# Patient Record
Sex: Male | Born: 1985 | Race: White | Hispanic: No | Marital: Single | Smoking: Current every day smoker
Health system: Southern US, Community
[De-identification: ages and names within clinical notes are randomized; demographics above are authoritative.]

## PROBLEM LIST (undated history)

## (undated) DIAGNOSIS — F329 Major depressive disorder, single episode, unspecified: Secondary | ICD-10-CM

## (undated) DIAGNOSIS — F32A Depression, unspecified: Secondary | ICD-10-CM

## (undated) DIAGNOSIS — F419 Anxiety disorder, unspecified: Secondary | ICD-10-CM

## (undated) DIAGNOSIS — F319 Bipolar disorder, unspecified: Secondary | ICD-10-CM

## (undated) DIAGNOSIS — M069 Rheumatoid arthritis, unspecified: Secondary | ICD-10-CM

## (undated) DIAGNOSIS — F4310 Post-traumatic stress disorder, unspecified: Secondary | ICD-10-CM

---

## 2009-05-05 ENCOUNTER — Emergency Department (HOSPITAL_COMMUNITY): Admission: EM | Admit: 2009-05-05 | Discharge: 2009-05-05 | Payer: Self-pay | Admitting: Emergency Medicine

## 2009-05-24 ENCOUNTER — Emergency Department (HOSPITAL_COMMUNITY): Admission: EM | Admit: 2009-05-24 | Discharge: 2009-05-24 | Payer: Self-pay | Admitting: Emergency Medicine

## 2009-05-27 ENCOUNTER — Emergency Department (HOSPITAL_COMMUNITY): Admission: EM | Admit: 2009-05-27 | Discharge: 2009-05-27 | Payer: Self-pay | Admitting: Emergency Medicine

## 2009-09-15 ENCOUNTER — Emergency Department (HOSPITAL_COMMUNITY): Admission: EM | Admit: 2009-09-15 | Discharge: 2009-09-15 | Payer: Self-pay | Admitting: Emergency Medicine

## 2010-12-01 LAB — COMPREHENSIVE METABOLIC PANEL
ALT: 16 U/L (ref 0–53)
Alkaline Phosphatase: 57 U/L (ref 39–117)
BUN: 11 mg/dL (ref 6–23)
CO2: 28 mEq/L (ref 19–32)
Chloride: 104 mEq/L (ref 96–112)
GFR calc non Af Amer: >60 mL/min (ref >60)
Glucose, Bld: 112 mg/dL — ABNORMAL HIGH (ref 70–99)
Potassium: 4.1 mEq/L (ref 3.5–5.1)
Sodium: 137 mEq/L (ref 135–145)
Total Bilirubin: 0.9 mg/dL (ref 0.3–1.2)

## 2010-12-01 LAB — DIFFERENTIAL
Eosinophils Relative: 2 % (ref 0–5)
Lymphocytes Relative: 24 % (ref 12–46)
Lymphs Abs: 2.2 10*3/uL (ref 0.7–4.0)
Monocytes Relative: 9 % (ref 3–12)
Neutrophils Relative %: 65 % (ref 43–77)

## 2010-12-01 LAB — URINE MICROSCOPIC-ADD ON

## 2010-12-01 LAB — CBC
HCT: 46 % (ref 39.0–52.0)
MCV: 94.7 fL (ref 78.0–100.0)
Platelets: 184 10*3/uL (ref 150–400)
RBC: 4.86 MIL/uL (ref 4.22–5.81)
WBC: 9.3 10*3/uL (ref 4.0–10.5)

## 2010-12-01 LAB — URINALYSIS, ROUTINE W REFLEX MICROSCOPIC
Hgb urine dipstick: NEGATIVE
Nitrite: NEGATIVE
Protein, ur: NEGATIVE mg/dL
Urobilinogen, UA: 0.2 mg/dL (ref 0.0–1.0)

## 2010-12-28 IMAGING — CT CT HEAD W/O CM
1 of 3 series · 16 of 30 positions shown, 20 images · non-contrast
Comparison: None

CLINICAL DATA: Dizziness and syncope.  Possible seizure.

CT HEAD WITHOUT CONTRAST
TECHNIQUE: Contiguous axial images were obtained from the base of
the skull through the vertex without contrast.

[Series 4: headseq 2.4 h60s · axial · 0.43mm/px · z∈[-176,-28]mm · 16 of 72 slices shown, 20 images]
[im 5/72  brain]
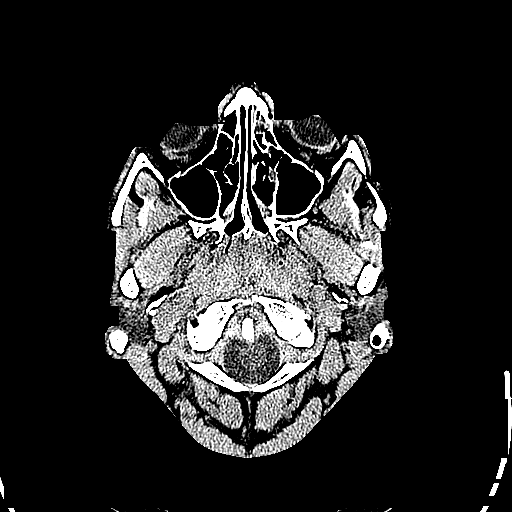
[im 5/72  bone]
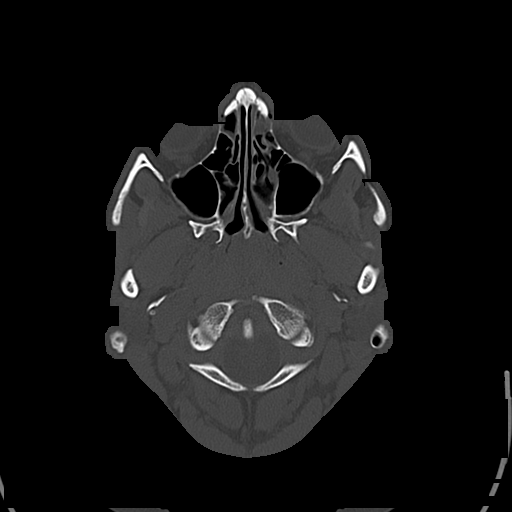
[im 9/72  brain]
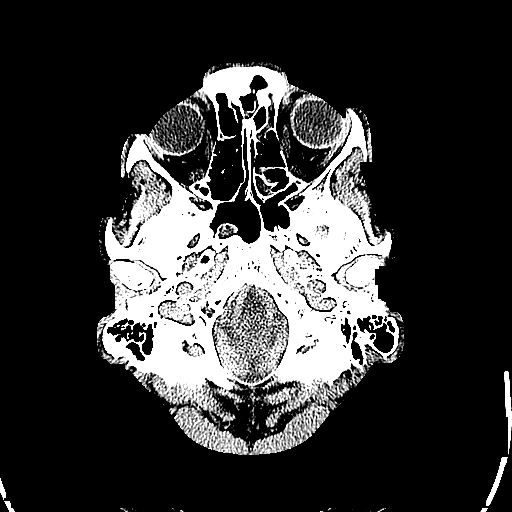
[im 13/72  brain]
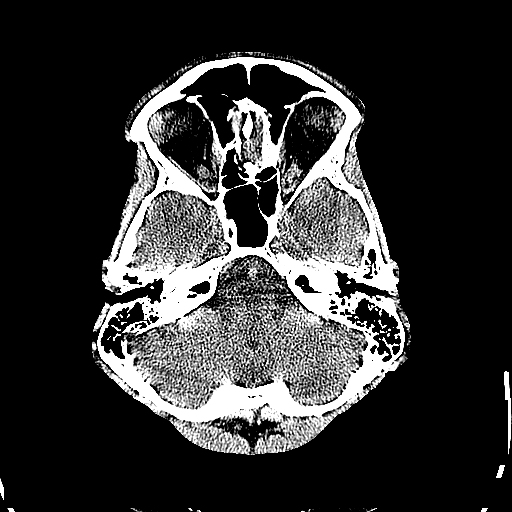
[im 17/72  brain]
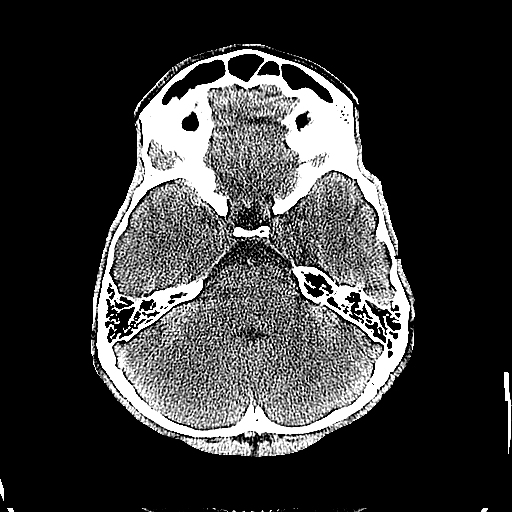
[im 21/72  brain]
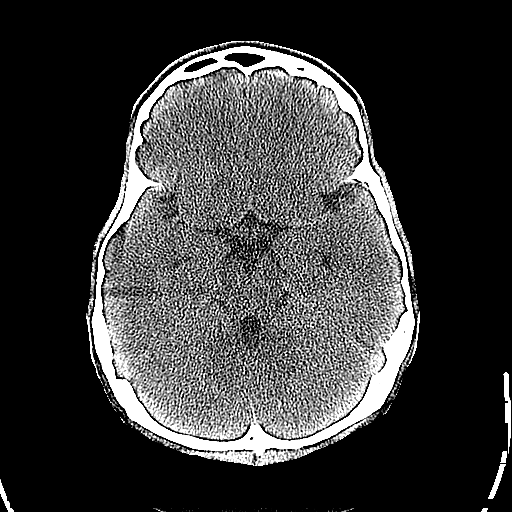
[im 21/72  bone]
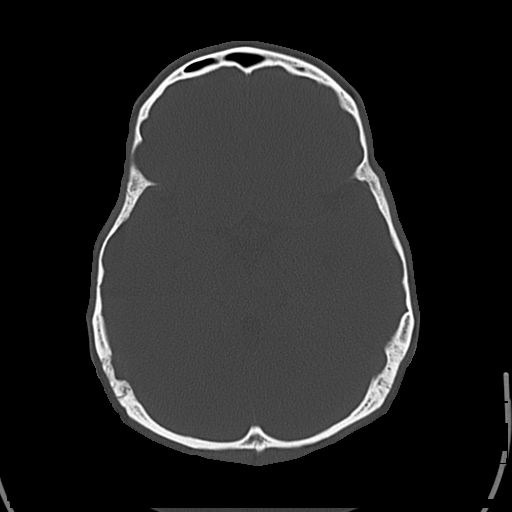
[im 26/72  brain]
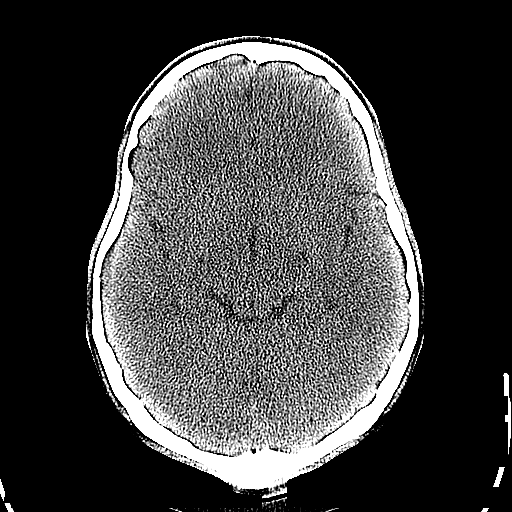
[im 30/72  brain]
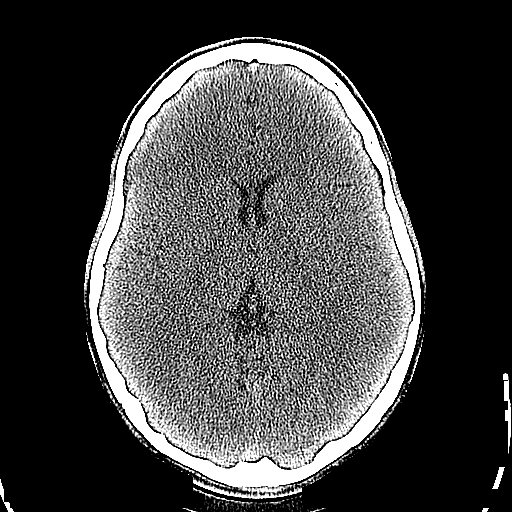
[im 34/72  brain]
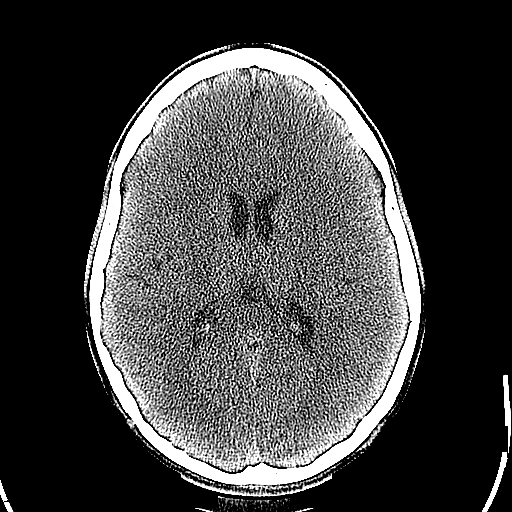
[im 38/72  brain]
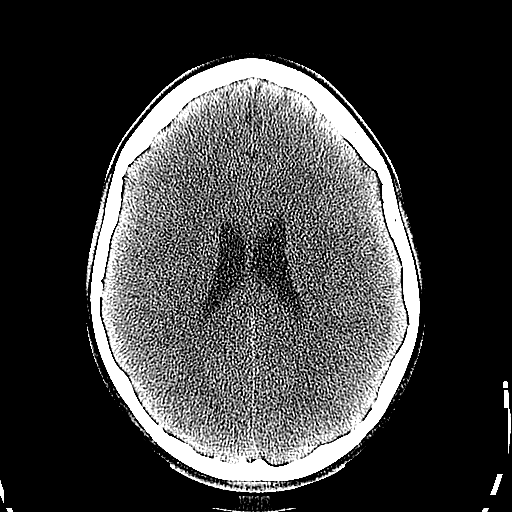
[im 38/72  bone]
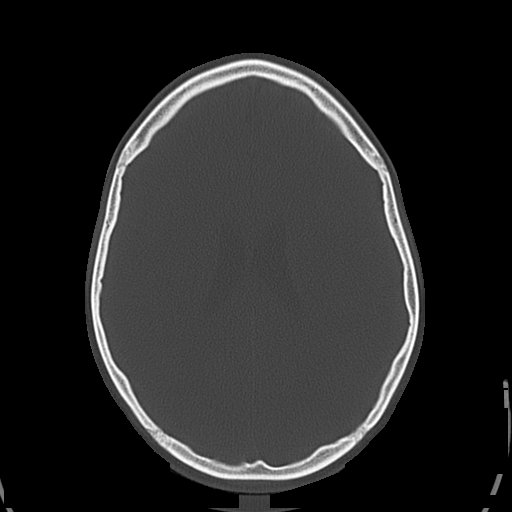
[im 42/72  brain]
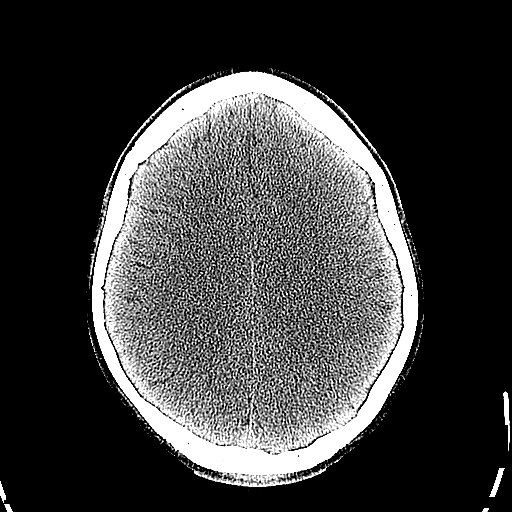
[im 46/72  brain]
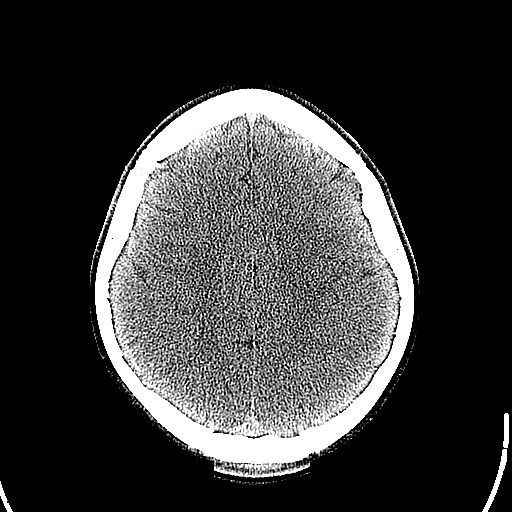
[im 51/72  brain]
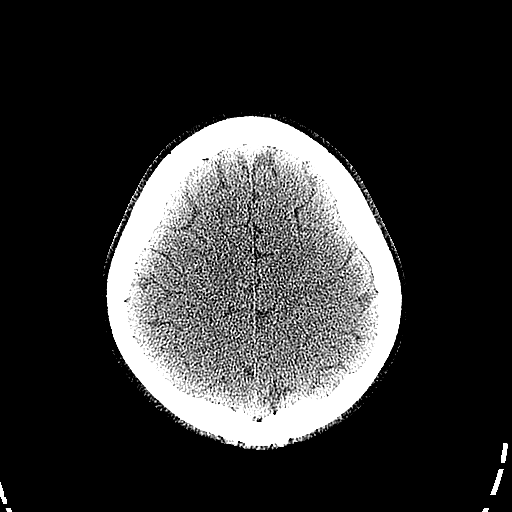
[im 55/72  brain]
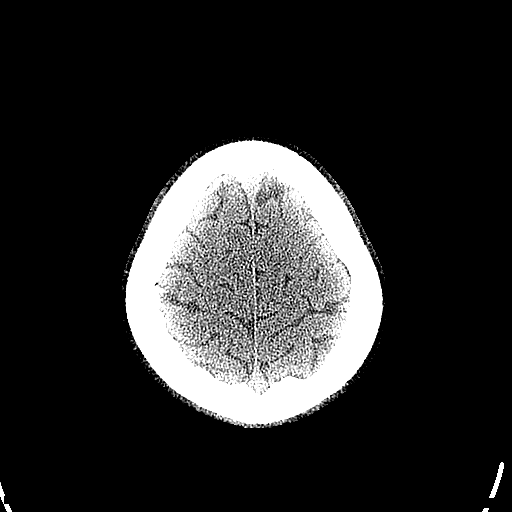
[im 55/72  bone]
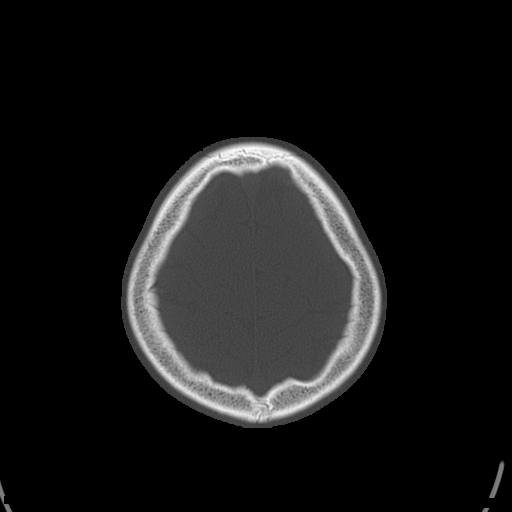
[im 59/72  brain]
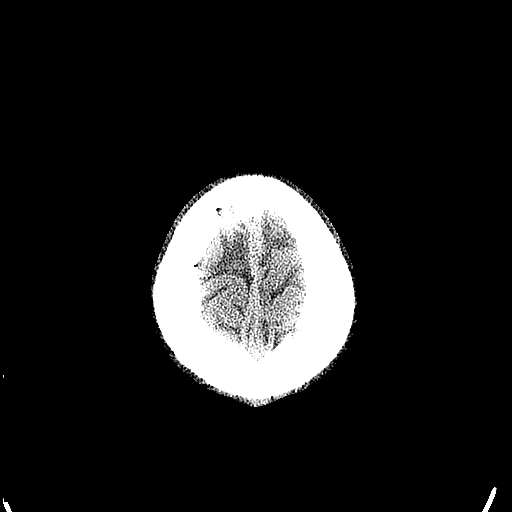
[im 63/72  brain]
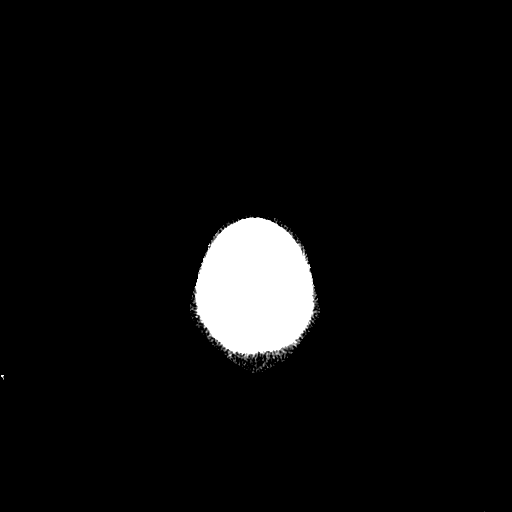
[im 67/72  brain]
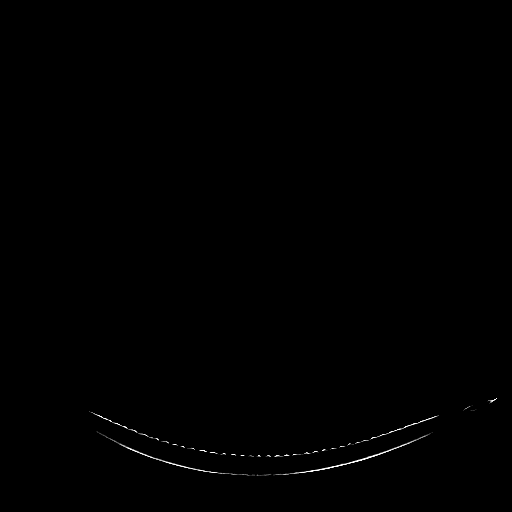

[16 of 30 positions shown; findings below may reference images not displayed]

FINDINGS: There is no evidence of acute intracranial hemorrhage,
mass lesion, brain edema or extra-axial fluid collection.  The
ventricles and subarachnoid spaces are appropriately sized for age.
There is no CT evidence of acute cortical infarction.

The visualized paranasal sinuses are clearaside from scattered
mucosal thickening in the ethmoid and right sphenoid sinuses.  The
calvarium is intact.
IMPRESSION: No acute intracranial findings.  Paranasal sinus mucosal
thickening.

## 2011-02-28 ENCOUNTER — Emergency Department (HOSPITAL_COMMUNITY)
Admission: EM | Admit: 2011-02-28 | Discharge: 2011-02-28 | Disposition: A | Discharge status: Home / Self Care | Payer: Self-pay | Attending: Emergency Medicine | Admitting: Emergency Medicine

## 2011-02-28 DIAGNOSIS — R599 Enlarged lymph nodes, unspecified: Secondary | ICD-10-CM | POA: Insufficient documentation

## 2011-02-28 DIAGNOSIS — K047 Periapical abscess without sinus: Secondary | ICD-10-CM | POA: Insufficient documentation

## 2012-03-20 ENCOUNTER — Emergency Department (HOSPITAL_COMMUNITY)
Admission: EM | Admit: 2012-03-20 | Discharge: 2012-03-20 | Disposition: A | Discharge status: Home / Self Care | Payer: Self-pay | Attending: Emergency Medicine | Admitting: Emergency Medicine

## 2012-03-20 ENCOUNTER — Encounter (HOSPITAL_COMMUNITY): Payer: Self-pay | Admitting: *Deleted

## 2012-03-20 DIAGNOSIS — F17200 Nicotine dependence, unspecified, uncomplicated: Secondary | ICD-10-CM | POA: Insufficient documentation

## 2012-03-20 DIAGNOSIS — F329 Major depressive disorder, single episode, unspecified: Secondary | ICD-10-CM

## 2012-03-20 DIAGNOSIS — X838XXA Intentional self-harm by other specified means, initial encounter: Secondary | ICD-10-CM

## 2012-03-20 DIAGNOSIS — R45851 Suicidal ideations: Secondary | ICD-10-CM

## 2012-03-20 DIAGNOSIS — F3289 Other specified depressive episodes: Secondary | ICD-10-CM

## 2012-03-20 DIAGNOSIS — M069 Rheumatoid arthritis, unspecified: Secondary | ICD-10-CM | POA: Insufficient documentation

## 2012-03-20 DIAGNOSIS — F32A Depression, unspecified: Secondary | ICD-10-CM

## 2012-03-20 HISTORY — DX: Rheumatoid arthritis, unspecified: M06.9

## 2012-03-20 HISTORY — DX: Anxiety disorder, unspecified: F41.9

## 2012-03-20 HISTORY — DX: Major depressive disorder, single episode, unspecified: F32.9

## 2012-03-20 HISTORY — DX: Depression, unspecified: F32.A

## 2012-03-20 LAB — URINALYSIS, ROUTINE W REFLEX MICROSCOPIC
Glucose, UA: NEGATIVE mg/dL
Hgb urine dipstick: NEGATIVE
Ketones, ur: NEGATIVE mg/dL
Leukocytes, UA: NEGATIVE
Nitrite: NEGATIVE
Protein, ur: NEGATIVE mg/dL
Specific Gravity, Urine: 1.031 — ABNORMAL HIGH (ref 1.005–1.030)
Urobilinogen, UA: 1 mg/dL (ref 0.0–1.0)
pH: 5.5 (ref 5.0–8.0)

## 2012-03-20 LAB — CBC
HCT: 48.4 % (ref 39.0–52.0)
Hemoglobin: 17 g/dL (ref 13.0–17.0)
MCH: 31.7 pg (ref 26.0–34.0)
MCHC: 35.1 g/dL (ref 30.0–36.0)
MCV: 90.1 fL (ref 78.0–100.0)
Platelets: 306 10*3/uL (ref 150–400)
RBC: 5.37 MIL/uL (ref 4.22–5.81)
RDW: 13.7 % (ref 11.5–15.5)
WBC: 8.3 10*3/uL (ref 4.0–10.5)

## 2012-03-20 LAB — BASIC METABOLIC PANEL
BUN: 10 mg/dL (ref 6–23)
CO2: 25 mEq/L (ref 19–32)
Calcium: 10.1 mg/dL (ref 8.4–10.5)
Chloride: 100 mEq/L (ref 96–112)
Creatinine, Ser: 0.97 mg/dL (ref 0.50–1.35)
GFR calc Af Amer: >90 mL/min (ref >90)
GFR calc non Af Amer: >90 mL/min (ref >90)
Glucose, Bld: 104 mg/dL — ABNORMAL HIGH (ref 70–99)
Potassium: 4.2 mEq/L (ref 3.5–5.1)
Sodium: 137 mEq/L (ref 135–145)

## 2012-03-20 LAB — RAPID URINE DRUG SCREEN, HOSP PERFORMED
Amphetamines: NOT DETECTED
Barbiturates: NOT DETECTED
Benzodiazepines: POSITIVE — AB
Cocaine: POSITIVE — AB
Opiates: NOT DETECTED
Tetrahydrocannabinol: POSITIVE — AB

## 2012-03-20 MED ORDER — LORAZEPAM 2 MG/ML IJ SOLN: 2.0000 mg | Freq: Once | INTRAMUSCULAR | Status: DC

## 2012-03-20 MED ORDER — ONDANSETRON HCL 4 MG PO TABS: 4.0000 mg | ORAL_TABLET | Freq: Three times a day (TID) | ORAL | Status: DC | PRN

## 2012-03-20 MED ORDER — NICOTINE 21 MG/24HR TD PT24
21.0000 mg | MEDICATED_PATCH | Freq: Every day | TRANSDERMAL | Status: DC
  Administered 2012-03-20: 21 mg via TRANSDERMAL
  Filled 2012-03-20: qty 1

## 2012-03-20 MED ORDER — ZOLPIDEM TARTRATE 5 MG PO TABS: 5.0000 mg | ORAL_TABLET | Freq: Every evening | ORAL | Status: DC | PRN

## 2012-03-20 MED ORDER — ACETAMINOPHEN 325 MG PO TABS: 650.0000 mg | ORAL_TABLET | ORAL | Status: DC | PRN

## 2012-03-20 MED ORDER — IBUPROFEN 600 MG PO TABS: 600.0000 mg | ORAL_TABLET | Freq: Three times a day (TID) | ORAL | Status: DC | PRN

## 2012-03-20 MED ORDER — LORAZEPAM 1 MG PO TABS
1.0000 mg | ORAL_TABLET | Freq: Three times a day (TID) | ORAL | Status: DC | PRN
  Administered 2012-03-20: 1 mg via ORAL
  Filled 2012-03-20: qty 1

## 2012-03-20 NOTE — ED Provider Notes (Signed)
History    26yM brought in by police. Got into argument with older brother and father today. Pt apparently threatened to kill himself and wrapped rope around neck. Father called police and they brought to ED. Pt denies this. Says no SI or HI. Says rope police brought with them was used to tie to lawnmower to keep the collection bag up. Hx of depression. Previously prescribed meds but has not been on in years because doesn't like the way it makes him feel. Previous care at [facility]. Very poor financial situation. Mother died in 17-Feb-2006. Occasional marijuana, otherwise denies drug use.   CSN: [id number]  Arrival date & time 03/20/12  4098   First MD Initiated Contact with Patient 03/20/12 [phone]      Chief Complaint  Patient presents with  . Medical Clearance    (Consider location/radiation/quality/duration/timing/severity/associated sxs/prior treatment) HPI  Past Medical History  Diagnosis Date  . Anxiety   . Depression   . Rheumatoid arthritis     History reviewed. No pertinent past surgical history.  No family history on file.  History  Substance Use Topics  . Smoking status: Current Everyday Smoker  . Smokeless tobacco: Not on file  . Alcohol Use: No      Review of Systems   Review of symptoms negative unless otherwise noted in HPI.  Allergies  Review of patient's allergies indicates no known allergies.  Home Medications  No current outpatient prescriptions on file.  BP 131/89  Pulse 87  Temp 98.4 F (36.9 C) (Oral)  Resp 18  SpO2 98%  Physical Exam  Nursing note and vitals reviewed. Constitutional: He appears well-developed and well-nourished. No distress.  HENT:  Head: Normocephalic and atraumatic.  Eyes: Conjunctivae are normal. Right eye exhibits no discharge. Left eye exhibits no discharge.  Neck: Neck supple.  Cardiovascular: Normal rate, regular rhythm and normal heart sounds.  Exam reveals no gallop and no friction rub.   No murmur  heard. Pulmonary/Chest: Effort normal and breath sounds normal. No respiratory distress.  Abdominal: Soft. He exhibits no distension. There is no tenderness.  Musculoskeletal: He exhibits no edema and no tenderness.  Neurological: He is alert.  Skin: Skin is warm and dry.  Psychiatric: His behavior is normal. Thought content normal.       Crying at times. Speech clear and content appropriate. Does not appear to be responding to internal stimuli. N apparent cognitive impairment.    ED Course  Procedures (including critical care time)   Labs Reviewed  CBC  BASIC METABOLIC PANEL  URINALYSIS, ROUTINE W REFLEX MICROSCOPIC  URINE RAPID DRUG SCREEN (HOSP PERFORMED)  ETHANOL   No results found.   1. Suicide gesture   2. Depression      MDM   Discussed with pt's father, Aser Nylund, with pt's permission([phone]). He states that he actually saw pt with rope around his neck. Police arrived with a rope fashioned in a noose that they found on scene.  Incident happened after a heated argument and suspect this was just an impulsive gesture.Despite pt's claims that no SI, concerning enough that pt will be placed under IVC at this time and will obtain psych eval.        Raeford Razor, MD 03/20/12 1023

## 2012-03-20 NOTE — Progress Notes (Signed)
Pt has been cleared by the unit psychiatrist for discharge home with outpatient resources. EDP was notified by RN who is in agreement with the disposition. CSW met with the pt to provide outpatient resources for substance abuse and mental health needs (resources included: [facility], mobile crisis services, therapists, [facility], [facility], [facility] and [facility]). No further needs identified at this time.

## 2012-03-20 NOTE — Consult Note (Signed)
Reason for Consult: Depression, and possible suicidal threat Referring Physician: Dr. Wyline Barker is an 26 y.o. male.  HPI: Patient was seen and chart reviewed. Patient was brought in by [facility] Department who for psychiatric evaluation. Reportedly patient threatened to kill himself and put a rope around his neck. Patient denies these accusations by his father. Patient had an argument and fight with his 58 years old Brother for gas money to go and get food pantry. Patient father pays all the bills and his medications from his disability check. Patient brother last his job during the month of March and he has been working part-time on and off for detail car cleaning and moving grass with friends. Patient reported that there is rope and noose used by a friend for mower. 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 Patient reported he has a family friend from  willing to help him, otherwise he is going to receive help from his the younger brother who was 57 years old, works in Psychologist, educational for the [employer]. Patient endorses being emotional, stressful and occasional use of drugs. He is willing to obtain psychosocial support from extended family and friends support.  Past Medical History  Diagnosis Date  . Anxiety   . Depression   . Rheumatoid arthritis     History reviewed. No pertinent past surgical history.  No family history on file.  Social History:  reports that he has been smoking.  He does not have any smokeless tobacco history on file. He reports that he uses illicit drugs (Marijuana). He reports that he does not drink alcohol.  Allergies: No Known Allergies  Medications: I have reviewed the patient's current medications.  Results for orders placed during the hospital encounter of 03/20/12 (from the past 48 hour(s))  CBC     Status: Normal   Collection Time   03/20/12  9:55 AM      Component Value Range Comment   WBC 8.3  4.0 - 10.5 K/uL    RBC 5.37  4.22 - 5.81 MIL/uL    Hemoglobin 17.0  13.0 - 17.0 g/dL    HCT 78.2  95.6 - 21.3 %    MCV 90.1  78.0 - 100.0 fL    MCH 31.7  26.0 - 34.0 pg    MCHC 35.1  30.0 - 36.0 g/dL    RDW 08.6  57.8 - 46.9 %    Platelets 306  150 - 400 K/uL   BASIC METABOLIC PANEL     Status: Abnormal   Collection Time   03/20/12  9:55 AM      Component Value Range Comment   Sodium 137  135 - 145 mEq/L    Potassium 4.2  3.5 - 5.1 mEq/L    Chloride 100  96 - 112 mEq/L    CO2 25  19 - 32 mEq/L    Glucose, Bld 104 (*) 70 - 99 mg/dL    BUN 10  6 - 23 mg/dL    Creatinine, Ser 6.29  0.50 - 1.35 mg/dL    Calcium 52.8  8.4 - 10.5 mg/dL    GFR calc non Af Amer >90  >90 mL/min    GFR calc Af Amer >90  >90 mL/min   URINALYSIS, ROUTINE W REFLEX MICROSCOPIC     Status: Abnormal   Collection Time   03/20/12 10:27 AM      Component Value  Range Comment   Color, Urine AMBER (*) YELLOW BIOCHEMICALS MAY BE AFFECTED BY COLOR   APPearance CLEAR  CLEAR    Specific Gravity, Urine 1.031 (*) 1.005 - 1.030    pH 5.5  5.0 - 8.0    Glucose, UA NEGATIVE  NEGATIVE mg/dL    Hgb urine dipstick NEGATIVE  NEGATIVE    Bilirubin Urine SMALL (*) NEGATIVE    Ketones, ur NEGATIVE  NEGATIVE mg/dL    Protein, ur NEGATIVE  NEGATIVE mg/dL    Urobilinogen, UA 1.0  0.0 - 1.0 mg/dL    Nitrite NEGATIVE  NEGATIVE    Leukocytes, UA NEGATIVE  NEGATIVE MICROSCOPIC NOT DONE ON URINES WITH NEGATIVE PROTEIN, BLOOD, LEUKOCYTES, NITRITE, OR GLUCOSE <1000 mg/dL.  URINE RAPID DRUG SCREEN (HOSP PERFORMED)     Status: Abnormal   Collection Time   03/20/12 10:27 AM      Component Value Range Comment   Opiates NONE DETECTED  NONE DETECTED    Cocaine POSITIVE (*) NONE DETECTED    Benzodiazepines POSITIVE (*) NONE DETECTED    Amphetamines NONE DETECTED  NONE DETECTED    Tetrahydrocannabinol POSITIVE (*) NONE DETECTED    Barbiturates NONE DETECTED  NONE DETECTED     ETHANOL     Status: Normal   Collection Time   03/20/12 12:15 PM      Component Value Range Comment   Alcohol, Ethyl (B) <11  0 - 11 mg/dL     No results found.  No psychosis and Positive for anxiety, bad Barker, depression and illegal drug usage Blood pressure 125/83, pulse 58, temperature 97.6 F (36.4 C), temperature source Oral, resp. rate 18, SpO2 100.00%.   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 Sibling relationship problems  Recommended outpatient psychiatric services and patient does not meet criteria for acute psychiatric hospitalization to get his does not current medications and the no recommendation was given during this visit  Steven Barker,JANARDHAHA R. 03/20/2012, 5:30 PM

## 2012-03-20 NOTE — ED Notes (Signed)
[employer] brought in noose that family reports pt tied to deck in an attempt to hang himself. Given to Juleen China, EDP, at bedside.

## 2012-03-20 NOTE — Progress Notes (Signed)
Initial encounter with pt. On nursing referral.    Pt lying in bed, lethargic.  Welcoming of chaplain presence.    Spoke with chaplain about desire to be discharged from ED.  When Chaplain asked pt about plans for discharge, Pt reported he had arranged for family friend to come from [place] to take pt to "a more peaceful, less stressful place."  Pt reported that he does not desire to harm himself.    Pt spoke with chaplain about unhappiness living in [place] and only staying to care for his grandmother, who he reports needs assistance with meals and daily living.  Pt reports that stresses in his home environment are great enough that he needs to leave and "they will have to find someone to help with that" (grandmother).    Belva Crome  MDiv, Chaplain    03/20/12 1400  Clinical Encounter Type  Visited With Patient  Visit Type Initial;Psychological support;Social support;Spiritual support;ED;Behavioral Health  Referral From Nurse  Consult/Referral To Nurse  Spiritual Encounters  Spiritual Needs Emotional  Stress Factors  Patient Stress Factors Family relationships

## 2012-03-20 NOTE — ED Notes (Signed)
Pt in by [employer], in handcuffs. Reports he got into an argument with his dad and brother over money and "they called the cops." Father reported to [employer] that pt was suicidal and tried to tie a rope around his neck and hang self from the deck. [employer] reports pt ran away before they arrived on scene so they did not witness this. Pt denies that that ever happened. "they have the wrong person, my dad and brother should be in here. I am not suicidal, do not want to hurt myself or others." Pt reports he used to go to [place] and has a Hx of SI.

## 2013-05-15 ENCOUNTER — Inpatient Hospital Stay (HOSPITAL_COMMUNITY)
Admission: AD | Admit: 2013-05-15 | Discharge: 2013-05-20 | DRG: 897 | Disposition: A | Discharge status: Home / Self Care | Payer: Federal, State, Local not specified - Other | Source: Intra-hospital | Attending: Psychiatry | Admitting: Psychiatry

## 2013-05-15 ENCOUNTER — Emergency Department (HOSPITAL_COMMUNITY)
Admission: EM | Admit: 2013-05-15 | Discharge: 2013-05-15 | Disposition: A | Discharge status: Other Acute Inpatient Hospital | Payer: Self-pay | Attending: Emergency Medicine | Admitting: Emergency Medicine

## 2013-05-15 ENCOUNTER — Encounter (HOSPITAL_COMMUNITY): Payer: Self-pay

## 2013-05-15 ENCOUNTER — Encounter (HOSPITAL_COMMUNITY): Payer: Self-pay | Admitting: Emergency Medicine

## 2013-05-15 DIAGNOSIS — F32A Depression, unspecified: Secondary | ICD-10-CM

## 2013-05-15 DIAGNOSIS — F323 Major depressive disorder, single episode, severe with psychotic features: Secondary | ICD-10-CM | POA: Diagnosis present

## 2013-05-15 DIAGNOSIS — R630 Anorexia: Secondary | ICD-10-CM | POA: Insufficient documentation

## 2013-05-15 DIAGNOSIS — F14120 Cocaine abuse with intoxication, uncomplicated: Secondary | ICD-10-CM

## 2013-05-15 DIAGNOSIS — F411 Generalized anxiety disorder: Secondary | ICD-10-CM | POA: Diagnosis present

## 2013-05-15 DIAGNOSIS — R45851 Suicidal ideations: Secondary | ICD-10-CM

## 2013-05-15 DIAGNOSIS — M083 Juvenile rheumatoid polyarthritis (seronegative): Secondary | ICD-10-CM | POA: Insufficient documentation

## 2013-05-15 DIAGNOSIS — G4700 Insomnia, unspecified: Secondary | ICD-10-CM | POA: Insufficient documentation

## 2013-05-15 DIAGNOSIS — F17200 Nicotine dependence, unspecified, uncomplicated: Secondary | ICD-10-CM | POA: Diagnosis present

## 2013-05-15 DIAGNOSIS — F333 Major depressive disorder, recurrent, severe with psychotic symptoms: Secondary | ICD-10-CM

## 2013-05-15 DIAGNOSIS — M069 Rheumatoid arthritis, unspecified: Secondary | ICD-10-CM | POA: Diagnosis present

## 2013-05-15 DIAGNOSIS — F3289 Other specified depressive episodes: Secondary | ICD-10-CM | POA: Insufficient documentation

## 2013-05-15 DIAGNOSIS — M0800 Unspecified juvenile rheumatoid arthritis of unspecified site: Secondary | ICD-10-CM

## 2013-05-15 DIAGNOSIS — F329 Major depressive disorder, single episode, unspecified: Secondary | ICD-10-CM

## 2013-05-15 DIAGNOSIS — F489 Nonpsychotic mental disorder, unspecified: Secondary | ICD-10-CM | POA: Insufficient documentation

## 2013-05-15 DIAGNOSIS — F419 Anxiety disorder, unspecified: Secondary | ICD-10-CM

## 2013-05-15 DIAGNOSIS — G479 Sleep disorder, unspecified: Secondary | ICD-10-CM | POA: Insufficient documentation

## 2013-05-15 DIAGNOSIS — F1994 Other psychoactive substance use, unspecified with psychoactive substance-induced mood disorder: Secondary | ICD-10-CM | POA: Diagnosis present

## 2013-05-15 DIAGNOSIS — F919 Conduct disorder, unspecified: Secondary | ICD-10-CM | POA: Insufficient documentation

## 2013-05-15 DIAGNOSIS — IMO0002 Reserved for concepts with insufficient information to code with codable children: Secondary | ICD-10-CM | POA: Insufficient documentation

## 2013-05-15 DIAGNOSIS — F1210 Cannabis abuse, uncomplicated: Secondary | ICD-10-CM

## 2013-05-15 DIAGNOSIS — F1910 Other psychoactive substance abuse, uncomplicated: Principal | ICD-10-CM | POA: Diagnosis present

## 2013-05-15 DIAGNOSIS — R45850 Homicidal ideations: Secondary | ICD-10-CM | POA: Insufficient documentation

## 2013-05-15 LAB — CBC
MCHC: 34.8 g/dL (ref 30.0–36.0)
MCV: 91.4 fL (ref 78.0–100.0)
Platelets: 266 10*3/uL (ref 150–400)
RDW: 13.4 % (ref 11.5–15.5)
WBC: 8.9 10*3/uL (ref 4.0–10.5)

## 2013-05-15 LAB — RAPID URINE DRUG SCREEN, HOSP PERFORMED
Amphetamines: NOT DETECTED
Benzodiazepines: POSITIVE — AB
Cocaine: POSITIVE — AB
Opiates: NOT DETECTED

## 2013-05-15 LAB — COMPREHENSIVE METABOLIC PANEL
AST: 23 U/L (ref 0–37)
Albumin: 3.8 g/dL (ref 3.5–5.2)
Calcium: 9.5 mg/dL (ref 8.4–10.5)
Chloride: 101 mEq/L (ref 96–112)
Creatinine, Ser: 0.88 mg/dL (ref 0.50–1.35)
Total Bilirubin: 0.2 mg/dL — ABNORMAL LOW (ref 0.3–1.2)
Total Protein: 6.9 g/dL (ref 6.0–8.3)

## 2013-05-15 LAB — ETHANOL: Alcohol, Ethyl (B): <11 mg/dL (ref 0–11)

## 2013-05-15 LAB — ACETAMINOPHEN LEVEL: Acetaminophen (Tylenol), Serum: <15 ug/mL (ref 10–30)

## 2013-05-15 LAB — SALICYLATE LEVEL: Salicylate Lvl: <2 mg/dL — ABNORMAL LOW (ref 2.8–20.0)

## 2013-05-15 MED ORDER — MAGNESIUM HYDROXIDE 400 MG/5ML PO SUSP: 30.0000 mL | Freq: Every day | ORAL | Status: DC | PRN

## 2013-05-15 MED ORDER — ALUM & MAG HYDROXIDE-SIMETH 200-200-20 MG/5ML PO SUSP: 30.0000 mL | ORAL | Status: DC | PRN

## 2013-05-15 MED ORDER — QUETIAPINE FUMARATE 25 MG PO TABS
25.0000 mg | ORAL_TABLET | Freq: Every day | ORAL | Status: DC
  Administered 2013-05-15 – 2013-05-16 (×2): 25 mg via ORAL
  Filled 2013-05-15 (×2): qty 1

## 2013-05-15 MED ORDER — ALUM & MAG HYDROXIDE-SIMETH 200-200-20 MG/5ML PO SUSP
30.0000 mL | ORAL | Status: DC | PRN
  Filled 2013-05-15: qty 30

## 2013-05-15 MED ORDER — NICOTINE 21 MG/24HR TD PT24
21.0000 mg | MEDICATED_PATCH | Freq: Every day | TRANSDERMAL | Status: DC
  Administered 2013-05-15 (×2): 21 mg via TRANSDERMAL
  Filled 2013-05-15 (×3): qty 1

## 2013-05-15 MED ORDER — LORAZEPAM 1 MG PO TABS
2.0000 mg | ORAL_TABLET | Freq: Once | ORAL | Status: AC
  Administered 2013-05-15: 2 mg via ORAL
  Filled 2013-05-15: qty 2

## 2013-05-15 MED ORDER — ZOLPIDEM TARTRATE 5 MG PO TABS: 5.0000 mg | ORAL_TABLET | Freq: Every evening | ORAL | Status: DC | PRN

## 2013-05-15 MED ORDER — MELOXICAM 7.5 MG PO TABS
7.5000 mg | ORAL_TABLET | Freq: Every day | ORAL | Status: DC
  Administered 2013-05-15 – 2013-05-20 (×6): 7.5 mg via ORAL
  Filled 2013-05-15 (×8): qty 1

## 2013-05-15 MED ORDER — HYDROXYZINE HCL 50 MG PO TABS
50.0000 mg | ORAL_TABLET | Freq: Four times a day (QID) | ORAL | Status: DC | PRN
  Administered 2013-05-16 – 2013-05-18 (×2): 50 mg via ORAL
  Filled 2013-05-15 (×2): qty 1

## 2013-05-15 MED ORDER — LORAZEPAM 1 MG PO TABS
2.0000 mg | ORAL_TABLET | Freq: Every evening | ORAL | Status: AC
  Administered 2013-05-15: 2 mg via ORAL
  Filled 2013-05-15: qty 2

## 2013-05-15 MED ORDER — LORAZEPAM 1 MG PO TABS
ORAL_TABLET | ORAL | Status: AC
  Administered 2013-05-15: 1 mg
  Filled 2013-05-15: qty 1

## 2013-05-15 MED ORDER — CLONAZEPAM 0.5 MG PO TABS
0.5000 mg | ORAL_TABLET | Freq: Two times a day (BID) | ORAL | Status: DC
  Administered 2013-05-15 – 2013-05-20 (×10): 0.5 mg via ORAL
  Filled 2013-05-15 (×10): qty 1

## 2013-05-15 MED ORDER — IBUPROFEN 200 MG PO TABS
600.0000 mg | ORAL_TABLET | Freq: Three times a day (TID) | ORAL | Status: DC | PRN
  Administered 2013-05-15: 600 mg via ORAL
  Filled 2013-05-15: qty 3

## 2013-05-15 MED ORDER — ACETAMINOPHEN 325 MG PO TABS
650.0000 mg | ORAL_TABLET | Freq: Four times a day (QID) | ORAL | Status: DC | PRN
  Administered 2013-05-15 – 2013-05-19 (×5): 650 mg via ORAL
  Filled 2013-05-15: qty 2

## 2013-05-15 MED ORDER — IBUPROFEN 600 MG PO TABS
600.0000 mg | ORAL_TABLET | Freq: Three times a day (TID) | ORAL | Status: DC | PRN
  Administered 2013-05-16 – 2013-05-20 (×6): 600 mg via ORAL
  Filled 2013-05-15 (×7): qty 1

## 2013-05-15 MED ORDER — LORAZEPAM 1 MG PO TABS
1.0000 mg | ORAL_TABLET | Freq: Three times a day (TID) | ORAL | Status: DC | PRN
  Administered 2013-05-15: 1 mg via ORAL

## 2013-05-15 MED ORDER — NICOTINE 21 MG/24HR TD PT24
21.0000 mg | MEDICATED_PATCH | Freq: Every day | TRANSDERMAL | Status: DC
  Administered 2013-05-16 – 2013-05-20 (×5): 21 mg via TRANSDERMAL
  Filled 2013-05-15 (×6): qty 1

## 2013-05-15 MED ORDER — HYDROXYZINE HCL 25 MG PO TABS
50.0000 mg | ORAL_TABLET | Freq: Four times a day (QID) | ORAL | Status: DC | PRN
  Administered 2013-05-15: 50 mg via ORAL
  Filled 2013-05-15: qty 2

## 2013-05-15 MED ORDER — ONDANSETRON HCL 4 MG PO TABS: 4.0000 mg | ORAL_TABLET | Freq: Three times a day (TID) | ORAL | Status: DC | PRN

## 2013-05-15 MED ORDER — LORAZEPAM 1 MG PO TABS: 1.0000 mg | ORAL_TABLET | Freq: Three times a day (TID) | ORAL | Status: DC | PRN

## 2013-05-15 NOTE — ED Notes (Signed)
Pt arrived to Ed with a complaint of suicidal ideations.  Pt states that he has a plan to hang himself.  Pt is he with his father.  Pt's father states that the pt has juvinile rheumatoid arthritis and the pain is causing him depression.  Pt has a history of depression and suicidal indent

## 2013-05-15 NOTE — ED Notes (Signed)
Pt wanded by security.  Pt's belongings searched.

## 2013-05-15 NOTE — Tx Team (Signed)
Initial Interdisciplinary Treatment Plan  PATIENT STRENGTHS: (choose at least two) Ability for insight General fund of knowledge Motivation for treatment/growth  PATIENT STRESSORS: Financial difficulties Legal issue   PROBLEM LIST: Problem List/Patient Goals Date to be addressed Date deferred Reason deferred Estimated date of resolution  Suicidal Ideation 05/15/2013     Anxiety 05/15/2013     Depression 05/15/2013     Anger 05/15/2013                                    DISCHARGE CRITERIA:  Improved stabilization in mood, thinking, and/or behavior  PRELIMINARY DISCHARGE PLAN: Outpatient therapy  PATIENT/FAMIILY INVOLVEMENT: This treatment plan has been presented to and reviewed with the patient, Steven Barker, and/or family member.  The patient and family have been given the opportunity to ask questions and make suggestions.  Gretta Arab [facility] 05/15/2013, 12:45 PM

## 2013-05-15 NOTE — ED Provider Notes (Signed)
Medical screening examination/treatment/procedure(s) were performed by non-physician practitioner and as supervising physician I was immediately available for consultation/collaboration.  Monicia Tse M Nekayla Heider, MD 05/15/13 0604 

## 2013-05-15 NOTE — ED Notes (Signed)
Pt. States that he is "going to go off", explained what would happen if he does that and spent time dicussing pt.'s feelings, states that when he wakes up, his mind is just racing.  Informed Dr., new orders given.

## 2013-05-15 NOTE — ED Notes (Signed)
Pt. And belongings searched and wanded by security. Pt.has 1 belongings bag. Pt. Has pants, shirt, wallet, shoes and belt. Pt. Belongings locked up at  the nurses station in triage.

## 2013-05-15 NOTE — ED Provider Notes (Signed)
CSN: [id number]     Arrival date & time 05/15/13  0057 History   First MD Initiated Contact with Patient 05/15/13 0116     Chief Complaint  Patient presents with  . Medical Clearance   (Consider location/radiation/quality/duration/timing/severity/associated sxs/prior Treatment) The history is provided by the patient and medical records. No language interpreter was used.    Steven Barker is a 27 y.o. male  with a hx of anxiety, depression, suicide attempts via hanging, JRA presents to the Emergency Department complaining of gradual, persistent, progressively worsening suicidal ideations with a plan to hang himself onset many months ago.  Pt reports he feels this way everyday.  Tonight he woke his father requesting help and was holding a rope. Associated symptoms include explosive episodes of anger, insomnia and decreased appetite.  He also endorses auditory and visual hallucinations after being awake for 5-6 days.  He reports it resolved after getting some sleep.  He denies previous episodes of this.  Father states increasing RA symptoms which have caused him to stop working and aggravated the depression.  Pt reports chronic joint pain but denies all other somatic symptoms.     Past Medical History  Diagnosis Date  . Anxiety   . Depression   . Rheumatoid arthritis(714.0)    History reviewed. No pertinent past surgical history. History reviewed. No pertinent family history. History  Substance Use Topics  . Smoking status: Current Every Day Smoker  . Smokeless tobacco: Not on file  . Alcohol Use: No    Review of Systems  Constitutional: Negative for fever, diaphoresis, appetite change, fatigue and unexpected weight change.  HENT: Negative for mouth sores and neck stiffness.   Eyes: Negative for visual disturbance.  Respiratory: Negative for cough, chest tightness, shortness of breath and wheezing.   Cardiovascular: Negative for chest pain.  Gastrointestinal: Negative for nausea,  vomiting, abdominal pain, diarrhea and constipation.  Endocrine: Negative for polydipsia, polyphagia and polyuria.  Genitourinary: Negative for dysuria, urgency, frequency and hematuria.  Musculoskeletal: Positive for arthralgias (chronic). Negative for back pain.  Skin: Negative for rash.  Allergic/Immunologic: Negative for immunocompromised state.  Neurological: Negative for syncope, light-headedness and headaches.  Hematological: Does not bruise/bleed easily.  Psychiatric/Behavioral: Positive for suicidal ideas, behavioral problems, sleep disturbance, decreased concentration and agitation. The patient is nervous/anxious.     Allergies  Clindamycin/lincomycin  Home Medications  No current outpatient prescriptions on file. BP 118/77  Pulse 79  Temp(Src) 98.2 F (36.8 C) (Oral)  Resp 16  SpO2 98% Physical Exam  Nursing note and vitals reviewed. Constitutional: He is oriented to person, place, and time. He appears well-developed and well-nourished. No distress.  Awake, alert, nontoxic appearance  HENT:  Head: Normocephalic and atraumatic.  Mouth/Throat: Oropharynx is clear and moist. No oropharyngeal exudate.  Eyes: Conjunctivae are normal. Pupils are equal, round, and reactive to light. No scleral icterus.  Neck: Normal range of motion. Neck supple.  Cardiovascular: Normal rate, regular rhythm, normal heart sounds and intact distal pulses.   No murmur heard. Pulmonary/Chest: Effort normal and breath sounds normal. No respiratory distress. He has no wheezes. He has no rales.  Abdominal: Soft. Bowel sounds are normal. He exhibits no distension. There is no tenderness. There is no rebound and no guarding.  Musculoskeletal: Normal range of motion. He exhibits no edema.  Lymphadenopathy:    He has no cervical adenopathy.  Neurological: He is alert and oriented to person, place, and time. He exhibits normal muscle tone. Coordination normal.  Speech is clear  and goal oriented Moves  extremities without ataxia  Skin: Skin is warm and dry. No rash noted. He is not diaphoretic. No erythema.  Psychiatric: His mood appears anxious. His affect is angry and labile. His speech is rapid and/or pressured. He is agitated, hyperactive and actively hallucinating (now resolved). He expresses homicidal and suicidal ideation. He expresses suicidal plans. He expresses no homicidal plans.  Pt tearful, agitated and pacing in the room    ED Course  Procedures (including critical care time) Labs Review Labs Reviewed  COMPREHENSIVE METABOLIC PANEL - Abnormal; Notable for the following:    Total Bilirubin 0.2 (*)    All other components within normal limits  SALICYLATE LEVEL - Abnormal; Notable for the following:    Salicylate Lvl <2.0 (*)    All other components within normal limits  URINE RAPID DRUG SCREEN (HOSP PERFORMED) - Abnormal; Notable for the following:    Cocaine POSITIVE (*)    Benzodiazepines POSITIVE (*)    Tetrahydrocannabinol POSITIVE (*)    All other components within normal limits  ACETAMINOPHEN LEVEL  CBC  ETHANOL   Imaging Review No results found.  MDM   1. Anxiety   2. Depression   3. JRA (juvenile rheumatoid arthritis)   4. Suicidal ideations    Chrishon Martino presents with suicidal ideations and a plan to himself. Patient with a history of same and previous suicide attempts. I believe the patient's risk of suicide attempt as high. He is currently here on voluntary only however patient will need to be involuntarily committed if he chooses to leave.  UDS positive for cocaine, benzodiazepines and marijuana. Other labs unremarkable. Patient is otherwise clear to move to behavioral health.  Patient agitated, tearful and pacing in the room. We'll give Ativan 2 mg.  Patient pending and ACT/psych evaluation for inpatient versus outpatient treatment options.   Dahlia Client Brett Soza, PA-C 05/15/13 [phone]

## 2013-05-15 NOTE — Progress Notes (Signed)
Pt completed support paperwork will be transported by secruity voluntarily. Pt asked, "Will this mess me up getting a gun?" CSW asked why he wanted to buy a gun. Pt explained for hunting and home protection.   Catha Gosselin, LCSW [phone]  ED CSW .05/15/2013 1132am

## 2013-05-15 NOTE — ED Notes (Signed)
Report called to [place], Charity fundraiser at [facility].  Pt. To go to room 503.  Pt. Ambulated out of psych ed without any difficulty.  Escorted and transported to [facility] by [employer]/NT..  Pt.'s 1 bag of belongings taken to [facility] with pt. By NT.

## 2013-05-15 NOTE — ED Notes (Signed)
Pt transferred from triage, presents with complaint of SI, plan to hang self, pt admits to attempting twice in the past.  Reports he feels hopeless. Denies HI or AV hallucinations at present. Pt states he has followed up with [facility] in the past, diag. With Depression & Anxiety.  Pt cooperative but anxious at present.  Pt pacing in his room.
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
## 2013-05-15 NOTE — Consult Note (Signed)
[facility] Face-to-Face Psychiatry Consult   Reason for Consult:  Steven Steven Barker was making threats to kill himself after trying to hang himself a few months ago. Referring Physician:  ER MD  Steven Barker is an 27 y.o. male.  Assessment: AXIS I:  Major Depression, Recurrent severe AXIS II:  Deferred AXIS III:   Past Medical History  Diagnosis Date  . Anxiety   . Depression   . Rheumatoid arthritis(714.0)    AXIS IV:  economic problems, educational problems, occupational problems and problems related to social environment AXIS V:  11-20 some danger of hurting self or others possible OR occasionally fails to maintain minimal personal hygiene OR gross impairment in communication  Plan:  Recommend psychiatric Inpatient admission when medically cleared.  Subjective:   Steven Barker is a 27 y.o. male patient admitted with suicidal thinking.  HPI:  Steven Barker says he has been depressed and anxious for many years.  He came to his father last night saying he could not take it anymore.  His father is also being admitted to inpatient today for depression.  He reportedly tried to hang himself several months ago but his father found him, and was threatening to kill himself again last night holding a rope in his hand,reportedly.  Now he wants to go home saying he is no longer suicidal.  His anxiety is to the point of not being able to leave the house, he says and drives him to explode which is part of his behavior in the ER.  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  Cannot afford the medicine to treat it since his mother died in 01-07-2006.  He lives with his father, brother and grandmother who is disabled herself. HPI Elements:   Location:  ER. Quality:  suicidal. Severity:  severe. Timing:  last few years have been worse. Duration:  7 plus years. Context:  many physical,death of mother,and other family issues.  Past Psychiatric History: Past Medical History  Diagnosis  Date  . Anxiety   . Depression   . Rheumatoid arthritis(714.0)     reports that he has been smoking.  He does not have any smokeless tobacco history on file. He reports that he uses illicit drugs (Marijuana). He reports that he does not drink alcohol. History reviewed. No pertinent family history. Family History Substance Abuse: No Family Supports: Yes, List: Living Arrangements: Parent Can pt return to current living arrangement?: Yes   Allergies:   Allergies  Allergen Reactions  . Clindamycin/Lincomycin Nausea And Vomiting    Patient stated that he felt like he was going to die    ACT Assessment Complete:  Yes:    Educational Status    Risk to Self: Risk to self Suicidal Ideation: Yes-Currently Present Suicidal Intent: Yes-Currently Present Is patient at risk for suicide?: Yes Suicidal Plan?: Yes-Currently Present Specify Current Suicidal Plan: hang himself Access to Means: Yes Specify Access to Suicidal Means: rope What has been your use of drugs/alcohol within the last 12 months?: none reported Previous Attempts/Gestures: Yes How many times?: 1 Other Self Harm Risks: none Triggers for Past Attempts: Family contact;Anniversary (mother died in 01/07/06) Intentional Self Injurious Behavior: None Family Suicide History: No Recent stressful life event(s):  (Death of his mother) Persecutory voices/beliefs?: No Depression: Yes Depression Symptoms: Feeling worthless/self pity;Loss of interest in usual pleasures;Isolating;Insomnia Substance abuse history and/or treatment for substance abuse?: No Suicide prevention information given to non-admitted patients: Yes  Risk to Others: Risk to  Others Homicidal Ideation: No Thoughts of Harm to Others: No Current Homicidal Intent: No Current Homicidal Plan: No Access to Homicidal Means: No Identified Victim: none reported History of harm to others?: No Assessment of Violence: None Noted Violent Behavior Description: calm Does  patient have access to weapons?: No Criminal Charges Pending?: Yes Describe Pending Criminal Charges: open container Does patient have a court date: Yes Court Date: 07/06/13  Abuse:    Prior Inpatient Therapy: Prior Inpatient Therapy Prior Inpatient Therapy: Yes Prior Therapy Dates: 2014 Prior Therapy Facilty/Provider(s): [facility] Reason for Treatment: depression   Prior Outpatient Therapy: Prior Outpatient Therapy Prior Outpatient Therapy: Yes Prior Therapy Dates: ongoing  Prior Therapy Facilty/Provider(s): [facility]  Reason for Treatment: medication management   Additional Information: Additional Information 1:1 In Past 12 Months?: No CIRT Risk: No Elopement Risk: No Does patient have medical clearance?: Yes                  Objective: Blood pressure 122/80, pulse 65, temperature 98 F (36.7 C), temperature source Oral, resp. rate 18, SpO2 100.00%.There is no height or weight on file to calculate BMI. Results for orders placed during the hospital encounter of 05/15/13 (from the past 72 hour(s))  URINE RAPID DRUG SCREEN (HOSP PERFORMED)     Status: Abnormal   Collection Time    05/15/13  1:21 AM      Result Value Range   Opiates NONE DETECTED  NONE DETECTED   Cocaine POSITIVE (*) NONE DETECTED   Benzodiazepines POSITIVE (*) NONE DETECTED   Amphetamines NONE DETECTED  NONE DETECTED   Tetrahydrocannabinol POSITIVE (*) NONE DETECTED   Barbiturates NONE DETECTED  NONE DETECTED   Comment:            DRUG SCREEN FOR MEDICAL PURPOSES     ONLY.  IF CONFIRMATION IS NEEDED     FOR ANY PURPOSE, NOTIFY LAB     WITHIN 5 DAYS.                LOWEST DETECTABLE LIMITS     FOR URINE DRUG SCREEN     Drug Class       Cutoff (ng/mL)     Amphetamine      1000     Barbiturate      200     Benzodiazepine   200     Tricyclics       300     Opiates          300     Cocaine          300     THC              50  ACETAMINOPHEN LEVEL     Status: None   Collection Time    05/15/13   1:25 AM      Result Value Range   Acetaminophen (Tylenol), Serum <15.0  10 - 30 ug/mL   Comment:            THERAPEUTIC CONCENTRATIONS VARY     SIGNIFICANTLY. A RANGE OF 10-30     ug/mL MAY BE AN EFFECTIVE     CONCENTRATION FOR MANY PATIENTS.     HOWEVER, SOME ARE BEST TREATED     AT CONCENTRATIONS OUTSIDE THIS     RANGE.     ACETAMINOPHEN CONCENTRATIONS     >150 ug/mL AT 4 HOURS AFTER     INGESTION AND >50 ug/mL AT 12     HOURS AFTER INGESTION  ARE     OFTEN ASSOCIATED WITH TOXIC     REACTIONS.  CBC     Status: None   Collection Time    05/15/13  1:25 AM      Result Value Range   WBC 8.9  4.0 - 10.5 K/uL   RBC 4.75  4.22 - 5.81 MIL/uL   Hemoglobin 15.1  13.0 - 17.0 g/dL   HCT 16.1  09.6 - 04.5 %   MCV 91.4  78.0 - 100.0 fL   MCH 31.8  26.0 - 34.0 pg   MCHC 34.8  30.0 - 36.0 g/dL   RDW 40.9  81.1 - 91.4 %   Platelets 266  150 - 400 K/uL  COMPREHENSIVE METABOLIC PANEL     Status: Abnormal   Collection Time    05/15/13  1:25 AM      Result Value Range   Sodium 138  135 - 145 mEq/L   Potassium 3.9  3.5 - 5.1 mEq/L   Chloride 101  96 - 112 mEq/L   CO2 29  19 - 32 mEq/L   Glucose, Bld 88  70 - 99 mg/dL   BUN 11  6 - 23 mg/dL   Creatinine, Ser 7.82  0.50 - 1.35 mg/dL   Calcium 9.5  8.4 - 95.6 mg/dL   Total Protein 6.9  6.0 - 8.3 g/dL   Albumin 3.8  3.5 - 5.2 g/dL   AST 23  0 - 37 U/L   ALT 31  0 - 53 U/L   Alkaline Phosphatase 89  39 - 117 U/L   Total Bilirubin 0.2 (*) 0.3 - 1.2 mg/dL   GFR calc non Af Amer >90  >90 mL/min   GFR calc Af Amer >90  >90 mL/min   Comment: (NOTE)     The eGFR has been calculated using the CKD EPI equation.     This calculation has not been validated in all clinical situations.     eGFR's persistently <90 mL/min signify possible Chronic Kidney     Disease.  ETHANOL     Status: None   Collection Time    05/15/13  1:25 AM      Result Value Range   Alcohol, Ethyl (B) <11  0 - 11 mg/dL   Comment:            LOWEST DETECTABLE LIMIT FOR      SERUM ALCOHOL IS 11 mg/dL     FOR MEDICAL PURPOSES ONLY  SALICYLATE LEVEL     Status: Abnormal   Collection Time    05/15/13  1:25 AM      Result Value Range   Salicylate Lvl <2.0 (*) 2.8 - 20.0 mg/dL   Labs are reviewed and are pertinent for no psychiatric issue.  Current Facility-Administered Medications  Medication Dose Route Frequency Provider Last Rate Last Dose  . alum & mag hydroxide-simeth (MAALOX/MYLANTA) 200-200-20 MG/5ML suspension 30 mL  30 mL Oral PRN Hannah Muthersbaugh, PA-C      . hydrOXYzine (ATARAX/VISTARIL) tablet 50 mg  50 mg Oral Q6H PRN Audrea Muscat, NP   50 mg at 05/15/13 0250  . ibuprofen (ADVIL,MOTRIN) tablet 600 mg  600 mg Oral Q8H PRN Hannah Muthersbaugh, PA-C   600 mg at 05/15/13 0754  . LORazepam (ATIVAN) tablet 1 mg  1 mg Oral Q8H PRN Hannah Muthersbaugh, PA-C      . nicotine (NICODERM CQ - dosed in mg/24 hours) patch 21 mg  21 mg Transdermal Daily Dahlia Client  Muthersbaugh, PA-C   21 mg at 05/15/13 0908  . ondansetron (ZOFRAN) tablet 4 mg  4 mg Oral Q8H PRN Hannah Muthersbaugh, PA-C      . zolpidem (AMBIEN) tablet 5 mg  5 mg Oral QHS PRN Dahlia Client Muthersbaugh, PA-C       No current outpatient prescriptions on file.    Psychiatric Specialty Exam:     Blood pressure 122/80, pulse 65, temperature 98 F (36.7 C), temperature source Oral, resp. rate 18, SpO2 100.00%.There is no height or weight on file to calculate BMI.  General Appearance: Casual  Eye Contact::  Good  Speech:  Normal Rate  Volume:  Normal  Mood:  Depressed and Irritable  Affect:  Appropriate  Thought Process:  Goal Directed and Logical  Orientation:  Full (Time, Place, and Person)  Thought Content:  Negative  Suicidal Thoughts:  Yes.  with intent/plan  Homicidal Thoughts:  No  Memory:  Immediate;   Good Recent;   Good Remote;   Good  Judgement:  Intact  Insight:  Shallow  Psychomotor Activity:  Normal  Concentration:  Good  Recall:  Good  Akathisia:  Negative  Handed:  Right   AIMS (if indicated):     Assets:  Communication Skills Housing  Sleep:      Treatment Plan Summary: Daily contact with patient to assess and evaluate symptoms and progress in treatment Medication management refer to [facility] inpatient for admission  Brielynn Sekula D 05/15/2013 11:07 AM

## 2013-05-15 NOTE — ED Notes (Signed)
TTS assessment with ACT team Ava in progress at present.

## 2013-05-15 NOTE — Progress Notes (Signed)
Pt accepted to [facility] pending 500 hall bed by Dr. Ladona Ridgel.   Catha Gosselin, [place] [phone]  ED CSW 05/15/2013 1038am

## 2013-05-15 NOTE — Progress Notes (Signed)
Psychoeducational Group Note  Date:  05/15/2013 Time:  2000  Group Topic/Focus:  Wrap-Up Group:   The focus of this group is to help patients review their daily goal of treatment and discuss progress on daily workbooks.  Participation Level: Did Not Attend  Participation Quality:  Not Applicable  Affect:  Not Applicable  Cognitive:  Not Applicable  Insight:  Not Applicable  Engagement in Group: Not Applicable  Additional Comments:  The patient did not attend group since he was asleep.   Hazle Coca S 05/15/2013, 9:20 PM

## 2013-05-15 NOTE — ED Notes (Signed)
Pt continues to pace in room, stating he feels like he is going to explode.  Dr Norlene Campbell notified, meds given.

## 2013-05-15 NOTE — Progress Notes (Signed)
Patient ID: Steven Barker, male   DOB: [birth year], 27 y.o.   MRN: [id number] Pt denies SI/HI/AVH.  Pt states that he was never suicidal.  He only said that he was to get mental health treatment.  Pt's father is Omair Dettmer.  Pt states that he needs help with depression, anxiety, and anger.  It was reported that pt told staff in ED that he was "going to go off."  I advised pt to let staff know prior to any outbursts.  Pt states that he has issues controlling his temper.  He states that he stays awake for days and then begins to hallucinate.  Pt reports having arthritis, which causes him to have issues with ambulation at times.  Pt states that his legs will give out of him at times.  Pt reports 2 falls in the past sick months.  Pt states that he has used a walker in the past.  He states that he uses a walking stick intermittently.  Pt states that he has legal charges pending for an open container charge.  Pt currently lives with his grandmother, father, and his older brother.  Pt has recently applied for disability related to his arthritis.

## 2013-05-16 DIAGNOSIS — F1210 Cannabis abuse, uncomplicated: Secondary | ICD-10-CM

## 2013-05-16 DIAGNOSIS — F1410 Cocaine abuse, uncomplicated: Secondary | ICD-10-CM

## 2013-05-16 MED ORDER — QUETIAPINE FUMARATE 100 MG PO TABS
100.0000 mg | ORAL_TABLET | ORAL | Status: AC
  Administered 2013-05-16: 100 mg via ORAL
  Filled 2013-05-16 (×2): qty 1

## 2013-05-16 MED ORDER — QUETIAPINE FUMARATE 200 MG PO TABS
200.0000 mg | ORAL_TABLET | Freq: Every day | ORAL | Status: DC
  Administered 2013-05-17: 200 mg via ORAL
  Filled 2013-05-16 (×3): qty 1

## 2013-05-16 MED ORDER — QUETIAPINE FUMARATE 25 MG PO TABS
25.0000 mg | ORAL_TABLET | Freq: Three times a day (TID) | ORAL | Status: DC | PRN
  Administered 2013-05-17 – 2013-05-20 (×8): 25 mg via ORAL
  Filled 2013-05-16 (×8): qty 1
  Filled 2013-05-16: qty 30

## 2013-05-16 MED ORDER — QUETIAPINE FUMARATE 100 MG PO TABS
100.0000 mg | ORAL_TABLET | Freq: Every day | ORAL | Status: AC
  Administered 2013-05-16: 100 mg via ORAL
  Filled 2013-05-16 (×2): qty 1
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
## 2013-05-16 NOTE — [facility] Group Notes (Signed)
[facility] Group Notes:  (Clinical Social Work)  04/18/2013   3:00-4:00PM  Summary of Progress/Problems:   The main focus of today's process group was for the patient to identify ways in which they have sabotaged their own mental health wellness/recovery.  There was a discussion initially about the concept of recovery, and what that means to different patients.  Motivational interviewing was used to explore the reasons they engage in behavior that is detrimental to recovery, and reasons for wanting to change.  Patients were asked to rate their motivation to change their self-sabotaging behaviors on a scale of 0 (no motivation) to 10 (willing to do whatever is recommended).   The patient expressed initially that he did not want to talk in group, then he proceeded to break into the group several times and interject some thoughts.  Later when called on, he once again declined sharing but later interjected once more.  He stated it is hard for him to sit through a group due to his pain, and CSW advised him to get up at back of room and pace as needed, which he did.  He stated his motivation to change is "5" because is not sure if he wants to move toward recovery or back to the status of where he came from because he simply does have to physically go back there.  Type of Therapy:  Process Group  Participation Level:  Active  Participation Quality:  Attentive, Resistant and Sharing  Affect:  Anxious, Depressed and Flat  Cognitive:  Oriented  Insight:  Developing/Improving  Engagement in Therapy:  Improving  Modes of Intervention:  Education, Motivational Interviewing   Ambrose Mantle, LCSW 4:54 PM
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
## 2013-05-16 NOTE — Progress Notes (Signed)
Pt laying in bed resting with eyes closed. Respirations even and unlabored. No distress noted.  

## 2013-05-16 NOTE — Progress Notes (Signed)
D: Pt denies SI/HI/AV. Pt is pleasant and cooperative. Pt states he is very anxious today and thinks his medication may need to be changed.Pt rates depression at a 3, anxiety at a 8, and Helplessness/hopelessness at a 2.  A: Pt was offered support and encouragement. Pt was given scheduled medications. Pt was encourage to attend groups. Q 15 minute checks were done for safety.  R:Pt attends groups and interacts well with peers and staff. Pt taking medication. Pt has no complaints.Pt receptive to treatment and safety maintained on unit.

## 2013-05-17 DIAGNOSIS — F1910 Other psychoactive substance abuse, uncomplicated: Secondary | ICD-10-CM | POA: Diagnosis present

## 2013-05-17 DIAGNOSIS — M069 Rheumatoid arthritis, unspecified: Secondary | ICD-10-CM | POA: Diagnosis present

## 2013-05-17 MED ORDER — ENSURE COMPLETE PO LIQD
237.0000 mL | Freq: Two times a day (BID) | ORAL | Status: DC
  Administered 2013-05-18 – 2013-05-20 (×5): 237 mL via ORAL

## 2013-05-17 NOTE — Progress Notes (Signed)
[facility] MD Progress Note  05/17/2013 3:06 PM Steven Barker  MRN:  [id number]  Subjective:  Steven Barker has no complaints today and has been compliant with his medication. Patient has been compliant with his medication without and with scissors. Patient reported he has been less anxious today. Patient reported he has made up his mind he should not continue using his drugs of choice which is not good for his health. Patient reported he is going to quit once discharged from the hospital and follow up with outpatient care. He reported that his family is supportive to him. He treated his anxiety is 8/10 and depression is 2/10. Patient contracts for safety without suicidal ideation today.  Diagnosis:   DSM5: Schizophrenia Disorders:   Obsessive-Compulsive Disorders:   Trauma-Stressor Disorders:   Substance/Addictive Disorders:   Depressive Disorders:    Axis I: Substance Induced Mood Disorder and Polysubstance abuse  ADL's:  Impaired  Sleep: Fair  Appetite:  Fair  Suicidal Ideation:  Patient continue to suffer with the suicidal thoughts but contracts for safety today Homicidal Ideation:  Denied AEB (as evidenced by):  Psychiatric Specialty Exam: ROS  Blood pressure 130/88, pulse 69, temperature 97.6 F (36.4 C), temperature source Oral, resp. rate 24, height 5' 5.75" (1.67 m), weight 56.246 kg (124 lb).Body mass index is 20.17 kg/(m^2).  General Appearance: Bizarre, Disheveled and Guarded  Eye Contact::  Minimal  Speech:  Clear and Coherent  Volume:  Decreased  Mood:  Angry and Anxious  Affect:  Depressed and Flat  Thought Process:  Coherent and Goal Directed  Orientation:  Full (Time, Place, and Person)  Thought Content:  Obsessions and Rumination  Suicidal Thoughts:  Yes.  without intent/plan  Homicidal Thoughts:  No  Memory:  Immediate;   Fair  Judgement:  Impaired  Insight:  Lacking  Psychomotor Activity:  Psychomotor Retardation  Concentration:  Fair  Recall:  Fair  Akathisia:   NA  Handed:  Right  AIMS (if indicated):     Assets:  Communication Skills Desire for Improvement Intimacy Physical Health Resilience Social Support Transportation  Sleep:  Number of Hours: 6.5   Current Medications: Current Facility-Administered Medications  Medication Dose Route Frequency Provider Last Rate Last Dose  . acetaminophen (TYLENOL) tablet 650 mg  650 mg Oral Q6H PRN Benjaman Pott, MD   650 mg at 05/16/13 1623  . alum & mag hydroxide-simeth (MAALOX/MYLANTA) 200-200-20 MG/5ML suspension 30 mL  30 mL Oral PRN Benjaman Pott, MD      . clonazePAM Scarlette Calico) tablet 0.5 mg  0.5 mg Oral BID Verne Spurr, PA-C   0.5 mg at 05/17/13 0800  . hydrOXYzine (ATARAX/VISTARIL) tablet 50 mg  50 mg Oral Q6H PRN Benjaman Pott, MD   50 mg at 05/16/13 1016  . ibuprofen (ADVIL,MOTRIN) tablet 600 mg  600 mg Oral Q8H PRN Benjaman Pott, MD   600 mg at 05/17/13 6213  . magnesium hydroxide (MILK OF MAGNESIA) suspension 30 mL  30 mL Oral Daily PRN Benjaman Pott, MD      . meloxicam [facility]) tablet 7.5 mg  7.5 mg Oral Daily Verne Spurr, PA-C   7.5 mg at 05/17/13 0800  . nicotine (NICODERM CQ - dosed in mg/24 hours) patch 21 mg  21 mg Transdermal Daily Benjaman Pott, MD   21 mg at 05/17/13 0622  . QUEtiapine (SEROQUEL) tablet 200 mg  200 mg Oral QHS Verne Spurr, PA-C      . QUEtiapine (SEROQUEL) tablet 25 mg  25 mg Oral TID PRN Verne Spurr, PA-C   25 mg at 05/17/13 1100    Lab Results: No results found for this or any previous visit (from the past 48 hour(s)).  Physical Findings: AIMS: Facial and Oral Movements Muscles of Facial Expression: None, normal Lips and Perioral Area: None, normal Jaw: None, normal Tongue: None, normal,Extremity Movements Upper (arms, wrists, hands, fingers): None, normal Lower (legs, knees, ankles, toes): None, normal, Trunk Movements Neck, shoulders, hips: None, normal, Overall Severity Severity of abnormal movements (highest score from questions  above): None, normal Incapacitation due to abnormal movements: None, normal Patient's awareness of abnormal movements (rate only patient's report): No Awareness, Dental Status Current problems with teeth and/or dentures?: No Does patient usually wear dentures?: No  CIWA:    COWS:     Treatment Plan Summary: Daily contact with patient to assess and evaluate symptoms and progress in treatment Medication management  Plan: Continue Seroquel and clonazepam Monitor for that specific other medication Treatment Plan/Recommendations:   1. Admit for crisis management and stabilization. 2. Medication management to reduce current symptoms to base line and improve the patient's overall level of functioning. 3. Treat health problems as indicated. 4. Develop treatment plan to decrease risk of relapse upon discharge and to reduce the need for readmission. 5. Psycho-social education regarding relapse prevention and self care. 6. Health care follow up as needed for medical problems. 7. Restart home medications where appropriate. 8. Disposition plans and progress, May discharge he continued contract for safety on Tuesday   Medical Decision Making Problem Points:  Established problem, worsening (2), Review of last therapy session (1) and Review of psycho-social stressors (1) Data Points:  Review or order clinical lab tests (1) Review or order medicine tests (1) Review of medication regiment & side effects (2) Review of new medications or change in dosage (2)  I certify that inpatient services furnished can reasonably be expected to improve the patient's condition.   Kaysen Sefcik,JANARDHAHA R. 05/17/2013, 3:06 PM

## 2013-05-17 NOTE — Progress Notes (Signed)
D: Pt denies SI/HI/AV. Pt is pleasant and cooperative. Pt rates anxiety at a 8, and Helplessness/hopelessness at a 8.  A: Pt was offered support and encouragement. Pt was given scheduled medications. Pt was encourage to attend groups. Q 15 minute checks were done for safety.  R:Pt attends groups and interacts well with peers and staff. Pt taking medication. Pt has no complaints.Pt receptive to treatment and safety maintained on unit.

## 2013-05-17 NOTE — [facility] Group Notes (Signed)
[facility] Group Notes:  (Clinical Social Work)  05/17/2013   3:00-4:00PM  Summary of Progress/Problems:   The main focus of today's process group was to   identify the patient's current support system and decide on other supports that can be put in place.  The picture on workbook was used to discuss why additional supports are needed, and a hand-out was distributed with four definitions/levels of support, then used to talk about how patients have given and received all different kinds of support.  An emphasis was placed on using counselor, doctor, therapy groups, 12-step groups, and problem-specific support groups to expand supports.  The patient identified one additional support as being return to school or participating in fun activities.  He stated he did not know that all of the things we discussed during this group were available.  Type of Therapy:  Process Group  Participation Level:  Active  Participation Quality:  Attentive and Sharing  Affect:  Blunted  Cognitive:  Oriented  Insight:  Developing/Improving  Engagement in Therapy:  Developing/Improving  Modes of Intervention:  Education,  Support and Processing  Ambrose Mantle, LCSW 05/17/2013, 2:57 PM

## 2013-05-17 NOTE — Progress Notes (Signed)
NUTRITION ASSESSMENT  Pt identified as at risk on the Malnutrition Screen Tool  INTERVENTION: 1. Educated patient on the importance of nutrition and encouraged intake of food and beverages. 2. Discussed weight goals. 3. Supplements: Ensure Complete po BID, each supplement provides 350 kcal and 13 grams of protein   Assessment:  Patient admitted with depression with SI.  Denied drugs but cocaine and THC positive on drug screen.  Hx includes anxiety, depression and rheumatoid arthritis.   Patient reports poor appetite and intake prior to admit secondary to depression, drugs and "money".  UBW 120 lbs per patient with some weight gain.  Wants Ensure.  27 y.o. male  Height: Ht Readings from Last 1 Encounters:  05/15/13 5' 5.75" (1.67 m)    Weight: Wt Readings from Last 1 Encounters:  05/15/13 124 lb (56.246 kg)    Weight Hx: Wt Readings from Last 10 Encounters:  05/15/13 124 lb (56.246 kg)    BMI:  Body mass index is 20.17 kg/(m^2). Pt meets criteria for wnl based on current BMI.  Estimated Nutritional Needs: Kcal: 25-30 kcal/kg Protein: > 1 gram protein/kg Fluid: 1 ml/kcal  Diet Order: General Pt is also offered choice of unit snacks mid-morning and mid-afternoon.  Pt is eating as desired.   Lab results and medications reviewed.   Oran Rein, RD, LDN Clinical Inpatient Dietitian Pager:  [phone] Weekend and after hours pager:  [phone]

## 2013-05-17 NOTE — Progress Notes (Signed)
Writer entered patients room and observed him lying in bed asleep. Patient was aroused when his name was called. Writer introduced self and informed him of medications scheduled. Patient reported that he would meet me at the medication window. Patient took his meds and Clinical research associate gave him gatorade d/t patient sweating. Patient voiced no complaints, encouraged him to get snack. Patient did not attend group this evening. Support offered, safety maintained on unit, will continue to monitor.

## 2013-05-17 NOTE — Progress Notes (Signed)
Adult Psychoeducational Group Note  Date:  05/17/2013 Time:  8:00pm Group Topic/Focus:  Wrap-Up Group:   The focus of this group is to help patients review their daily goal of treatment and discuss progress on daily workbooks.  Participation Level:  Active  Participation Quality:  Appropriate and Attentive  Affect:  Appropriate  Cognitive:  Alert and Appropriate  Insight: Appropriate  Engagement in Group:  Engaged  Modes of Intervention:  Discussion and Education  Additional Comments:  Pt attended and participated in group. When ask how was his day pt stated fair and stressful. When ask why his day was so stressful pt stated because his grand mother is in the hospital in ICU. When ask what was his goal for tomorrow pt stated to eat more.  Shelly Bombard D 05/17/2013, 9:06 PM

## 2013-05-17 NOTE — Progress Notes (Signed)
Adult Psychoeducational Group Note  Date:  05/17/2013 Time:  0900  Group Topic/Focus:  Spirituality:   The focus of this group is to discuss how one's spirituality can aide in recovery.  Participation Level:  Minimal  Participation Quality:  Appropriate and Attentive  Affect:  Anxious and Irritable  Cognitive:  Alert and Appropriate  Insight: Lacking  Engagement in Group:  Engaged  Modes of Intervention:  Discussion, Education and Exploration  Additional Comments:  Pt is attentive but restless and irritable during group. He listened but did not share.  [name] [name] 05/17/2013, 12:10 PM

## 2013-05-18 DIAGNOSIS — F333 Major depressive disorder, recurrent, severe with psychotic symptoms: Secondary | ICD-10-CM

## 2013-05-18 MED ORDER — QUETIAPINE FUMARATE 300 MG PO TABS
300.0000 mg | ORAL_TABLET | Freq: Every day | ORAL | Status: DC
  Administered 2013-05-18 – 2013-05-19 (×2): 300 mg via ORAL
  Filled 2013-05-18 (×4): qty 1

## 2013-05-18 NOTE — Progress Notes (Signed)
D:Pt rates his depression as an 8 on 1-10 scale with 10 being the most depressed. He c/o stress with anxiety. Pt also c/o pain in his knees. Pt has a flat/anxious affect. A:Supported pt to discuss feelings. Gave prn medication as ordered and requested. Offered encouragement and 15 minute checks. R:Pt denies si and hi. Safety maintained on the unit.

## 2013-05-18 NOTE — Progress Notes (Signed)
Recreation Therapy Notes  Date: 09.22.2014 Time: 3:00pm Location: [address] Dayroom   Group Topic: Wellness  Goal Area(s) Addresses:  Patient will define components of whole wellness. Patient will verbalize benefit of whole wellness.  Behavioral Response: Attentive, Appropriate  Intervention: Informational Worksheet  Activity: 6 Dimensions of Health. Patients were asked to identify at least 3 ways they are personally addressing the 6 dimensions of health: Physical, Emotional, Spiritual, Social, Environmental and Intellectual.   Education: Discharge Planning, Coping Skills  Education Outcome: Acknowledges understanding  Clinical Observations/Feedback: Patient made no contributions to opening discussion, however he appeared to actively listen, as he maintained appropriate eye contact with speaker. Patient actively participated in activity, completing worksheet as instructed. At approximately 3:10pm patient was asked to leave group session by PA. Patient did not return to group session.   Marykay Lex Jannely Henthorn, LRT/CTRS  Jearl Klinefelter 05/18/2013 4:33 PM

## 2013-05-18 NOTE — Progress Notes (Signed)
Adult Psychoeducational Group Note  Date:  05/18/2013 Time:  11:00 PM  Group Topic/Focus:  Goals Group:   The focus of this group is to help patients establish daily goals to achieve during treatment and discuss how the patient can incorporate goal setting into their daily lives to aide in recovery.  Participation Level:  Active  Participation Quality:  Appropriate  Affect:  Appropriate  Cognitive:  Appropriate  Insight: Appropriate  Engagement in Group:  Engaged  Modes of Intervention:  Discussion  Additional Comments: Pt stated that he enjoyed the social interaction with the others and love to make everyone laugh.  Terie Purser R 05/18/2013, 11:00 PM

## 2013-05-18 NOTE — Progress Notes (Signed)
Chaplain followed up with pt re: relationship with family and hospitalization of pt's grandmother in [place].   Grandmother moved to room 1430.  Reported this to pt.  Pt will call grandmother after rec therapy group.  Pt reported that he is not contacting his family, as they are frustrating him and he feels they are making matters worse.  Reported that he plans to discharge from [facility] and move out of his current living situation.  Was not specific about is frustrating him about relating with family.    Will continue to follow for support during admission.    Belva Crome MDiv

## 2013-05-18 NOTE — B[facility] Group Notes (Signed)
[facility] LCSW Aftercare Discharge Planning Group Note   05/18/2013  11:00 AM   Participation Quality:  Appropriate  Mood/Affect:  Appropriate, Depressed  Depression Rating:  7  Anxiety Rating:  10  Thoughts of Suicide:  No  Will you contract for safety?   NA  Current AVH:  NA  Plan for Discharge/Comments:  Patient attending discharge planning group and actively participated in group.  He advised of having home but no outpatient services.  CSW provided all participants with daily workbook and information on services offered by [facility].   Transportation Means: Patient uses public transportation.   Supports:  Patient has limited support system.   Winslow Ederer, Joesph July

## 2013-05-18 NOTE — Progress Notes (Signed)
D: Patient denies SI/HI/AVH. Patient rates hopelessness as 9,  depression as 8, and anxiety as 10.  Patient affect is anxious. Mood is anxious.  Pt states, "I have a lot going on.  My grandmother is in the ICU.  I have a lot of stress and anxiety.  My father is in the hospital.  My whole family is in the hospital."  Patient did attend evening group. Patient visible on the milieu. No distress noted. A: Support and encouragement offered. Scheduled medications given to pt. Q 15 min checks continued for patient safety. R: Patient receptive. Patient remains safe on the unit.

## 2013-05-18 NOTE — B[facility] Group Notes (Signed)
[facility] LCSW Group Therapy          Overcoming Obstacles       1:15 -2:30        05/18/2013   3:07 PM     Type of Therapy:  Group Therapy  Participation Level:  Appropriate  Participation Quality:  Appropriate  Affect:  Appropriate, Alert  Cognitive:  Attentive Appropriate  Insight: Developing/Improving Engaged  Engagement in Therapy: Developing/Imprvoing Engaged  Modes of Intervention:  Discussion Exploration  Education Rapport BuildingProblem-Solving Support  Summary of Progress/Problems:  The main focus of today's group was overcoming  Obstacles.  He listened attentively and nodded in agreement with peers but did not engage in discussion.  [name] 05/18/2013    3:07 PM

## 2013-05-18 NOTE — Progress Notes (Signed)
Pt attended grief and loss group facilitated by chaplain Burnis Kingfisher.   Group members discussed themes in grief process, identifying loss both in relationships and loss in relation to self (isolation, guilt, fear, anger, sadness).  Discussed how their family history and social expectations form how they feel they have carried grief historically and how they feel they "should" act in grief.  Connected education with experiences of loss and shared with one another about present losses in life.  Group provided empathic support and normalized variety of grief process among members.  Group closed by reflecting on what is helpful for members in journeying through grief.   Steven Barker shared grief around death of mother.  Described process of caring for mother with brain cancer.  Expressed extended family who were not present during mother's illness and emerged after her passing.  Steven Barker is now isolated from this side of his extended family.  Described family history of "not talking about it" and "isolating" as ways of dealing with grief.  Spoke with group about substance use as way of coping with pain of loss, recognizing that when he attempts to cease using substances, the pain of his loss becomes overwhelming.

## 2013-05-18 NOTE — Progress Notes (Signed)
Adult Psychoeducational Group Note  Date:  05/18/2013 Time:  11:48 AM  Group Topic/Focus:  Developing a Wellness Toolbox:   The focus of this group is to help patients develop a "wellness toolbox" with skills and strategies to promote recovery upon discharge.  Participation Level:  Active  Participation Quality:  Attentive  Affect:  Appropriate  Cognitive:  Appropriate  Insight: Good  Engagement in Group:  Engaged  Modes of Intervention:  Discussion  Additional Comments:    Edmonia Caprio 05/18/2013, 11:48 AM

## 2013-05-18 NOTE — Progress Notes (Signed)
Patient ID: Steven Barker, male   DOB: [birth year], 27 y.o.   MRN: [id number] [facility] MD Progress Note  05/18/2013 4:05 PM Steven Barker  MRN:  [id number]  Subjective:  Steven Barker states he is doing "ok" today, his anxiety was still up this morning and he has decided to "just stay away from his family," he notes that they are a big source of stress for him. He also reports being calmer now in the afternoon. The groups he says are helping him. He is denying any further withdrawal symptoms from the K2. Diagnosis:   DSM5: Schizophrenia Disorders:   Obsessive-Compulsive Disorders:   Trauma-Stressor Disorders:   Substance/Addictive Disorders:   Depressive Disorders:    Axis I: Substance Induced Mood Disorder and Polysubstance abuse  ADL's:  Impaired  Sleep: Fair  Appetite:  Fair  Suicidal Ideation:  Patient continue to suffer with the suicidal thoughts but contracts for safety today Homicidal Ideation:  Denied AEB (as evidenced by):  Psychiatric Specialty Exam: ROS  Blood pressure 123/78, pulse 76, temperature 98.1 F (36.7 C), temperature source Oral, resp. rate 19, height 5' 5.75" (1.67 m), weight 56.246 kg (124 lb).Body mass index is 20.17 kg/(m^2).  General Appearance: Bizarre, Disheveled and Guarded  Eye Contact::  Minimal  Speech:  Clear and Coherent  Volume:  Decreased  Mood:  Angry and Anxious  Affect:  Depressed and Flat  Thought Process:  Coherent and Goal Directed  Orientation:  Full (Time, Place, and Person)  Thought Content:  Obsessions and Rumination  Suicidal Thoughts:  Yes.  without intent/plan  Homicidal Thoughts:  No  Memory:  Immediate;   Fair  Judgement:  Impaired  Insight:  Lacking  Psychomotor Activity:  Psychomotor Retardation  Concentration:  Fair  Recall:  Fair  Akathisia:  NA  Handed:  Right  AIMS (if indicated):     Assets:  Communication Skills Desire for Improvement Intimacy Physical Health Resilience Social Support Transportation  Sleep:   Number of Hours: 6.75   Current Medications: Current Facility-Administered Medications  Medication Dose Route Frequency Provider Last Rate Last Dose  . acetaminophen (TYLENOL) tablet 650 mg  650 mg Oral Q6H PRN Benjaman Pott, MD   650 mg at 05/18/13 1312  . alum & mag hydroxide-simeth (MAALOX/MYLANTA) 200-200-20 MG/5ML suspension 30 mL  30 mL Oral PRN Benjaman Pott, MD      . clonazePAM Scar[facility] Calico) tablet 0.5 mg  0.5 mg Oral BID Verne Spurr, PA-C   0.5 mg at 05/18/13 0720  . feeding supplement (ENSURE COMPLETE) liquid 237 mL  237 mL Oral BID BM Jeoffrey Massed, RD   237 mL at 05/18/13 1313  . hydrOXYzine (ATARAX/VISTARIL) tablet 50 mg  50 mg Oral Q6H PRN Benjaman Pott, MD   50 mg at 05/18/13 1155  . ibuprofen (ADVIL,MOTRIN) tablet 600 mg  600 mg Oral Q8H PRN Benjaman Pott, MD   600 mg at 05/18/13 0807  . magnesium hydroxide (MILK OF MAGNESIA) suspension 30 mL  30 mL Oral Daily PRN Benjaman Pott, MD      . meloxicam [facility]) tablet 7.5 mg  7.5 mg Oral Daily Verne Spurr, PA-C   7.5 mg at 05/18/13 0720  . nicotine (NICODERM CQ - dosed in mg/24 hours) patch 21 mg  21 mg Transdermal Daily Benjaman Pott, MD   21 mg at 05/18/13 0802  . QUEtiapine (SEROQUEL) tablet 200 mg  200 mg Oral QHS Verne Spurr, PA-C   200 mg at 05/17/13 2113  .  QUEtiapine (SEROQUEL) tablet 25 mg  25 mg Oral TID PRN Verne Spurr, PA-C   25 mg at 05/18/13 1313    Lab Results: No results found for this or any previous visit (from the past 48 hour(s)).  Physical Findings: AIMS: Facial and Oral Movements Muscles of Facial Expression: None, normal Lips and Perioral Area: None, normal Jaw: None, normal Tongue: None, normal,Extremity Movements Upper (arms, wrists, hands, fingers): None, normal Lower (legs, knees, ankles, toes): None, normal, Trunk Movements Neck, shoulders, hips: None, normal, Overall Severity Severity of abnormal movements (highest score from questions above): None, normal Incapacitation due to  abnormal movements: None, normal Patient's awareness of abnormal movements (rate only patient's report): No Awareness, Dental Status Current problems with teeth and/or dentures?: No Does patient usually wear dentures?: No  CIWA:    COWS:     Treatment Plan Summary: Daily contact with patient to assess and evaluate symptoms and progress in treatment Medication management  Plan: Continue Seroquel and clonazepam Monitor for that specific other medication Treatment Plan/Recommendations:  1. Will increase the Seroquel at patient's request.  2. Could d/c home tomorrow if does well. 3. Will continue other medications as directed. Medical Decision Making Problem Points:  Established problem, worsening (2), Review of last therapy session (1) and Review of psycho-social stressors (1) Data Points:  Review or order clinical lab tests (1) Review or order medicine tests (1) Review of medication regiment & side effects (2) Review of new medications or change in dosage (2)  I certify that inpatient services furnished can reasonably be expected to improve the patient's condition.  Rona Ravens. Colman Birdwell RPAC 4:11 PM 05/18/2013

## 2013-05-18 NOTE — Progress Notes (Signed)
Patient ID: Steven Barker, male   DOB: 12/16/1985, 27 y.o.   MRN: [id number] D: pt. Visible on the unit, in dayroom laughing and talking with other clients. Pt. Reports day "better than yesterday", but did not elaborate. A:Writer introduced self to client and encouraged group. Pt. Will be monitored q72min for safety. R: Pt. Is safe on the unit and attended group.

## 2013-05-19 NOTE — Progress Notes (Deleted)
[facility] Adult Case Management Discharge Plan :  Will you be returning to the same living situation after discharge: Yes,  Patient is returning to his home. At discharge, do you have transportation home?:Yes,  Patient assisted with bus pass. Do you have the ability to pay for your medications:No.  Patient needs assistance with indigent medications   Release of information consent forms completed and in the chart;  Patient's signature needed at discharge.  Patient to Follow up at: Follow-up Information   Follow up with [facility] On 05/20/2013. (Please to to the walk in clinic at [facility] on Wednesday, May 20, 2013 or any weekday between 8Am - 12:00 Noon)    Contact information:   [address] [place], [place]   [address]  [phone]      Patient denies SI/HI:   Patient no longer endorsing SI/HI or other thoughts of self harm.   Safety Planning and Suicide Prevention discussed:  .Reviewed with all patients during discharge planning group   Machelle Raybon, Joesph July 05/19/2013, 10:09 AM

## 2013-05-19 NOTE — Progress Notes (Signed)
Patient stated he will think about returning to school, only has 6th grade education.  His brother brings bad people around him, and neighbors are aware of their problems.  Encouraged patient to talk to counselor/MD about goals before discharge and to follow through with plans to return to school after [facility] discharge.

## 2013-05-19 NOTE — BHH Group Notes (Signed)
[facility] LCSW Group Therapy  Feelings Around Diagnosis 1:15 - 2:30             05/19/2013   3:01 PM     Type of Therapy:  Group Therapy  Participation Level:  Appropriate  Participation Quality:  Appropriate  Affect:  Appropriate  Cognitive:  Attentive Appropriate  Insight:  Engaged  Engagement in Therapy:  Engaged  Modes of Intervention:  Discussion Exploration Problem-Solving Supportive  Summary of Progress/Problems:  Patient shared he does not have any specific feelings about his diagnosis.   He stated he is feeling much better and that is all that matters to him,.  [name] 05/19/2013  3:01 PM

## 2013-05-19 NOTE — Progress Notes (Signed)
Recreation Therapy Notes  Date: 09.23.2014 Time: 2:45pm Location: [address] Dayroom  Group Topic: Animal Assisted Activities (AAA)  Behavioral Response: Appropriate  Affect: Euthymic  Clinical Observations/Feedback: Dog Team: [employer]. Patient interacted appropriately with peer, dog team, LRT and MHT.   Marykay Lex Trystyn Sitts, LRT/CTRS  Jearl Klinefelter 05/19/2013 8:58 PM

## 2013-05-19 NOTE — B[facility] Suicide Risk Assessment (Signed)
[facility] INPATIENT:  Family/Significant Other Suicide Prevention Education  Suicide Prevention Education:  Education Complete; Steven Barker, Steven Barker Father, [phone];) has been identified by the patient as the family member/significant other with whom the patient will be residing, and identified as the person(s) who will aid the patient in the event of a mental health crisis (suicidal ideations/suicide attempt).  With written consent from the patient, the family member/significant other has been provided the following suicide prevention education, prior to the and/or following the discharge of the patient.  Father advised he is very familiar with SPE.  The suicide prevention education provided includes the following:  Suicide risk factors  Suicide prevention and interventions  National Suicide Hotline telephone number  [facility] Advocate assessment telephone number  [facility] Emergency Assistance 911  [facility] and/or Residential Mobile Crisis Unit telephone number  Request made of family/significant other to:  Remove weapons (e.g., guns, rifles, knives), all items previously/currently identified as safety concern.  Father advised patient does not have access to guns.  Remove drugs/medications (over-the-counter, prescriptions, illicit drugs), all items previously/currently identified as a safety concern.  The family member/significant other verbalizes understanding of the suicide prevention education information provided.  The family member/significant other agrees to remove the items of safety concern listed above.  Steven Barker 05/19/2013, 11:25 AM

## 2013-05-19 NOTE — Progress Notes (Signed)
Adult Psychoeducational Group Note  Date:  05/19/2013 Time:  10:47 PM  Group Topic/Focus:  Goals Group:   The focus of this group is to help patients establish daily goals to achieve during treatment and discuss how the patient can incorporate goal setting into their daily lives to aide in recovery.  Participation Level:  Active  Participation Quality:  Appropriate  Affect:  Appropriate  Cognitive:  Appropriate  Insight: Appropriate  Engagement in Group:  Engaged  Modes of Intervention:  Discussion  Additional Comments:  Pt was excited about leaving tomorrow and looks forward to using the coping skills he had learn while here.  Aldona Lento 05/19/2013, 10:47 PM

## 2013-05-19 NOTE — Progress Notes (Signed)
The focus of this group is to educate the patient on the purpose and policies of crisis stabilization and provide a format to answer questions about their admission.  The group details unit policies and expectations of patients while admitted.  Patient attended 0900 nurse education orientation group.  Patient actively listened, appropriate affect, alert, appropriate insight and engagement.  Patient will work on goals for discharge today.

## 2013-05-19 NOTE — Progress Notes (Signed)
[facility] MD Progress Note  05/19/2013 1:56 PM Steven Barker  MRN:  [id number]  Subjective:  Steven Barker states he is doing whole lot better and stated that he is enjoying socialization. Patient has been seeking drug of abuse from family member and also made suicidal threat. He has rated his anxiety 4/10 and depression as 3/10. Reportedly he has decided to "just stay away from his family," because they are a big source of stress for him. The groups he says are helping him. He is denying any further withdrawal symptoms from the drug of abuse. Patient stated that he needs more anxiety medication and disagree with non controlled substance for the same benefit. We will watch for his drug seeking behaviors.   Diagnosis:   DSM5: Schizophrenia Disorders:   Obsessive-Compulsive Disorders:   Trauma-Stressor Disorders:   Substance/Addictive Disorders:   Depressive Disorders:    Axis I: Substance Induced Mood Disorder and Polysubstance abuse  ADL's:  Impaired  Sleep: Fair  Appetite:  Fair  Suicidal Ideation:  Patient continue to suffer with the suicidal thoughts but contracts for safety today Homicidal Ideation:  Denied AEB (as evidenced by):  Psychiatric Specialty Exam: ROS  Blood pressure 123/80, pulse 76, temperature 97.9 F (36.6 C), temperature source Oral, resp. rate 16, height 5' 5.75" (1.67 m), weight 56.246 kg (124 lb).Body mass index is 20.17 kg/(m^2).  General Appearance: Bizarre, Disheveled and Guarded  Eye Contact::  Minimal  Speech:  Clear and Coherent  Volume:  Decreased  Mood:  Angry and Anxious  Affect:  Depressed and Flat  Thought Process:  Coherent and Goal Directed  Orientation:  Full (Time, Place, and Person)  Thought Content:  Obsessions and Rumination  Suicidal Thoughts:  Yes.  without intent/plan  Homicidal Thoughts:  No  Memory:  Immediate;   Fair  Judgement:  Impaired  Insight:  Lacking  Psychomotor Activity:  Psychomotor Retardation  Concentration:  Fair  Recall:   Fair  Akathisia:  NA  Handed:  Right  AIMS (if indicated):     Assets:  Communication Skills Desire for Improvement Intimacy Physical Health Resilience Social Support Transportation  Sleep:  Number of Hours: 67.5   Current Medications: Current Facility-Administered Medications  Medication Dose Route Frequency Provider Last Rate Last Dose  . acetaminophen (TYLENOL) tablet 650 mg  650 mg Oral Q6H PRN Benjaman Pott, MD   650 mg at 05/18/13 1312  . alum & mag hydroxide-simeth (MAALOX/MYLANTA) 200-200-20 MG/5ML suspension 30 mL  30 mL Oral PRN Benjaman Pott, MD      . clonazePAM Scarlette Calico) tablet 0.5 mg  0.5 mg Oral BID Verne Spurr, PA-C   0.5 mg at 05/19/13 0810  . feeding supplement (ENSURE COMPLETE) liquid 237 mL  237 mL Oral BID BM Jeoffrey Massed, RD   237 mL at 05/19/13 1326  . hydrOXYzine (ATARAX/VISTARIL) tablet 50 mg  50 mg Oral Q6H PRN Benjaman Pott, MD   50 mg at 05/18/13 1155  . ibuprofen (ADVIL,MOTRIN) tablet 600 mg  600 mg Oral Q8H PRN Benjaman Pott, MD   600 mg at 05/18/13 0807  . magnesium hydroxide (MILK OF MAGNESIA) suspension 30 mL  30 mL Oral Daily PRN Benjaman Pott, MD      . meloxicam [facility]) tablet 7.5 mg  7.5 mg Oral Daily Verne Spurr, PA-C   7.5 mg at 05/19/13 0810  . nicotine (NICODERM CQ - dosed in mg/24 hours) patch 21 mg  21 mg Transdermal Daily Benjaman Pott, MD  21 mg at 05/19/13 0811  . QUEtiapine (SEROQUEL) tablet 25 mg  25 mg Oral TID PRN Verne Spurr, PA-C   25 mg at 05/19/13 1336  . QUEtiapine (SEROQUEL) tablet 300 mg  300 mg Oral QHS Verne Spurr, PA-C   300 mg at 05/18/13 2132    Lab Results: No results found for this or any previous visit (from the past 48 hour(s)).  Physical Findings: AIMS: Facial and Oral Movements Muscles of Facial Expression: None, normal Lips and Perioral Area: None, normal Jaw: None, normal Tongue: None, normal,Extremity Movements Upper (arms, wrists, hands, fingers): None, normal Lower (legs, knees,  ankles, toes): None, normal, Trunk Movements Neck, shoulders, hips: None, normal, Overall Severity Severity of abnormal movements (highest score from questions above): None, normal Incapacitation due to abnormal movements: None, normal Patient's awareness of abnormal movements (rate only patient's report): No Awareness, Dental Status Current problems with teeth and/or dentures?: No Does patient usually wear dentures?: No  CIWA:    COWS:     Treatment Plan Summary: Daily contact with patient to assess and evaluate symptoms and progress in treatment Medication management  Treatment Plan/Recommendations:  1. Continue Seroquel and Klonopin and monitor for adverse effects 2. Monitor for drug seeking behaviors.  3. Disposition plans are in progress and discharge 1-2 days if he continue to contract for safety 4. Will continue other medications as directed.  Medical Decision Making Problem Points:  Established problem, worsening (2), Review of last therapy session (1) and Review of psycho-social stressors (1) Data Points:  Review or order clinical lab tests (1) Review or order medicine tests (1) Review of medication regiment & side effects (2) Review of new medications or change in dosage (2)  I certify that inpatient services furnished can reasonably be expected to improve the patient's condition.   Kanitra Purifoy,JANARDHAHA R. 05/19/2013 2:02 PM

## 2013-05-19 NOTE — Progress Notes (Signed)
Adult Psychoeducational Group Note  Date:  05/19/2013 Time:  11:00am Group Topic/Focus:  Recovery Goals:   The focus of this group is to identify appropriate goals for recovery and establish a plan to achieve them.  Participation Level:  Active  Participation Quality:  Appropriate and Attentive  Affect:  Appropriate  Cognitive:  Alert and Appropriate  Insight: Appropriate  Engagement in Group:  Engaged  Modes of Intervention:  Discussion and Education  Additional Comments:  Pt attended and participated in group. When ask what recovery means to him pt stated being stable having a clear mind and not flipping out or being anxious. When ask what he could do to help himself with his recovery pt stated counseling would be beneficial.  Shelly Bombard D 05/19/2013, 1:19 PM

## 2013-05-19 NOTE — Progress Notes (Signed)
Patient ID: Steven Barker, male   DOB: [birth year], 27 y.o.   MRN: [id number]  D: Pt denies SI/HI/AVH. Pt is pleasant and cooperative. Pt states this is the best he felt in years.  A: Pt was offered support and encouragement. Pt was given scheduled medications. Pt was encourage to attend groups. Q 15 minute checks were done for safety.   R:Pt  interacts well with peers and staff. Pt is taking medication. Pt has no complaints at this time.Pt receptive to treatment and safety maintained on unit.

## 2013-05-19 NOTE — Progress Notes (Signed)
Patient ID: Steven Barker, male   DOB: Aug 08, 1986, 27 y.o.   MRN: [id number] He has been up and to groups interacting with peers and staff.  Requested and received a prn of seroquel for anxiety that helped . Self inventory: depression 2, hopelessness 0 denies w/d symptoms, has joint and hand pain but has nor requested pain medication. He spoke of being anxious because he would be returning home to the same stressors.

## 2013-05-20 DIAGNOSIS — F1910 Other psychoactive substance abuse, uncomplicated: Principal | ICD-10-CM

## 2013-05-20 MED ORDER — QUETIAPINE FUMARATE 25 MG PO TABS: 25.0000 mg | ORAL_TABLET | Freq: Three times a day (TID) | ORAL | Status: DC | PRN

## 2013-05-20 MED ORDER — CLONAZEPAM 0.5 MG PO TABS: 0.5000 mg | ORAL_TABLET | Freq: Two times a day (BID) | ORAL | Status: DC

## 2013-05-20 MED ORDER — MELOXICAM 7.5 MG PO TABS: 7.5000 mg | ORAL_TABLET | Freq: Every day | ORAL | Status: DC

## 2013-05-20 MED ORDER — QUETIAPINE FUMARATE 300 MG PO TABS: 300.0000 mg | ORAL_TABLET | Freq: Every day | ORAL | Status: DC

## 2013-05-20 NOTE — Progress Notes (Signed)
Discharge Note: Discharge instructions/prescriptions/medication samples given to patient. Patient verbalized understanding of discharge instructions and prescriptions. Returned belongings to patient. Denies SI/HI/AVH. Patient d/c without incident to the lobby. 

## 2013-05-20 NOTE — Progress Notes (Signed)
[facility] Adult Case Management Discharge Plan :  Will you be returning to the same living situation after discharge: Yes,  returning home At discharge, do you have transportation home?:Yes,  reports having his car here to drive home Do you have the ability to pay for your medications:Yes,  access to meds  Release of information consent forms completed and in the chart;  Patient's signature needed at discharge.  Patient to Follow up at: Follow-up Information   Follow up with [facility] On 05/21/2013. (Please to to the walk in clinic at [facility] on Thursday, May 21, 2013 or any weekday between 8Am - 12:00 Noon)    Contact information:   [address] [place], [place]   [address]  [phone]      Patient denies SI/HI:   Yes,  denies SI/HI    Safety Planning and Suicide Prevention discussed:  Yes,  discussed with pt and pt's father.  See suicide prevention education note.   Carmina Miller 05/20/2013, 10:47 AM

## 2013-05-20 NOTE — BHH Group Notes (Signed)
[facility] LCSW Aftercare Discharge Planning Group Note   05/20/2013 8:45 AM  Participation Quality:  Alert and Appropriate   Mood/Affect:  Appropriate  Depression Rating:  0  Anxiety Rating:  2  Thoughts of Suicide:  Pt denies SI/HI  Will you contract for safety?   Yes  Current AVH:  Pt denies  Plan for Discharge/Comments:  Pt attended discharge planning group and actively participated in group.  CSW provided pt with today's workbook.  Pt reports feeling stable to d/c today.  Pt will return home in [place].  Pt has follow up scheduled at [facility] [place] for medication management and therapy.  No further needs voiced by pt at this time.    Transportation Means: Pt reports access to transportation  Supports: No supports mentioned at this time  Reyes Ivan, LCSWA 05/20/2013 9:59 AM

## 2013-05-20 NOTE — Discharge Summary (Signed)
Physician Discharge Summary Note  Patient:  Steven Barker is an 27 y.o., male MRN:  [id number] DOB:  08/02/1986 Patient phone:  [phone] (home)  Patient address:   [address] [place] [address],   Date of Admission:  05/15/2013 Date of Discharge: 05/20/2013   Reason for Admission:  Depression with suicidal ideation.  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  Anxiety    .  Depression    .  Rheumatoid arthritis(714.0)     AXIS IV: problems related to legal system/crime, problems related to social environment, problems with access to health care services and problems with primary support group  AXIS V: 41-50 serious symptoms   Level of Care:  OP  Hospital Course:  Blandon presented to the ED reporting that he had suicidal ideation with a plan to hang himself. He reported previous attempts to hang himself in the recent past. He has a history of substance abuse but was less than forthcoming with information in the ED.       Upon arrival at the unit Matheson was agitated and angry threatening to "go off" on the staff. He was evaluated and his symptoms were documented. He was confronted with his results from the UDS completed in the ED and then admitted that he had lied in order to get into the hospital. He stated that he needed help for his RA and that he hadn't seen an MD since he was 27 years old. He has an extensive family history of substance abuse, and his father also tried to be admitted to the unit at the same time  Dewain was here.      Vihaan was oriented to the unit and medication management was initiated with low dose klonopin for his anxiety, Mobic for his arthritis pain, trazodone for sleep. He was educated that "going off" on staff was not acceptable behavior and he would be discharged.  He was encouraged to participate in unit programming to learn problem solving skills, coping skills, and relaxation techniques.     He responded well to being in a therapeutic environment and medication management. Dagan felt that returning home to the stressful environment there would be counter productive for him. He worked closely with the case manager to find placement for him elsewhere.       By the day of discharge Buford was in much improved condition and denied SI/HI and stated no AVH. He had no side effects to the medication and was motivated to continue taking them. He elected to follow up as noted below.  Consults:  None  Significant Diagnostic Studies:  None  Discharge Vitals:   Blood pressure 120/78, pulse 80, temperature 97.9 F (36.6 C), temperature source Oral, resp. rate 20, height 5' 5.75" (1.67 m), weight 56.246 kg (124 lb). Body mass index is 20.17 kg/(m^2). Lab Results:   No results found for this or any previous visit (from the past 72 hour(s)).  Physical Findings: AIMS: Facial and Oral Movements Muscles of Facial Expression: None, normal Lips and Perioral Area: None, normal Jaw: None, normal Tongue: None, normal,Extremity Movements Upper (  arms, wrists, hands, fingers): None, normal Lower (legs, knees, ankles, toes): None, normal, Trunk Movements Neck, shoulders, hips: None, normal, Overall Severity Severity of abnormal movements (highest score from questions above): None, normal Incapacitation due to abnormal movements: None, normal Patient's awareness of abnormal movements (rate only patient's report): No Awareness, Dental Status Current problems with teeth and/or dentures?: No Does  patient usually wear dentures?: No  CIWA:    COWS:     Psychiatric Specialty Exam: See Psychiatric Specialty Exam and Suicide Risk Assessment completed by Attending Physician prior to discharge.  Discharge destination:  Home  Is patient on multiple antipsychotic therapies at discharge:  No   Has Patient had three or more failed trials of antipsychotic monotherapy by history:  No  Recommended Plan for Multiple Antipsychotic Therapies: NA  Discharge Orders   Future Orders Complete By Expires   Diet - low sodium heart healthy  As directed    Discharge instructions  As directed    Comments:     Take all of your medications as directed. Be sure to keep all of your follow up appointments.  If you are unable to keep your follow up appointment, call your Doctor's office to let them know, and reschedule.  Make sure that you have enough medication to last until your appointment. Be sure to get plenty of rest. Going to bed at the same time each night will help. Try to avoid sleeping during the day.  Increase your activity as tolerated. Regular exercise will help you to sleep better and improve your mental health. Eating a heart healthy diet is recommended. Try to avoid salty or fried foods. Be sure to avoid all alcohol and illegal drugs.   Increase activity slowly  As directed        Medication List       Indication   clonazePAM 0.5 MG tablet  Commonly known as:  KLONOPIN  Take 1 tablet (0.5 mg total) by mouth 2 (two) times daily. For anxiety.   Indication:  Panic Disorder     meloxicam 7.5 MG tablet  Commonly known as:  MOBIC  Take 1 tablet (7.5 mg total) by mouth daily. For Arthritis pain.   Indication:  Rheumatoid Arthritis     QUEtiapine 25 MG tablet  Commonly known as:  SEROQUEL  Take 1 tablet (25 mg total) by mouth 3 (three) times daily as needed (anxiety).    breakthrough anxiety   QUEtiapine 300 MG tablet  Commonly known as:  SEROQUEL  Take 1 tablet (300 mg total) by mouth  at bedtime. For depression and mood stabilization.   Indication:  Depressive Phase of Manic-Depression, Trouble Sleeping           Follow-up Information   Follow up with [facility] On 05/21/2013. (Please to to the walk in clinic at [facility] on Thursday, May 21, 2013 or any weekday between 8Am - 12:00 Noon)    Contact information:   [address] [place], [place]   [address]  [phone]      Follow-up recommendations:   Activities: Resume activity as tolerated. Diet: Heart healthy low sodium diet Tests: Follow up testing will be determined by your out patient provider. Comments:   Total Discharge Time:  Greater than 30 minutes.  Signed: Rona Ravens. Mashburn RPAC 12:07 PM 05/20/2013  Patient was personally evaluated, completed suicide risk assessment, case discussed with treatment team and physician extender. Treatment plan developed. Reviewed the information documented and agree with the treatment plan.  Leelynn Whetsel,JANARDHAHA R. 05/20/2013 8:05 PM

## 2013-05-20 NOTE — Progress Notes (Signed)
Adult Psychoeducational Group Note  Date:  05/20/2013 Time:  10:00 11:00am Group Topic/Focus:  Personal Choices and Values:   The focus of this group is to help patients assess and explore the importance of values in their lives, how their values affect their decisions, how they express their values and what opposes their expression.  Participation Level:  Active  Participation Quality:  Appropriate and Attentive  Affect:  Appropriate  Cognitive:  Alert and Appropriate  Insight: Appropriate  Engagement in Group:  Engaged  Modes of Intervention:  Discussion and Education  Additional Comments:  Pt attended and participated in group. We discussed personal development and loving yourself. Pt shared with the group he would like to change his eating habits,change the people around me, take better care of himself emotionally. Pt stated he want s to gain weight and control his anger.  Shelly Bombard D 05/20/2013, 11:32 AM

## 2013-05-20 NOTE — Tx Team (Signed)
Interdisciplinary Treatment Plan Update (Adult)  Date: 05/20/2013  Time Reviewed:  9:45 AM  Progress in Treatment: Attending groups: Yes Participating in groups:  Yes Taking medication as prescribed:  Yes Tolerating medication:  Yes Family/Significant othe contact made: Yes, contact made with pt's father Patient understands diagnosis:  Yes Discussing patient identified problems/goals with staff:  Yes Medical problems stabilized or resolved:  Yes Denies suicidal/homicidal ideation: Yes Issues/concerns per patient self-inventory:  Yes Other:  New problem(s) identified: N/A  Discharge Plan or Barriers: Pt will follow up at [facility] the [place] for medication management and therapy.    Reason for Continuation of Hospitalization: Stable to d/c  Comments: N/A  Estimated length of stay: D/C today  For review of initial/current patient goals, please see plan of care.  Attendees: Patient:  Steven Barker  05/20/2013 10:44 AM   Family:     Physician:  Dr. Javier Glazier 05/20/2013 10:40 AM   Nursing:   Lowella Grip, RN 05/20/2013 10:40 AM   Clinical Social Worker:  Reyes Ivan, LCSWA 05/20/2013 10:40 AM   Other: Verne Spurr, PA 05/20/2013 10:40 AM   Other:  Frankey Shown, MA care coordination 05/20/2013 10:40 AM   Other:  Nestor Ramp, RN 05/20/2013 10:40 AM   Other:  Onnie Boer, RN 05/20/2013 10:41 AM   Other:    Other:    Other:    Other:    Other:    Other:     Scribe for Treatment Team:   Carmina Miller, 05/20/2013 10:40 AM

## 2013-05-20 NOTE — BHH Suicide Risk Assessment (Signed)
Suicide Risk Assessment  Discharge Assessment     Demographic Factors:  Male, Adolescent or young adult, Caucasian, Low socioeconomic status and Living alone  Mental Status Per Nursing Assessment::   On Admission:     Current Mental Status by Physician: NA  Loss Factors: Financial problems/change in socioeconomic status  Historical Factors: Prior suicide attempts, Family history of mental illness or substance abuse and Impulsivity  Risk Reduction Factors:   Sense of responsibility to family, Religious beliefs about death, Living with another person, especially a relative, Positive social support, Positive therapeutic relationship and Positive coping skills or problem solving skills  Continued Clinical Symptoms:  Alcohol/Substance Abuse/Dependencies Unstable or Poor Therapeutic Relationship Previous Psychiatric Diagnoses and Treatments  Cognitive Features That Contribute To Risk:  Polarized thinking    Suicide Risk:  Minimal: No identifiable suicidal ideation.  Patients presenting with no risk factors but with morbid ruminations; may be classified as minimal risk based on the severity of the depressive symptoms  Discharge Diagnoses:   AXIS I:  Substance Induced Mood Disorder and Polysubstance abuse AXIS II:  Deferred AXIS III:   Past Medical History  Diagnosis Date  . Anxiety   . Depression   . Rheumatoid arthritis(714.0)    AXIS IV:  economic problems, occupational problems, other psychosocial or environmental problems and problems related to social environment AXIS V:  61-70 mild symptoms  Plan Of Care/Follow-up recommendations:  Activity:  As tolerated Diet:  Regular  Is patient on multiple antipsychotic therapies at discharge:  No   Has Patient had three or more failed trials of antipsychotic monotherapy by history:  No  Recommended Plan for Multiple Antipsychotic Therapies: NA  Nehemiah Settle., M.D. 05/20/2013, 12:31 PM

## 2013-05-20 NOTE — Progress Notes (Signed)
Agree with assessment and plan Lashawn Orrego A. Jenya Putz, M.D. 

## 2013-05-25 NOTE — Progress Notes (Signed)
Patient Discharge Instructions:  After Visit Summary (AVS):   Faxed to:  05/25/13 Discharge Summary Note:   Faxed to:  05/25/13 Psychiatric Admission Assessment Note:   Faxed to:  05/25/13 Suicide Risk Assessment - Discharge Assessment:   Faxed to:  05/25/13 Faxed/Sent to the Next Level Care provider:  05/25/13 Faxed to [facility] @ [phone]  Jerelene Redden, 05/25/2013, 3:21 PM

## 2013-07-15 ENCOUNTER — Emergency Department (HOSPITAL_COMMUNITY): Payer: Self-pay

## 2013-07-15 ENCOUNTER — Emergency Department (HOSPITAL_COMMUNITY)
Admission: EM | Admit: 2013-07-15 | Discharge: 2013-07-16 | Disposition: A | Discharge status: Home / Self Care | Payer: Self-pay | Attending: Emergency Medicine | Admitting: Emergency Medicine

## 2013-07-15 ENCOUNTER — Encounter (HOSPITAL_COMMUNITY): Payer: Self-pay | Admitting: Emergency Medicine

## 2013-07-15 DIAGNOSIS — F17200 Nicotine dependence, unspecified, uncomplicated: Secondary | ICD-10-CM | POA: Insufficient documentation

## 2013-07-15 DIAGNOSIS — S161XXA Strain of muscle, fascia and tendon at neck level, initial encounter: Secondary | ICD-10-CM

## 2013-07-15 DIAGNOSIS — Z791 Long term (current) use of non-steroidal anti-inflammatories (NSAID): Secondary | ICD-10-CM | POA: Insufficient documentation

## 2013-07-15 DIAGNOSIS — F411 Generalized anxiety disorder: Secondary | ICD-10-CM | POA: Insufficient documentation

## 2013-07-15 DIAGNOSIS — Y9389 Activity, other specified: Secondary | ICD-10-CM | POA: Insufficient documentation

## 2013-07-15 DIAGNOSIS — S139XXA Sprain of joints and ligaments of unspecified parts of neck, initial encounter: Secondary | ICD-10-CM | POA: Insufficient documentation

## 2013-07-15 DIAGNOSIS — Z79899 Other long term (current) drug therapy: Secondary | ICD-10-CM | POA: Insufficient documentation

## 2013-07-15 DIAGNOSIS — F3289 Other specified depressive episodes: Secondary | ICD-10-CM | POA: Insufficient documentation

## 2013-07-15 DIAGNOSIS — F329 Major depressive disorder, single episode, unspecified: Secondary | ICD-10-CM | POA: Insufficient documentation

## 2013-07-15 DIAGNOSIS — Y92410 Unspecified street and highway as the place of occurrence of the external cause: Secondary | ICD-10-CM | POA: Insufficient documentation

## 2013-07-15 DIAGNOSIS — F1010 Alcohol abuse, uncomplicated: Secondary | ICD-10-CM | POA: Insufficient documentation

## 2013-07-15 DIAGNOSIS — M069 Rheumatoid arthritis, unspecified: Secondary | ICD-10-CM | POA: Insufficient documentation

## 2013-07-15 MED ORDER — FENTANYL CITRATE 0.05 MG/ML IJ SOLN
50.0000 ug | Freq: Once | INTRAMUSCULAR | Status: AC
  Administered 2013-07-16: 50 ug via INTRAVENOUS
  Filled 2013-07-15: qty 2
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
## 2013-07-16 LAB — URINALYSIS, ROUTINE W REFLEX MICROSCOPIC
Ketones, ur: NEGATIVE mg/dL
Leukocytes, UA: NEGATIVE
Nitrite: NEGATIVE
Protein, ur: NEGATIVE mg/dL
Specific Gravity, Urine: 1.01 (ref 1.005–1.030)
Urobilinogen, UA: 0.2 mg/dL (ref 0.0–1.0)
pH: 6 (ref 5.0–8.0)

## 2013-07-16 LAB — URINE MICROSCOPIC-ADD ON

## 2013-07-16 LAB — CBC WITH DIFFERENTIAL/PLATELET
Basophils Relative: 0 % (ref 0–1)
Eosinophils Absolute: 0 10*3/uL (ref 0.0–0.7)
Eosinophils Relative: 0 % (ref 0–5)
HCT: 43.6 % (ref 39.0–52.0)
Hemoglobin: 15.1 g/dL (ref 13.0–17.0)
Lymphs Abs: 1.6 10*3/uL (ref 0.7–4.0)
MCH: 32.4 pg (ref 26.0–34.0)
MCV: 93.6 fL (ref 78.0–100.0)
Monocytes Absolute: 1 10*3/uL (ref 0.1–1.0)
Monocytes Relative: 8 % (ref 3–12)
Neutrophils Relative %: 76 % (ref 43–77)
RBC: 4.66 MIL/uL (ref 4.22–5.81)
RDW: 14.1 % (ref 11.5–15.5)

## 2013-07-16 LAB — COMPREHENSIVE METABOLIC PANEL
BUN: 9 mg/dL (ref 6–23)
Calcium: 9 mg/dL (ref 8.4–10.5)
Chloride: 103 mEq/L (ref 96–112)
GFR calc Af Amer: >90 mL/min (ref >90)
GFR calc non Af Amer: >90 mL/min (ref >90)
Glucose, Bld: 87 mg/dL (ref 70–99)
Total Protein: 6.7 g/dL (ref 6.0–8.3)

## 2013-07-16 LAB — RAPID URINE DRUG SCREEN, HOSP PERFORMED
Amphetamines: NOT DETECTED
Barbiturates: NOT DETECTED
Cocaine: POSITIVE — AB

## 2013-07-16 LAB — ETHANOL: Alcohol, Ethyl (B): 77 mg/dL — ABNORMAL HIGH (ref 0–11)

## 2013-07-16 MED ORDER — KETOROLAC TROMETHAMINE 30 MG/ML IJ SOLN
30.0000 mg | Freq: Once | INTRAMUSCULAR | Status: AC
  Administered 2013-07-16: 30 mg via INTRAVENOUS
  Filled 2013-07-16: qty 1

## 2013-07-16 MED ORDER — METHOCARBAMOL 500 MG PO TABS: 500.0000 mg | ORAL_TABLET | Freq: Two times a day (BID) | ORAL | Status: DC

## 2013-07-16 MED ORDER — SODIUM CHLORIDE 0.9 % IV BOLUS (SEPSIS)
1000.0000 mL | Freq: Once | INTRAVENOUS | Status: AC
  Administered 2013-07-16: 1000 mL via INTRAVENOUS

## 2013-07-16 MED ORDER — IBUPROFEN 600 MG PO TABS: 600.0000 mg | ORAL_TABLET | Freq: Four times a day (QID) | ORAL | Status: DC | PRN

## 2013-09-01 ENCOUNTER — Emergency Department (HOSPITAL_BASED_OUTPATIENT_CLINIC_OR_DEPARTMENT_OTHER)
Admission: EM | Admit: 2013-09-01 | Discharge: 2013-09-01 | Disposition: A | Discharge status: Home / Self Care | Payer: Self-pay | Attending: Emergency Medicine | Admitting: Emergency Medicine

## 2013-09-01 ENCOUNTER — Encounter (HOSPITAL_BASED_OUTPATIENT_CLINIC_OR_DEPARTMENT_OTHER): Payer: Self-pay | Admitting: Emergency Medicine

## 2013-09-01 DIAGNOSIS — M069 Rheumatoid arthritis, unspecified: Secondary | ICD-10-CM | POA: Insufficient documentation

## 2013-09-01 DIAGNOSIS — K089 Disorder of teeth and supporting structures, unspecified: Secondary | ICD-10-CM | POA: Insufficient documentation

## 2013-09-01 DIAGNOSIS — F17200 Nicotine dependence, unspecified, uncomplicated: Secondary | ICD-10-CM | POA: Insufficient documentation

## 2013-09-01 DIAGNOSIS — F329 Major depressive disorder, single episode, unspecified: Secondary | ICD-10-CM | POA: Insufficient documentation

## 2013-09-01 DIAGNOSIS — F3289 Other specified depressive episodes: Secondary | ICD-10-CM | POA: Insufficient documentation

## 2013-09-01 DIAGNOSIS — Z79899 Other long term (current) drug therapy: Secondary | ICD-10-CM | POA: Insufficient documentation

## 2013-09-01 DIAGNOSIS — Z791 Long term (current) use of non-steroidal anti-inflammatories (NSAID): Secondary | ICD-10-CM | POA: Insufficient documentation

## 2013-09-01 DIAGNOSIS — K047 Periapical abscess without sinus: Secondary | ICD-10-CM | POA: Insufficient documentation

## 2013-09-01 DIAGNOSIS — K029 Dental caries, unspecified: Secondary | ICD-10-CM | POA: Insufficient documentation

## 2013-09-01 DIAGNOSIS — K0889 Other specified disorders of teeth and supporting structures: Secondary | ICD-10-CM

## 2013-09-01 DIAGNOSIS — F411 Generalized anxiety disorder: Secondary | ICD-10-CM | POA: Insufficient documentation

## 2013-09-01 DIAGNOSIS — K006 Disturbances in tooth eruption: Secondary | ICD-10-CM | POA: Insufficient documentation

## 2013-09-01 MED ORDER — CEFTRIAXONE SODIUM 1 G IJ SOLR
INTRAMUSCULAR | Status: AC
  Administered 2013-09-01: 1000 mg
  Filled 2013-09-01: qty 10

## 2013-09-01 MED ORDER — HYDROCODONE-ACETAMINOPHEN 5-325 MG PO TABS: 2.0000 | ORAL_TABLET | ORAL | Status: DC | PRN

## 2013-09-01 MED ORDER — DEXTROSE 5 % IV SOLN: 1.0000 g | INTRAVENOUS | Status: DC

## 2013-09-01 MED ORDER — AMOXICILLIN-POT CLAVULANATE 875-125 MG PO TABS: 1.0000 | ORAL_TABLET | Freq: Two times a day (BID) | ORAL | Status: DC

## 2013-09-01 NOTE — ED Provider Notes (Signed)
CSN: [id number]     Arrival date & time 09/01/13  1118 History   First MD Initiated Contact with Patient 09/01/13 1455     Chief Complaint  Patient presents with  . Dental Problem   (Consider location/radiation/quality/duration/timing/severity/associated sxs/prior Treatment) Patient is a 28 y.o. male presenting with tooth pain. The history is provided by the patient. No language interpreter was used.  Dental Pain Location:  Lower Lower teeth location:  30/RL 1st molar and 31/RL 2nd molar Quality:  Aching and sharp Severity:  Moderate Onset quality:  Gradual Duration:  1 week Timing:  Constant Context: abscess, dental caries and poor dentition   Relieved by:  Nothing Worsened by:  Nothing tried   Past Medical History  Diagnosis Date  . Anxiety   . Depression   . Rheumatoid arthritis(714.0)    History reviewed. No pertinent past surgical history. No family history on file. History  Substance Use Topics  . Smoking status: Current Every Day Smoker    Types: Cigarettes  . Smokeless tobacco: Not on file  . Alcohol Use: Yes    Review of Systems  HENT: Positive for dental problem.   All other systems reviewed and are negative.    Allergies  Clindamycin/lincomycin  Home Medications   Current Outpatient Rx  Name  Route  Sig  Dispense  Refill  . amoxicillin-clavulanate (AUGMENTIN) 875-125 MG per tablet   Oral   Take 1 tablet by mouth every 12 (twelve) hours.   14 tablet   0   . clonazePAM (KLONOPIN) 0.5 MG tablet   Oral   Take 1 tablet (0.5 mg total) by mouth 2 (two) times daily. For anxiety.   30 tablet   0   . HYDROcodone-acetaminophen (NORCO/VICODIN) 5-325 MG per tablet   Oral   Take 2 tablets by mouth every 4 (four) hours as needed.   20 tablet   0   . ibuprofen (ADVIL,MOTRIN) 600 MG tablet   Oral   Take 1 tablet (600 mg total) by mouth every 6 (six) hours as needed.   30 tablet   0   . meloxicam (MOBIC) 7.5 MG tablet   Oral   Take 1 tablet (7.5 mg  total) by mouth daily. For Arthritis pain.   30 tablet   0   . methocarbamol (ROBAXIN) 500 MG tablet   Oral   Take 1 tablet (500 mg total) by mouth 2 (two) times daily.   20 tablet   0   . QUEtiapine (SEROQUEL) 25 MG tablet   Oral   Take 1 tablet (25 mg total) by mouth 3 (three) times daily as needed (anxiety).   90 tablet   0   . QUEtiapine (SEROQUEL) 300 MG tablet   Oral   Take 1 tablet (300 mg total) by mouth at bedtime. For depression and mood stabilization.   30 tablet   0    BP 116/78  Pulse 72  Temp(Src) 97.8 F (36.6 C) (Oral)  Resp 16  Ht 5\' 7"  (1.702 m)  Wt 130 lb (58.968 kg)  BMI 20.36 kg/m2  SpO2 100% Physical Exam  Nursing note and vitals reviewed. Constitutional: He appears well-developed and well-nourished.  HENT:  Decayed teeth,  Swollen gum,  Face swollen  Eyes: Pupils are equal, round, and reactive to light.  Neck: Normal range of motion. Neck supple.  Cardiovascular: Normal rate.   Pulmonary/Chest: Effort normal.  Neurological: He is alert.  Skin: Skin is warm.    ED Course  Procedures (  including critical care time) Labs Review Labs Reviewed - No data to display Imaging Review No results found.  EKG Interpretation   None       MDM   1. Toothache    Pt given iv rocephin.   Rx for augmentin and hydrocodone.   Pt given referral to dentist for evaluation    Elson Areas, PA-C 09/01/13 1741
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
## 2013-09-01 NOTE — Discharge Instructions (Signed)

## 2013-09-01 NOTE — ED Provider Notes (Signed)
Medical screening examination/treatment/procedure(s) were performed by non-physician practitioner and as supervising physician I was immediately available for consultation/collaboration.  EKG Interpretation   None         Lakeeta Dobosz, MD 09/01/13 2303 

## 2013-10-14 ENCOUNTER — Encounter (HOSPITAL_COMMUNITY): Payer: Self-pay | Admitting: Emergency Medicine

## 2013-10-14 ENCOUNTER — Emergency Department (HOSPITAL_COMMUNITY)
Admission: EM | Admit: 2013-10-14 | Discharge: 2013-10-14 | Disposition: A | Discharge status: Home / Self Care | Payer: Self-pay | Attending: Emergency Medicine | Admitting: Emergency Medicine

## 2013-10-14 DIAGNOSIS — Z8659 Personal history of other mental and behavioral disorders: Secondary | ICD-10-CM | POA: Insufficient documentation

## 2013-10-14 DIAGNOSIS — Z113 Encounter for screening for infections with a predominantly sexual mode of transmission: Secondary | ICD-10-CM | POA: Insufficient documentation

## 2013-10-14 DIAGNOSIS — R21 Rash and other nonspecific skin eruption: Secondary | ICD-10-CM | POA: Insufficient documentation

## 2013-10-14 DIAGNOSIS — F17200 Nicotine dependence, unspecified, uncomplicated: Secondary | ICD-10-CM | POA: Insufficient documentation

## 2013-10-14 DIAGNOSIS — Z8739 Personal history of other diseases of the musculoskeletal system and connective tissue: Secondary | ICD-10-CM | POA: Insufficient documentation

## 2013-10-14 DIAGNOSIS — Z711 Person with feared health complaint in whom no diagnosis is made: Secondary | ICD-10-CM

## 2013-10-14 NOTE — ED Notes (Signed)
Pt here w/ girlfriend who is having STD sx.  States that he wants to get checked out too.  No sx.

## 2013-10-14 NOTE — Discharge Instructions (Signed)
Refrain from sexual intercourse for 7 days. Be sure to have all partners tested and treated for STDs. This may be performed at the health department or a primary care provider.  Practice safe sex by always wearing condoms.   Emergency Department Resource Guide 1) Find a Doctor and Pay Out of Pocket Although you won't have to find out who is covered by your insurance plan, it is a good idea to ask around and get recommendations. You will then need to call the office and see if the doctor you have chosen will accept you as a new patient and what types of options they offer for patients who are self-pay. Some doctors offer discounts or will set up payment plans for their patients who do not have insurance, but you will need to ask so you aren't surprised when you get to your appointment.  2) Contact Your Local Health Department Not all health departments have doctors that can see patients for sick visits, but many do, so it is worth a call to see if yours does. If you don't know where your local health department is, you can check in your phone book. The CDC also has a tool to help you locate your state's health department, and many state websites also have listings of all of their local health departments.  3) Find a Walk-in Clinic If your illness is not likely to be very severe or complicated, you may want to try a walk in clinic. These are popping up all over the country in pharmacies, drugstores, and shopping centers. They're usually staffed by nurse practitioners or physician assistants that have been trained to treat common illnesses and complaints. They're usually fairly quick and inexpensive. However, if you have serious medical issues or chronic medical problems, these are probably not your best option.  No Primary Care Doctor: - Call Health Connect at  [phone] - they can help you locate a primary care doctor that  accepts your insurance, provides certain services, etc. - Physician Referral  Service- [phone]  Chronic Pain Problems: Organization         Address  Phone   Notes  [facility]  [phone] Patients need to be referred by their primary care doctor.   Medication Assistance: Organization         Address  Phone   Notes  [facility] Medication [facility] [address] [place], [place] [id number] [phone] --Must be a resident of [place] -- Must have NO insurance coverage whatsoever (no Medicaid/ Medicare, etc.) -- The pt. MUST have a primary care doctor that directs their care regularly and follows them in the community   MedAssist  [phone]   [employer]  [phone]    Agencies that provide inexpensive medical care: Buyer, retailrganization         Address                                                       Phone  Notes  [facility]  [phone]   [facility]    [phone]   [facility] [address] [place], [place] [address] [phone]   [facility] [address], [place] [phone]   Planned Parenthood    ([phone]   [facility]    [phone]   [facility]  [address], [place] Phone:  [phone], Fax:  [phone] Hours of Operation:  9 am - 6 pm, M-F.  Also accepts Medicaid/Medicare and self-pay.  [facility]  [address], [place] Phone: [phone], Fax: [phone]. Hours of Operation:  8:30 am - 5:30 pm, M-F.  Also accepts Medicaid and self-pay.  [facility] [place] [address] [place] Phone: [phone]   [facility] [address] Natasha Bence [place], [place] [phone], Ext. [phone] Mondays & Thursdays: 7-9 AM.  First 15 patients are seen on a first come, first serve basis.    Medicaid-accepting  [facility] Providers:  Organization         Address                                                                       Phone                               Notes  [facility] [address], [place] [phone] Also accepts self-pay patients.  [facility] [address] Laurell Josephs [place], [place]  [phone]   [facility] [address], [place] [phone]   [facility] [address] [place] [phone]   Renaye Rakers [address], [place]   [phone] Only accepts [place] patients after they have their name applied to their card.   Self-Pay (no insurance) in [facility]:   Organization         Address                                                     Phone               Notes  Sickle Cell Patients, [facility] [address] [place], [place] [phone]   [facility] [address] [place], [place] [phone]   [facility] [place]  [address], [place] [phone]   [facility]/Dr. Osei-Bonsu  [address] [place] or [address], [place] [phone] Phone number for both [place] and [place] locations is the same.  [facility] [address], [place] [phone]   [facility] [address], [place] or [address] [phone]   [facility] [address], [place] [phone], phone; [phone], fax Sees patients 1st and 3rd Saturday of every month.  Must not qualify for public or private insurance (i.e. Medicaid, Medicare, [place] Health Choice, Veterans' Benefits)  Household income should be no more than 200% of the poverty level The clinic cannot treat you if you are pregnant or think you are pregnant   Sexually transmitted diseases are not treated at the clinic.    Dental Care: Organization         Address                                  Phone                       Notes  [facility] Department of [facility] [address] [place], [place] [phone] Accepts children up to age 69 who are enrolled in [place] or [place] Health Choice; pregnant women with a Medicaid card; and children who have applied for Medicaid or [place] Health Choice, but were declined, whose parents can pay a reduced fee at time of service.  [facility] Department of [facility]  [address], [place] [phone] Accepts children up to age 28 who are enrolled in [place] or [place] Health Choice; pregnant women with a Medicaid card; and children who have applied for Medicaid or [place] Health Choice, but were declined, whose parents can pay a reduced fee at time of service.  [facility]  [address] [place], [place] [phone] Patients are seen by appointment only. Walk-ins are not accepted. [facility] will see patients 69 years of age and older. Monday - Tuesday (8am-5pm) Most Wednesdays (8:30-5pm) $30 per visit, cash only  [facility] Adult Dental Access PROGRAM  [address], [facility] [phone] Patients are seen by appointment only. Walk-ins are not accepted. [facility] will see patients 64 years of age and older. One Wednesday Evening (Monthly: Volunteer Based).  $30 per visit, cash only  [employer]  [phone] for adults; Children under age 74, call Graduate Pediatric Dentistry at [phone]. Children aged 21-14, please call [phone] to request a pediatric application.  Dental services are provided in all areas of dental care including fillings, crowns and bridges, complete and partial dentures, implants, gum treatment, root canals, and extractions. Preventive care is also provided. Treatment  is provided to both adults and children. Patients are selected via a lottery and there is often a waiting list.   [facility] [address] [place]  [phone] [email or web address]   [facility] [address] [place], [place] [phone], Ext. [phone] Second and Fourth Thursday of each month, opens at 6:30 AM; Clinic ends at 9 AM.  Patients are seen on a first-come first-served basis, and a limited number are seen during each clinic.   [facility]  [address] Ether Griffins [place], [place] [phone]   Eligibility Requirements You must have lived in [place], [place], or [place] counties for at least the last three months.   You cannot be eligible for state or federal sponsored [place], including [employer], [place], or [employer].   You generally cannot be eligible for healthcare insurance through your employer.    How to apply: Eligibility screenings are held every  Tuesday and Wednesday afternoon from 1:00 pm until 4:00 pm. You do not need an appointment for the interview!  [facility] [address] [place], [place] [phone]   [facility] Health Department  [phone]   [facility] [place] Health Department  [phone]   [facility] Health Department  [phone]    Behavioral Health Resources in the Community: Intensive Outpatient Programs Organization         Address                                              Phone              Notes  [facility] [address], [place], [place] [phone]   [facility] Outpatient [address] [place], [place] [phone]   [facility] [address] [place], [place]  [phone]   [facility] [address],  [place], [place] [phone] or [phone]   Substance Abuse Resources Organization         Address                                Phone  Notes  Alcohol and  Drug Services  [phone]   [facility]  [phone]   [facility] [place]  [phone]   Floydene Flock  [phone]   Residential & Outpatient Substance Abuse Program  [phone]   Psychological Services Organization         Address                                  Phone                Notes  [facility] Behavioral Health  [phone]   [facility]  7243976998   [facility] Mental Health [address], [place] [phone] or [phone]    Mobile Crisis Teams Organization         Address  Phone  Notes  [facility], Mobile Crisis Care Unit  [phone]   [facility]  [address] [place], [place] [phone]   Doristine Locks [address] [place] [phone]    Self-Help/Support Groups Organization         Address                         Phone             Notes  [facility] [place] - variety of support groups  336- I7437963 Call for more information  Narcotics Anonymous (NA), [facility] [address], [employer] [place]  2 meetings at this location   Statistician         Address                                                    Phone              Notes  [facility] [phone]  [address],    [place]  [phone]   [facility]  [address] [place] [address], [place], [place] [phone]   [facility] [address] [place], [place] [phone] Admissions: 8am-3pm M-F  [facility] [address]    [place], [place] [phone]   [facility] [address] [place], [place], [place] [phone]   [facility] [address],  [place], [place] [phone]   [facility] - Intensive Outpatient [address] Laurell Joseph[address], [place], [place] [phone]   [facility] ([facility] [address]  [place], [place] [phone]  or [phone]   [facility] ([facility]) [address] [place], [place] [phone] Accepts Medicaid  [facility] [place] [address],  [place] [phone] Substance Abuse/Addiction Treatment   [facility] Organization         Address                                                            Phone                    Notes  [facility]  ([phone]) [phone]   Angie Fava, PhD [address] Ervin Knack [place], [place]   [phone] or [phone]   [facility]   [address] [place], [place] [phone]   [facility] [address], [place], [place] [phone] Insurance/Medicaid/sponsorship through [facility] and Families [address]                                    [place], [place] [phone] Therapy/tele-psych/case  [facility] [address], [place] [phone]    Dr. Lolly Mustache  [phone]   [facility]  [employer] [facility] Dept. 1) [address], [place] 2) [address] [place] 3)  [address], [place] [phone]  [phone]   [facility] Child Abuse Hotline 531-367-3702 or [phone] (After Hours)

## 2013-10-14 NOTE — ED Provider Notes (Signed)
CSN: [id number]     Arrival date & time 10/14/13  1556 History  This chart was scribed for Junius Finner, non-physician practitioner, working with Juliet Rude. Rubin Payor, MD, by Ellin Mayhew, ED Scribe. This patient was seen in room WTR6/WTR6 and the patient's care was started at 6:04 PM.  Chief Complaint  Patient presents with  . Exposure to STD   The history is provided by the patient. No language interpreter was used.   HPI Comments: Steven Barker is a 28 y.o. male who presents to the Emergency Department complaining of a penile rash with onset 3-4 days. Patient states his girlfriend confirms of having a recent yeast infection and that he does not use a condom during intercourse. Patient characterizes the rash as being itchy but non-painful in the penile or testicular area. Patient denies any penile discharge or fever. Patient reports that after using a cream three days ago, the rash has been progressively resolving; however, following his girlfriend's symptoms worsening, he became concerned. Patient has been tested for similar symptoms before. Patient confirms an allergy to clindamycin. He does not currently have a PCP or insurance.  Past Medical History  Diagnosis Date  . Anxiety   . Depression   . Rheumatoid arthritis(714.0)    No past surgical history on file. No family history on file. History  Substance Use Topics  . Smoking status: Current Every Day Smoker    Types: Cigarettes  . Smokeless tobacco: Not on file  . Alcohol Use: Yes    Review of Systems  Constitutional: Negative for fever and chills.  Respiratory: Negative for shortness of breath.   Gastrointestinal: Negative for nausea, vomiting, abdominal pain and diarrhea.  Endocrine: Negative for polyuria.  Genitourinary: Negative for dysuria, discharge, penile swelling, scrotal swelling, difficulty urinating, penile pain and testicular pain.  Skin: Positive for rash.  Neurological: Negative for weakness.  All other  systems reviewed and are negative.   Allergies  Clindamycin/lincomycin  Home Medications  No current outpatient prescriptions on file.  Triage Vitals: BP 108/72  Pulse 63  Temp(Src) 98.2 F (36.8 C) (Oral)  Resp 16  SpO2 98%  Physical Exam  Nursing note and vitals reviewed. Constitutional: He is oriented to person, place, and time. He appears well-developed and well-nourished.  HENT:  Head: Normocephalic and atraumatic.  Eyes: EOM are normal.  Neck: Normal range of motion.  Cardiovascular: Normal rate.   Pulmonary/Chest: Effort normal.  Genitourinary: Penis normal. Right testis shows no mass, no swelling and no tenderness. Left testis shows no mass, no swelling and no tenderness. No penile erythema or penile tenderness. No discharge found.  Musculoskeletal: Normal range of motion.  Neurological: He is alert and oriented to person, place, and time.  Skin: Skin is warm and dry. Rash (Dry, erythematous and flaky rash under pubic hairs. ) noted.  Psychiatric: He has a normal mood and affect. His behavior is normal.   ED Course  Procedures (including critical care time)  DIAGNOSTIC STUDIES: Oxygen Saturation is 98% on room air, normal by my interpretation.    COORDINATION OF CARE: 6:06 PM-Discussed typical treatment process for STDs with patient in [facility] involving a penile swab. Advised pt he will be informed of results from today's STD testing.  Also advised to have further STD testing and treatment of himself and his partners by a PCP or at the health department. Pt verbalized understanding and agreement with tx plan.  Labs Review Labs Reviewed  GC/CHLAMYDIA PROBE AMP   Imaging Review No results  found.  EKG Interpretation   None      MDM   Final diagnoses:  Concern about STD in male without diagnosis  Rash of groin    Pt c/o rash and concern for STD but denies dysuria, penile discharge, or tenderness. Rash has been resolving with use of "yeast infection cream."   GC/Chlamydia swab performed today. Advised he will be informed of results of test.  Also advised to f/u with PCP and health department as needed for further STD testing/treatment as needed by himself and partners. Return precautions provided. Pt verbalized understanding and agreement with tx plan.   I personally performed the services described in this documentation, which was scribed in my presence. The recorded information has been reviewed and is accurate.     Junius Finner, PA-C 10/15/13 1624

## 2013-10-15 LAB — GC/CHLAMYDIA PROBE AMP
CT Probe RNA: NEGATIVE
GC Probe RNA: NEGATIVE

## 2013-10-15 NOTE — ED Provider Notes (Signed)
Medical screening examination/treatment/procedure(s) were performed by non-physician practitioner and as supervising physician I was immediately available for consultation/collaboration.  EKG Interpretation   None        Juliet Rude. Rubin Payor, MD 10/15/13 [phone]

## 2013-12-31 ENCOUNTER — Encounter (HOSPITAL_COMMUNITY): Payer: Self-pay

## 2013-12-31 ENCOUNTER — Emergency Department (HOSPITAL_COMMUNITY)
Admission: EM | Admit: 2013-12-31 | Discharge: 2013-12-31 | Disposition: A | Discharge status: Other Acute Inpatient Hospital | Payer: Self-pay | Attending: Emergency Medicine | Admitting: Emergency Medicine

## 2013-12-31 ENCOUNTER — Encounter (HOSPITAL_COMMUNITY): Payer: Self-pay | Admitting: Emergency Medicine

## 2013-12-31 ENCOUNTER — Inpatient Hospital Stay (HOSPITAL_COMMUNITY)
Admission: AD | Admit: 2013-12-31 | Discharge: 2014-01-04 | DRG: 885 | Disposition: A | Discharge status: Home / Self Care | Payer: Federal, State, Local not specified - Other | Source: Intra-hospital | Attending: Psychiatry | Admitting: Psychiatry

## 2013-12-31 DIAGNOSIS — F17200 Nicotine dependence, unspecified, uncomplicated: Secondary | ICD-10-CM | POA: Insufficient documentation

## 2013-12-31 DIAGNOSIS — Z87820 Personal history of traumatic brain injury: Secondary | ICD-10-CM

## 2013-12-31 DIAGNOSIS — F332 Major depressive disorder, recurrent severe without psychotic features: Principal | ICD-10-CM | POA: Diagnosis present

## 2013-12-31 DIAGNOSIS — F39 Unspecified mood [affective] disorder: Secondary | ICD-10-CM | POA: Diagnosis present

## 2013-12-31 DIAGNOSIS — R45851 Suicidal ideations: Secondary | ICD-10-CM

## 2013-12-31 DIAGNOSIS — Z8739 Personal history of other diseases of the musculoskeletal system and connective tissue: Secondary | ICD-10-CM | POA: Insufficient documentation

## 2013-12-31 DIAGNOSIS — F1410 Cocaine abuse, uncomplicated: Secondary | ICD-10-CM | POA: Insufficient documentation

## 2013-12-31 DIAGNOSIS — F1910 Other psychoactive substance abuse, uncomplicated: Secondary | ICD-10-CM

## 2013-12-31 DIAGNOSIS — F1994 Other psychoactive substance use, unspecified with psychoactive substance-induced mood disorder: Secondary | ICD-10-CM | POA: Diagnosis present

## 2013-12-31 DIAGNOSIS — F1210 Cannabis abuse, uncomplicated: Secondary | ICD-10-CM | POA: Insufficient documentation

## 2013-12-31 DIAGNOSIS — F411 Generalized anxiety disorder: Secondary | ICD-10-CM | POA: Diagnosis present

## 2013-12-31 DIAGNOSIS — F1920 Other psychoactive substance dependence, uncomplicated: Secondary | ICD-10-CM | POA: Diagnosis present

## 2013-12-31 DIAGNOSIS — F3289 Other specified depressive episodes: Secondary | ICD-10-CM | POA: Insufficient documentation

## 2013-12-31 DIAGNOSIS — M069 Rheumatoid arthritis, unspecified: Secondary | ICD-10-CM | POA: Diagnosis present

## 2013-12-31 DIAGNOSIS — F1510 Other stimulant abuse, uncomplicated: Secondary | ICD-10-CM | POA: Insufficient documentation

## 2013-12-31 DIAGNOSIS — G4700 Insomnia, unspecified: Secondary | ICD-10-CM | POA: Diagnosis present

## 2013-12-31 DIAGNOSIS — F410 Panic disorder [episodic paroxysmal anxiety] without agoraphobia: Secondary | ICD-10-CM | POA: Diagnosis present

## 2013-12-31 DIAGNOSIS — F1310 Sedative, hypnotic or anxiolytic abuse, uncomplicated: Secondary | ICD-10-CM | POA: Insufficient documentation

## 2013-12-31 DIAGNOSIS — F329 Major depressive disorder, single episode, unspecified: Secondary | ICD-10-CM | POA: Insufficient documentation

## 2013-12-31 LAB — COMPREHENSIVE METABOLIC PANEL
ALBUMIN: 3.8 g/dL (ref 3.5–5.2)
ALK PHOS: 76 U/L (ref 39–117)
ALT: 10 U/L (ref 0–53)
AST: 18 U/L (ref 0–37)
BUN: 9 mg/dL (ref 6–23)
CO2: 30 mEq/L (ref 19–32)
Calcium: 9.2 mg/dL (ref 8.4–10.5)
Chloride: 102 mEq/L (ref 96–112)
Creatinine, Ser: 1.15 mg/dL (ref 0.50–1.35)
GFR calc Af Amer: >90 mL/min (ref >90)
GFR calc non Af Amer: 87 mL/min — ABNORMAL LOW (ref >90)
Glucose, Bld: 91 mg/dL (ref 70–99)
POTASSIUM: 4.2 meq/L (ref 3.7–5.3)
SODIUM: 142 meq/L (ref 137–147)
Total Bilirubin: 0.4 mg/dL (ref 0.3–1.2)
Total Protein: 6.8 g/dL (ref 6.0–8.3)

## 2013-12-31 LAB — ETHANOL: Alcohol, Ethyl (B): <11 mg/dL (ref 0–11)

## 2013-12-31 LAB — CBC
HCT: 44.8 % (ref 39.0–52.0)
HEMOGLOBIN: 15.1 g/dL (ref 13.0–17.0)
MCH: 31.5 pg (ref 26.0–34.0)
MCHC: 33.7 g/dL (ref 30.0–36.0)
MCV: 93.3 fL (ref 78.0–100.0)
PLATELETS: 240 10*3/uL (ref 150–400)
RBC: 4.8 MIL/uL (ref 4.22–5.81)
RDW: 13 % (ref 11.5–15.5)
WBC: 6.7 10*3/uL (ref 4.0–10.5)

## 2013-12-31 LAB — RAPID URINE DRUG SCREEN, HOSP PERFORMED
Amphetamines: POSITIVE — AB
BARBITURATES: NOT DETECTED
BENZODIAZEPINES: POSITIVE — AB
COCAINE: POSITIVE — AB
Opiates: NOT DETECTED
TETRAHYDROCANNABINOL: POSITIVE — AB

## 2013-12-31 LAB — SALICYLATE LEVEL: Salicylate Lvl: <2 mg/dL — ABNORMAL LOW (ref 2.8–20.0)

## 2013-12-31 LAB — ACETAMINOPHEN LEVEL

## 2013-12-31 MED ORDER — HYDROXYZINE HCL 25 MG PO TABS: 25.0000 mg | ORAL_TABLET | Freq: Four times a day (QID) | ORAL | Status: DC | PRN

## 2013-12-31 MED ORDER — MAGNESIUM HYDROXIDE 400 MG/5ML PO SUSP: 30.0000 mL | Freq: Every day | ORAL | Status: DC | PRN

## 2013-12-31 MED ORDER — ONDANSETRON HCL 4 MG PO TABS: 4.0000 mg | ORAL_TABLET | Freq: Three times a day (TID) | ORAL | Status: DC | PRN

## 2013-12-31 MED ORDER — LORAZEPAM 1 MG PO TABS: 1.0000 mg | ORAL_TABLET | Freq: Three times a day (TID) | ORAL | Status: DC | PRN

## 2013-12-31 MED ORDER — ALUM & MAG HYDROXIDE-SIMETH 200-200-20 MG/5ML PO SUSP: 30.0000 mL | ORAL | Status: DC | PRN

## 2013-12-31 MED ORDER — ACETAMINOPHEN 325 MG PO TABS: 650.0000 mg | ORAL_TABLET | ORAL | Status: DC | PRN

## 2013-12-31 MED ORDER — QUETIAPINE FUMARATE 100 MG PO TABS
100.0000 mg | ORAL_TABLET | Freq: Every day | ORAL | Status: DC
  Administered 2013-12-31 – 2014-01-01 (×2): 100 mg via ORAL
  Filled 2013-12-31 (×4): qty 1

## 2013-12-31 MED ORDER — IBUPROFEN 200 MG PO TABS: 600.0000 mg | ORAL_TABLET | Freq: Three times a day (TID) | ORAL | Status: DC | PRN

## 2013-12-31 MED ORDER — ZOLPIDEM TARTRATE 5 MG PO TABS: 5.0000 mg | ORAL_TABLET | Freq: Every evening | ORAL | Status: DC | PRN

## 2013-12-31 MED ORDER — TRAZODONE HCL 50 MG PO TABS: 50.0000 mg | ORAL_TABLET | Freq: Every evening | ORAL | Status: DC | PRN

## 2013-12-31 MED ORDER — NICOTINE 21 MG/24HR TD PT24
21.0000 mg | MEDICATED_PATCH | Freq: Every day | TRANSDERMAL | Status: DC
  Administered 2013-12-31: 21 mg via TRANSDERMAL
  Filled 2013-12-31: qty 1

## 2013-12-31 NOTE — Tx Team (Signed)
Initial Interdisciplinary Treatment Plan  PATIENT STRENGTHS: (choose at least two) General fund of knowledge Supportive family/friends  PATIENT STRESSORS: Financial difficulties Medication change or noncompliance Substance abuse   PROBLEM LIST: Problem List/Patient Goals Date to be addressed Date deferred Reason deferred Estimated date of resolution  Risk for suicide 12/31/13     Medication non-compliance 12/31/13     SA 12/31/13     Financial stress  12/31/13                                    DISCHARGE CRITERIA:  Improved stabilization in mood, thinking, and/or behavior  PRELIMINARY DISCHARGE PLAN: Attend aftercare/continuing care group Attend PHP/IOP Attend 12-step recovery group  PATIENT/FAMIILY INVOLVEMENT: This treatment plan has been presented to and reviewed with the patient, Steven Barker.  The patient and family have been given the opportunity to ask questions and make suggestions.  [name] 12/31/2013, 9:34 PM

## 2013-12-31 NOTE — ED Notes (Signed)
Report called to RN [place], [facility], rm [phone].  Pending [facility] transport.

## 2013-12-31 NOTE — Progress Notes (Signed)
Patient ID: Steven Barker, male   DOB: 08/20/1986, 28 y.o.   MRN: [id number]  D:  Pt stated he has been taking klonopin x 6 months off the street. Pt denies SI/HI/AVH. Pt is pleasant and cooperative. Pt stated he was off his meds since leaving [facility] the last time. Pt stated he took his meds until he ran out, but could not afford to refill them. Pt stated his fiance did not want him around her kids in his current condition. 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   A: Pt was offered support and encouragement. Pt was given scheduled medications. Pt was encourage to attend groups. Q 15 minute checks were done for safety.   R:Pt attends groups and interacts well with peers and staff. Pt is taking medication. Pt has no complaints at this time.Pt receptive to treatment and safety maintained on unit.

## 2013-12-31 NOTE — ED Notes (Signed)
Pt has two bags of belongings both wanded by security, tagged and transported to psych ed with pt and given to [employer]

## 2013-12-31 NOTE — ED Provider Notes (Signed)
CSN: [id number]     Arrival date & time 12/31/13  1508 History   First MD Initiated Contact with Patient 12/31/13 1541     Chief Complaint  Patient presents with  . Medical Clearance  . Suicidal     (Consider location/radiation/quality/duration/timing/severity/associated sxs/prior Treatment) HPI Steven Barker is a 28 y.o. male who presents to emergency department complaining of suicidal thoughts. Patient states he has a lot going on in his life right now, he states he is depressed, using cocaine, marijuana, street drugs. States recently has had some thoughts about hurting himself. He does not have a plan at this time. He states about a month ago he did try to hang himself but couldn't go through. He reports being a behavioral health approximately 8 months ago. She was started on medications but he stopped taking after he was discharged. Patient denies any medical complaints. He denies any homicidal thoughts. He states he last used today. He denies any alcohol. He denies any primary care doctor or psychiatrist.  Past Medical History  Diagnosis Date  . Anxiety   . Depression   . Rheumatoid arthritis(714.0)    History reviewed. No pertinent past surgical history. No family history on file. History  Substance Use Topics  . Smoking status: Current Every Day Smoker    Types: Cigarettes  . Smokeless tobacco: Not on file  . Alcohol Use: Yes    Review of Systems  Constitutional: Negative for fever and chills.  Respiratory: Negative for cough, chest tightness and shortness of breath.   Cardiovascular: Negative for chest pain, palpitations and leg swelling.  Gastrointestinal: Negative for nausea, vomiting, abdominal pain, diarrhea and abdominal distention.  Genitourinary: Negative for dysuria, urgency, frequency and hematuria.  Musculoskeletal: Negative for arthralgias, myalgias, neck pain and neck stiffness.  Skin: Negative for rash.  Allergic/Immunologic: Negative for immunocompromised  state.  Neurological: Negative for dizziness, weakness, light-headedness, numbness and headaches.  Psychiatric/Behavioral: Positive for suicidal ideas. The patient is nervous/anxious.   All other systems reviewed and are negative.     Allergies  Clindamycin/lincomycin  Home Medications   Prior to Admission medications   Not on File   BP 98/59  Pulse 88  Temp(Src) 97.9 F (36.6 C) (Oral)  Resp 18  SpO2 100% Physical Exam  Nursing note and vitals reviewed. Constitutional: He is oriented to person, place, and time. He appears well-developed and well-nourished. No distress.  HENT:  Head: Normocephalic and atraumatic.  Eyes: Conjunctivae are normal.  Neck: Neck supple.  Cardiovascular: Normal rate, regular rhythm and normal heart sounds.   Pulmonary/Chest: Effort normal. No respiratory distress. He has no wheezes. He has no rales.  Abdominal: Soft. Bowel sounds are normal. He exhibits no distension. There is no tenderness. There is no rebound.  Musculoskeletal: He exhibits no edema.  Neurological: He is alert and oriented to person, place, and time.  Skin: Skin is warm and dry.  Psychiatric: He has a normal mood and affect. His behavior is normal. Thought content normal.    ED Course  Procedures (including critical care time) Labs Review Labs Reviewed  COMPREHENSIVE METABOLIC PANEL - Abnormal; Notable for the following:    GFR calc non Af Amer 87 (*)    All other components within normal limits  SALICYLATE LEVEL - Abnormal; Notable for the following:    Salicylate Lvl <2.0 (*)    All other components within normal limits  URINE RAPID DRUG SCREEN (HOSP PERFORMED) - Abnormal; Notable for the following:    Cocaine POSITIVE (*)  Benzodiazepines POSITIVE (*)    Amphetamines POSITIVE (*)    Tetrahydrocannabinol POSITIVE (*)    All other components within normal limits  ACETAMINOPHEN LEVEL  CBC  ETHANOL    Imaging Review No results found.   EKG  Interpretation None      MDM   Final diagnoses:  Polysubstance abuse    Pt is suicidal, here voluntarily. He is NOT involuntary commited but cannot leave. Also polysubstance abuse. He is medically cleared. Will get TTS to consult.    Filed Vitals:   12/31/13 1522 12/31/13 1830  BP: 98/59 107/60  Pulse: 88 81  Temp: 97.9 F (36.6 C) 97.5 F (36.4 C)  TempSrc: Oral Oral  Resp: 18 16  SpO2: 100% 98%       Myriam Jacobson Amarion Portell, PA-C 01/01/14 0037

## 2013-12-31 NOTE — ED Notes (Signed)
[facility] transport requested. 

## 2013-12-31 NOTE — ED Notes (Signed)
Pt here for med clearance, states he is SI, thoughts w/o plan. States about 1 month ago he tried to hang himself. Denies HI. Has been off meds for about 7 months.

## 2013-12-31 NOTE — Progress Notes (Signed)
CSW confirmed that the voluntary paperwork was completed correctly and faxed to [facility].       Maryelizabeth Rowan, MSW, [place], 12/31/2013 Evening Clinical Social Worker [phone]

## 2013-12-31 NOTE — BH Assessment (Signed)
Assessment Note   Steven Barker is a 28 year old white male with suicidal ideations without a plan.   Patient reports that he is not able to contract for safety.  Patient reports that he recently relapsed on drugs.  Patient reports that while he was using drugs he was thinking about harming himself.     Patient UDS was positive for cocaine, benzos, amphetamines and marijuana.  Patient reports that he does not know how much he used yesterday.  Patient reports that he uses on a regular basis.  Patient reports that he has been abusing drugs since the age of 46.  Patient denies prior substance abuse detox or treatment.  Patient reports that he knows that he needs help to stop using.  Patient reports if he was back on his medication then he would not use so many drugs.     Patient reports that he attempted to hang himself 2 months ago due to increased feelings of depression. Patient was not able to state any stressors causing the depression.   Patient reports that he was discharged from [facility] in September 2014.  Patient reports that he has not been compliant with his medication management from [facility].  Patient reports that he stopped taking his medication once he was discharged from [facility].     Patient denies HI.  Patient denies psychosis.  Patient denies physical, sexual or emotional abuse.    Axis I: Major Depression, Recurrent severe and Opiate Dependence  Axis II: Deferred Axis III:  Past Medical History  Diagnosis Date  . Anxiety   . Depression   . Rheumatoid arthritis(714.0)    Axis IV: economic problems, housing problems, occupational problems, other psychosocial or environmental problems, problems related to social environment, problems with access to health care services and problems with primary support group Axis V: 31-40 impairment in reality testing  Past Medical History:  Past Medical History  Diagnosis Date  . Anxiety   . Depression   . Rheumatoid arthritis(714.0)     History  reviewed. No pertinent past surgical history.  Family History: No family history on file.  Social History:  reports that he has been smoking Cigarettes.  He has been smoking about 0.00 packs per day. He does not have any smokeless tobacco history on file. He reports that he drinks alcohol. He reports that he uses illicit drugs (Marijuana).  Additional Social History:     CIWA: CIWA-Ar BP: 107/60 mmHg Pulse Rate: 81 COWS:    Allergies:  Allergies  Allergen Reactions  . Clindamycin/Lincomycin Nausea And Vomiting    Patient stated that he felt like he was going to die    Home Medications:  (Not in a hospital admission)  OB/GYN Status:  No LMP for male patient.  General Assessment Data Location of Assessment: [facility] Is this a Tele or Face-to-Face Assessment?: Face-to-Face Is this an Initial Assessment or a Re-assessment for this encounter?: Initial Assessment Living Arrangements: Parent;Other relatives Can pt return to current living arrangement?: Yes Admission Status: Voluntary Is patient capable of signing voluntary admission?: Yes Transfer from: Acute Hospital [facility]) Referral Source: Self/Family/Friend  Medical Screening Exam [facility] Walk-in ONLY) Medical Exam completed: Yes  [facility] Crisis Care Plan Living Arrangements: Parent;Other relatives Name of Psychiatrist: Vesta Mixer  Name of Therapist: [facility]   Education Status Is patient currently in school?: No Current Grade: NA Highest grade of school patient has completed: NA Name of school: NA Contact person: NA  Risk to self Suicidal Ideation: Yes-Currently Present  Suicidal Intent: Yes-Currently Present Is patient at risk for suicide?: Yes Suicidal Plan?: No Access to Means: No What has been your use of drugs/alcohol within the last 12 months?: Cocaine, Benzo, Amphet, Cannabis Previous Attempts/Gestures: Yes How many times?: 1 Other Self Harm Risks: NA Triggers for Past Attempts: None known Intentional Self  Injurious Behavior: None Family Suicide History: Yes Recent stressful life event(s): Job Loss;Financial Problems;Legal Issues Persecutory voices/beliefs?: No Depression: Yes Depression Symptoms: Despondent;Tearfulness;Fatigue;Guilt;Feeling worthless/self pity;Feeling angry/irritable Substance abuse history and/or treatment for substance abuse?: Yes Suicide prevention information given to non-admitted patients: Yes  Risk to Others Homicidal Ideation: No Thoughts of Harm to Others: No Current Homicidal Intent: No Current Homicidal Plan: No Access to Homicidal Means: No Identified Victim: NA History of harm to others?: No Assessment of Violence: None Noted Violent Behavior Description: Calm Does patient have access to weapons?: No Criminal Charges Pending?: No Does patient have a court date: Yes Court Date: 01/22/14 (02/09/2014)  Psychosis Hallucinations: None noted Delusions: None noted  Mental Status Report Appear/Hygiene: Disheveled;Poor hygiene Eye Contact: Fair Motor Activity: Freedom of movement;Restlessness Speech: Logical/coherent Level of Consciousness: Alert;Restless Mood: Depressed;Anxious Affect: Anxious Anxiety Level: Minimal Thought Processes: Coherent;Relevant Judgement: Unimpaired Orientation: Person;Place;Time;Situation Obsessive Compulsive Thoughts/Behaviors: None  Cognitive Functioning Concentration: Decreased Memory: Recent Intact;Remote Intact IQ: Average Insight: Fair Impulse Control: Poor Appetite: Fair Weight Loss: 0 Weight Gain: 0 Sleep: Decreased Total Hours of Sleep: 4 Vegetative Symptoms: Decreased grooming;Not bathing;Staying in bed  ADLScreening [facility]) Patient's cognitive ability adequate to safely complete daily activities?: Yes Patient able to express need for assistance with ADLs?: Yes Independently performs ADLs?: Yes (appropriate for developmental age)  Prior Inpatient Therapy Prior Inpatient Therapy:  Yes Prior Therapy Dates: 04-2013 Prior Therapy Facilty/Provider(s): [facility] Reason for Treatment: SI, and Substance Abuse  Prior Outpatient Therapy Prior Outpatient Therapy: Yes Prior Therapy Dates: Ongoing  Prior Therapy Facilty/Provider(s): [facility]  Reason for Treatment: Medication Manageent  ADL Screening (condition at time of admission) Patient's cognitive ability adequate to safely complete daily activities?: Yes Patient able to express need for assistance with ADLs?: Yes Independently performs ADLs?: Yes (appropriate for developmental age)         Values / Beliefs Cultural Requests During Hospitalization: None Spiritual Requests During Hospitalization: None        Additional Information 1:1 In Past 12 Months?: No CIRT Risk: No Elopement Risk: No Does patient have medical clearance?: Yes     Disposition: Per , Dr. Shela Commons patient  Disposition Initial Assessment Completed for this Encounter: Yes Disposition of Patient: Inpatient treatment program Type of inpatient treatment program: Adult  On Site Evaluation by:   Reviewed with Physician:    Clyde Canterbury 12/31/2013 6:44 PM

## 2014-01-01 ENCOUNTER — Encounter (HOSPITAL_COMMUNITY): Payer: Self-pay | Admitting: Psychiatry

## 2014-01-01 DIAGNOSIS — F1994 Other psychoactive substance use, unspecified with psychoactive substance-induced mood disorder: Secondary | ICD-10-CM

## 2014-01-01 DIAGNOSIS — R45851 Suicidal ideations: Secondary | ICD-10-CM

## 2014-01-01 DIAGNOSIS — F411 Generalized anxiety disorder: Secondary | ICD-10-CM | POA: Diagnosis present

## 2014-01-01 DIAGNOSIS — F39 Unspecified mood [affective] disorder: Secondary | ICD-10-CM

## 2014-01-01 MED ORDER — ENSURE COMPLETE PO LIQD: 237.0000 mL | Freq: Two times a day (BID) | ORAL | Status: DC

## 2014-01-01 MED ORDER — CLONAZEPAM 0.5 MG PO TABS
0.5000 mg | ORAL_TABLET | Freq: Two times a day (BID) | ORAL | Status: DC | PRN
  Administered 2014-01-01 – 2014-01-04 (×7): 0.5 mg via ORAL
  Filled 2014-01-01 (×7): qty 1

## 2014-01-01 MED ORDER — ENSURE COMPLETE PO LIQD
237.0000 mL | Freq: Two times a day (BID) | ORAL | Status: DC
  Administered 2014-01-01 – 2014-01-04 (×5): 237 mL via ORAL

## 2014-01-01 MED ORDER — NICOTINE 21 MG/24HR TD PT24
MEDICATED_PATCH | TRANSDERMAL | Status: AC
  Administered 2014-01-01: 14:00:00
  Filled 2014-01-01: qty 1

## 2014-01-01 MED ORDER — QUETIAPINE FUMARATE 100 MG PO TABS
50.0000 mg | ORAL_TABLET | Freq: Two times a day (BID) | ORAL | Status: DC
  Administered 2014-01-01 – 2014-01-04 (×7): 50 mg via ORAL
  Filled 2014-01-01 (×4): qty 1
  Filled 2014-01-01: qty 42
  Filled 2014-01-01 (×7): qty 1
  Filled 2014-01-01: qty 42

## 2014-01-01 NOTE — [facility] Suicide Risk Assessment (Signed)
Suicide Risk Assessment  Admission Assessment     Nursing information obtained from:    Demographic factors:    Current Mental Status:    Loss Factors:    Historical Factors:    Risk Reduction Factors:    Total Time spent with patient: 45 minutes  CLINICAL FACTORS:   Depression:   Comorbid alcohol abuse/dependence Impulsivity Alcohol/Substance Abuse/Dependencies  PCOGNITIVE FEATURES THAT CONTRIBUTE TO RISK:  Closed-mindedness Polarized thinking Thought constriction (tunnel vision)    SUICIDE RISK:   Moderate:  Frequent suicidal ideation with limited intensity, and duration, some specificity in terms of plans, no associated intent, good self-control, limited dysphoria/symptomatology, some risk factors present, and identifiable protective factors, including available and accessible social support.  PLAN OF CARE: Supportive approach/coping skills/relaps prevention                              Seroquel 50 mg BID. 100 mg HS                               Klonopin 0.5 mg BID PRN    I certify that inpatient services furnished can reasonably be expected to improve the patient's condition.  Rachael Fee 01/01/2014, 12:53 PM

## 2014-01-01 NOTE — Progress Notes (Signed)
Patient ID: Steven Barker, male   DOB: 02-07-1986, 28 y.o.   MRN: [id number] Pt with minimal visibility in the milieu. Cooperative.  Needs assessed.  Pt denied.  Fifteen minute checks in progress for patient safety.  Pt safe on unit.

## 2014-01-01 NOTE — [facility] Group Notes (Signed)
Adult Psychoeducational Group Note  Date:  01/01/2014 Time:  10:30 PM  Group Topic/Focus:  AA Meeting  Participation Level:  None  Participation Quality:  Drowsy  Affect:  Flat and Sleepy  Cognitive:  None  Insight: None  Engagement in Group:  None  Modes of Intervention:  Discussion and Education  Additional Comments:  Steven Barker attended group but was very drowsy.  Demisha Nokes A Rhyatt Muska 01/01/2014, 10:30 PM

## 2014-01-01 NOTE — ED Provider Notes (Signed)
Medical screening examination/treatment/procedure(s) were performed by non-physician practitioner and as supervising physician I was immediately available for consultation/collaboration.  Flint Melter, MD 01/01/14 [phone]

## 2014-01-01 NOTE — Progress Notes (Addendum)
Pt stated he felt nervous and needed a Klonopin. Pt was given this medication at 1322.Pt appeared very tired and sleepy but told the nurse he must have his prn clonipin for his nerves. Pt was informed that he can only have two doses in a 24 hour period and that he has reached his limit after this dose. Pt was insistent on taking this medication and it was given to him.He does not appear to have any withdrawal symptoms and has been napping.

## 2014-01-01 NOTE — Tx Team (Signed)
Interdisciplinary Treatment Plan Update (Adult)  Date: 01/01/2014   Time Reviewed: 11:22 AM  Progress in Treatment:  Attending groups: No.  Participating in groups:  No.  Taking medication as prescribed: Yes  Tolerating medication: Yes  Family/Significant othe contact made: Not yet. SPE required for pt. Pt requesting that CSW get grandmother's number on Monday, as she is his only identified support and does not have phone at present.   Patient understands diagnosis: Yes, AEB seeking treatment for benzo detox/amphetamine abuse/cocaine abuse, depression/mood stabilization, and passive SI.  Discussing patient identified problems/goals with staff: Yes  Medical problems stabilized or resolved: Yes  Denies suicidal/homicidal ideation: Yes during self report.  Patient has not harmed self or Others: Yes  New problem(s) identified:  Discharge Plan or Barriers: Pt reports that he wants to return to his grandmother's home at d/c and follow up at [facility]. Pt not interested in any further followup and is minimally invested in aftercare/discharge planning with CSW at this time.  Additional comments: Steven Barker is a 28 year old white male with suicidal ideations without a plan. Patient reports that he is not able to contract for safety. Patient reports that he recently relapsed on drugs. Patient reports that while he was using drugs he was thinking about harming himself.  Patient UDS was positive for cocaine, benzos, amphetamines and marijuana. Patient reports that he does not know how much he used yesterday. Patient reports that he uses on a regular basis. Patient reports that he has been abusing drugs since the age of 38. Patient denies prior substance abuse detox or treatment. Patient reports that he knows that he needs help to stop using. Patient reports if he was back on his medication then he would not use so many drugs. Patient reports that he attempted to hang himself 2 months ago due to increased feelings of  depression. Patient was not able to state any stressors causing the depression. Patient reports that he was discharged from [facility] in September 2014. Patient reports that he has not been compliant with his medication management from [facility]. Patient reports that he stopped taking his medication once he was discharged from [facility].  Patient denies HI. Patient denies psychosis. Patient denies physical, sexual or emotional abuse.  Reason for Continuation of Hospitalization: Mood stabilization Medication management Estimated length of stay: 3-5 days  For review of initial/current patient goals, please see plan of care.  Attendees:  Patient:    Family:    Physician: Geoffery Lyons MD 01/01/2014 11:22 AM   Nursing: Vanessa Kick RN 01/01/2014 11:22 AM   Clinical Social Worker Monay Houlton Smart, LCSWA  01/01/2014 11:22 AM   Other: Thayer Ohm RN 01/01/2014 11:22 AM   Other:    Other:   Other:    Scribe for Treatment Team:  [employer] LCSWA 01/01/2014 11:22 AM

## 2014-01-01 NOTE — [facility] Group Notes (Signed)
[facility] LCSW Aftercare Discharge Planning Group Note   01/01/2014 10:04 AM  Participation Quality:  Pt meeting with MD during group. He plans to return home and follow up at [facility]. Pt not interested in any other services/resources and his presentation demonstrated minimal interest in discussing aftercare. Pt stated that he will get home via bus at d/c. CSW met with pt after group concluded to discuss d/c options/aftercare plan.   [place]

## 2014-01-01 NOTE — Progress Notes (Signed)
Adult Psychoeducational Group Note  Date:  01/01/2014 Time:  2:48 PM  Group Topic/Focus:  Early Warning Signs:   The focus of this group is to help patients identify signs or symptoms they exhibit before slipping into an unhealthy state or crisis.  Participation Level:  Did Not Attend  Additional Comments:  Pt was in bed at the time of group.  Lysha Schrade C Reeya Bound 01/01/2014, 2:48 PM 

## 2014-01-01 NOTE — [facility] Group Notes (Signed)
[facility] LCSW Group Therapy  01/01/2014 3:35 PM  Type of Therapy:  Group Therapy  Participation Level:  Active  Participation Quality:  Attentive  Affect:  Appropriate  Cognitive:  Alert and Oriented  Insight:  Improving  Engagement in Therapy:  Improving  Modes of Intervention:  Confrontation, Discussion, Education, Exploration, Problem-solving, Rapport Building, Socialization and Support  Summary of Progress/Problems: Feelings around Relapse. Group members discussed the meaning of relapse and shared personal stories of relapse, how it affected them and others, and how they perceived themselves during this time. Group members were encouraged to identify triggers, warning signs and coping skills used when facing the possibility of relapse. Social supports were discussed and explored in detail. Post Acute Withdrawal Syndrome (handout provided) was introduced and examined. Pt's were encouraged to ask questions, talk about key points associated with PAWS, and process this information in terms of relapse prevention. Demba was attentive and engaged throughout today's therapy group. He shared that he wants to get sober/clean for his gf and her three children to be a better example for them. Dhillon shows progress in the group setting and improving insight AEB his ability to process his feelings of shame and guilt regarding substance abuse, and acknowledge the importance of educating supports and preparing for PAWS in order to avoid future relapse.    Chaise Mahabir Smart LCSWA  01/01/2014, 3:35 PM
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
## 2014-01-01 NOTE — Progress Notes (Signed)
D) Pt lying in his bed stating that he is too tired to get up. States he really doesn't want to go any rehab treatment center, just wants to go back to [facility] for his treatment. Pt rates his depression at a 1 and his hopelessness at a 0. Denies SI and HI. States "I just want to get back on my meds and go home" A) Given support, reassurance and praise, along with encouragement to go to his groups and participate in the program. R) Denies SI and HI.

## 2014-01-01 NOTE — [facility] Counselor (Signed)
Adult Psychosocial Assessment Update Interdisciplinary Team  Previous Behavior Health Hospital admissions/discharges:  Admissions Discharges  Date: 05/15/13 Date: 05/20/13  Date: Date:  Date: Date:  Date: Date:  Date: Date:   Changes since the last Psychosocial Assessment (including adherence to outpatient mental health and/or substance abuse treatment, situational issues contributing to decompensation and/or relapse). Pt stated he has been taking klonopin x 6 months off the street. Pt denies SI/HI/AVH. Pt is pleasant and cooperative. Pt stated he was off his meds since leaving [facility] the last time. Pt stated he took his meds until he ran out, but could not afford to refill them. Pt stated his fiance did not want him around her kids in his current condition. 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 He has been living with his father, grandmother, and girlfriend and reports gf and grandmother are his only two positive supports. Pt denies SI/HI/AVH at this time.              Discharge Plan 1. Will you be returning to the same living situation after discharge?   Yes: No:      If no, what is your plan?    Pt plans to return home at discharge.        2. Would you like a referral for services when you are discharged? Yes:     If yes, for what services?  No:       Pt plans to follow up at [facility] for med management and assessment for therapy services. PT refusing referral for [facility] IOP/intpatient treatment at this time. Pt minimally interested in discussing aftercare plan with CSW.        Summary and Recommendations (to be completed by the evaluator) Pt is 28 year old male living in [place] with his grandmother, father, and girlfriend. Pt presents to [facility] for benzo, amphetamine, and marijuana abuse; depression, medication stabilization, and passive SI. Recommendations for pt include: crisis stabilization ,therapeutic milieu, encourage group attendance and  participation, medication management for mood stabilization ,and development of comprehensive mental wellness/sobriety plan.                        Signature:  [employer], LCSWA 01/01/2014 11:45 AM

## 2014-01-02 MED ORDER — QUETIAPINE FUMARATE 200 MG PO TABS
200.0000 mg | ORAL_TABLET | Freq: Every day | ORAL | Status: DC
  Administered 2014-01-02 – 2014-01-03 (×2): 200 mg via ORAL
  Filled 2014-01-02 (×4): qty 1

## 2014-01-02 MED ORDER — NICOTINE 21 MG/24HR TD PT24
MEDICATED_PATCH | TRANSDERMAL | Status: AC
  Administered 2014-01-02: 21 mg
  Filled 2014-01-02: qty 1

## 2014-01-02 MED ORDER — NICOTINE 21 MG/24HR TD PT24
21.0000 mg | MEDICATED_PATCH | Freq: Every day | TRANSDERMAL | Status: DC
  Administered 2014-01-02 – 2014-01-04 (×3): 21 mg via TRANSDERMAL
  Filled 2014-01-02 (×4): qty 1

## 2014-01-02 NOTE — Progress Notes (Signed)
D: Pt mood is depressed. He has been in bed for most of tonight and did not attend wrap up group. No complaints of withdrawal symptoms. A: Support given. Verbalization encouraged. Pt encouraged to come to staff with any concerns. Medications given as prescribed. R: Pt is receptive. No complaints of pain or discomfort at this time. Will continue to monitor pt. Q15 min safety checks maintained.

## 2014-01-02 NOTE — Progress Notes (Signed)
Psychoeducational Group Note  Date: 01/02/2014 Time:  1015  Group Topic/Focus:  Identifying Needs:   The focus of this group is to help patients identify their personal needs that have been historically problematic and identify healthy behaviors to address their needs.  Participation Level:  Did not attend Steven Barker

## 2014-01-02 NOTE — Progress Notes (Signed)
D) Pt attended some of the groups today and then went to sleep in the afternoon. States that he just wanted to withdraw due to family issues that are going on. States the reason that he is here is because he is invested in his girlfriends children and he loves them. She had made a decision to not allow him to see them due to his drug use "and that is why I am here to get myself back on the medications that will help me. A) Pt given support reassurance and praise along with encouragement. Much support given to Pt for his ability to see what he was doing wasn't healthy and for taking the steps to help himself. R) Rates his depression at a 1 and his hopelessness at a 0. Denies SI and HI.

## 2014-01-02 NOTE — Progress Notes (Signed)
Psychoeducational Group Note    Date: 01/02/2014 Time: 0930  Goal Setting Purpose of Group: To be able to set a goal that is measurable and that can be accomplished in one day Participation Level:  Not Active  Participation Quality:  Appropriate  Affect:  flat  Cognitive:  Oriented  Insight: limited Engagement in Group:  Not Engaged  Additional Comments:    Dione Housekeeper

## 2014-01-02 NOTE — [facility] Group Notes (Signed)
  [facility] LCSW Group Therapy Note  01/02/2014 /10:00 AM  Type of Therapy and Topic:  Group Therapy: Avoiding Self-Sabotaging and Enabling Behaviors  Participation Level:  Minimal ~ patient was in and out of group room at multiple points  Mood: Flat  Description of Group:     Learn how to identify obstacles, self-sabotaging and enabling behaviors, what are they, why do we do them and what needs do these behaviors meet? Discuss unhealthy relationships and how to have positive healthy boundaries with those that sabotage and enable. Explore aspects of self-sabotage and enabling in yourself and how to limit these self-destructive behaviors in everyday life.  Therapeutic Goals: 1. Patient will identify one obstacle that relates to self-sabotage and enabling behaviors 2. Patient will identify one personal self-sabotaging or enabling behavior they did prior to admission 3. Patient able to establish a plan to change the above identified behavior they did prior to admission:  4. Patient will demonstrate ability to communicate their needs through discussion and/or role plays.   Summary of Patient Progress: The main focus of today's process group was to discuss what "self-sabotage" means and use Motivational Interviewing to discuss what benefits, negative or positive, were involved in a self-identified self-sabotaging behavior. We then talked about the stages of change model and patient were given opportunity to identify where they are currently in stages of change and what self sabotaging behaviors they feel they need to be on lookout for. Steven Barker shared that he does not want to go to jail for upcoming charges and presented with flat sarcastic affect. He was in and out of group session and when asked to identify with readiness for change he shared "I'm not sure I want to change."    Therapeutic Modalities:   Cognitive Behavioral Therapy Person-Centered Therapy Motivational Interviewing   Carney Bern, LCSW

## 2014-01-02 NOTE — Progress Notes (Signed)
[name] Progress Note  01/02/2014 3:58 PM Steven Barker  MRN:  [id number] Subjective:  Still endorses feeling depressed. He states he is under a lot of stress. He is facing jail time. Going back to his house with his father, grandmother and brother is going to be very stressful. States that his brother is out of control using drugs stealing from the family and they do not do anything about it. He spoke with GF and she was upset as she is getting her father to hospice. She is very upset about it and could hardly talk as was crying Diagnosis:   DSM5: Schizophrenia Disorders:  none Obsessive-Compulsive Disorders:  none Trauma-Stressor Disorders:  none Substance/Addictive Disorders:  Cannabis Use Disorder - Moderate 9304.30), Cocaine, Amphetamine, Benzo use disorder Depressive Disorders:  Major Depressive Disorder - Moderate (296.22) Total Time spent with patient: 30 minutes  Axis I: Anxiety Disorder NOS  ADL's:  Intact  Sleep: Fair  Appetite:  Fair  Suicidal Ideation:  Plan:  denies Intent:  denies Means:  denies Homicidal Ideation:  Plan:  denies Intent:  denies Means:  denies AEB (as evidenced by):  Psychiatric Specialty Exam: Physical Exam  Review of Systems  Constitutional: Positive for malaise/fatigue.  HENT: Negative.   Eyes: Negative.   Cardiovascular: Negative.   Gastrointestinal: Negative.   Genitourinary: Negative.   Musculoskeletal: Negative.   Skin: Negative.   Neurological: Positive for weakness.  Endo/Heme/Allergies: Negative.   Psychiatric/Behavioral: Positive for depression and substance abuse. The patient is nervous/anxious.     Blood pressure 107/71, pulse 98, temperature 97.3 F (36.3 C), temperature source Oral, resp. rate 16, height 5' 6.5" (1.689 m), weight 58.514 kg (129 lb).Body mass index is 20.51 kg/(m^2).  General Appearance: Fairly Groomed  Patent attorney::  Fair  Speech:  Clear and Coherent, Slow and not spontaneous  Volume:  Decreased  Mood:   Depressed  Affect:  Restricted  Thought Process:  Coherent and Goal Directed  Orientation:  Full (Time, Place, and Person)  Thought Content:  symptoms, worries, concerns  Suicidal Thoughts:  No  Homicidal Thoughts:  No  Memory:  Immediate;   Fair Recent;   Fair Remote;   Fair  Judgement:  Fair  Insight:  Present and Shallow  Psychomotor Activity:  Decreased  Concentration:  Fair  Recall:  [employer] of Knowledge:NA  Language: Fair  Akathisia:  No  Handed:    AIMS (if indicated):     Assets:  Desire for Improvement  Sleep:  Number of Hours: 6   Musculoskeletal: Strength & Muscle Tone: within normal limits Gait & Station: normal Patient leans: N/A  Current Medications: Current Facility-Administered Medications  Medication Dose Route Frequency Provider Last Rate Last Dose  . alum & mag hydroxide-simeth (MAALOX/MYLANTA) 200-200-20 MG/5ML suspension 30 mL  30 mL Oral Q4H PRN Kristeen Mans, NP      . clonazePAM Scarlette Calico) tablet 0.5 mg  0.5 mg Oral BID PRN Rachael Fee, MD   0.5 mg at 01/02/14 1013  . feeding supplement (ENSURE COMPLETE) (ENSURE COMPLETE) liquid 237 mL  237 mL Oral BID BM Rachael Fee, MD   237 mL at 01/02/14 0881  . hydrOXYzine (ATARAX/VISTARIL) tablet 25 mg  25 mg Oral Q6H PRN Kristeen Mans, NP      . magnesium hydroxide (MILK OF MAGNESIA) suspension 30 mL  30 mL Oral Daily PRN Kristeen Mans, NP      . nicotine (NICODERM CQ - dosed in mg/24 hours) patch 21  mg  21 mg Transdermal Daily Rachael Fee, MD   21 mg at 01/02/14 1015  . QUEtiapine (SEROQUEL) tablet 200 mg  200 mg Oral QHS Rachael Fee, MD      . QUEtiapine (SEROQUEL) tablet 50 mg  50 mg Oral BID Rachael Fee, MD   50 mg at 01/02/14 6811    Lab Results:  Results for orders placed during the hospital encounter of 12/31/13 (from the past 48 hour(s))  URINE RAPID DRUG SCREEN (HOSP PERFORMED)     Status: Abnormal   Collection Time    12/31/13  4:08 PM      Result Value Ref Range   Opiates NONE  DETECTED  NONE DETECTED   Cocaine POSITIVE (*) NONE DETECTED   Benzodiazepines POSITIVE (*) NONE DETECTED   Amphetamines POSITIVE (*) NONE DETECTED   Tetrahydrocannabinol POSITIVE (*) NONE DETECTED   Barbiturates NONE DETECTED  NONE DETECTED   Comment:            DRUG SCREEN FOR MEDICAL PURPOSES     ONLY.  IF CONFIRMATION IS NEEDED     FOR ANY PURPOSE, NOTIFY LAB     WITHIN 5 DAYS.                LOWEST DETECTABLE LIMITS     FOR URINE DRUG SCREEN     Drug Class       Cutoff (ng/mL)     Amphetamine      1000     Barbiturate      200     Benzodiazepine   200     Tricyclics       300     Opiates          300     Cocaine          300     THC              50    Physical Findings: AIMS: Facial and Oral Movements Muscles of Facial Expression: None, normal Lips and Perioral Area: None, normal Jaw: None, normal Tongue: None, normal,Extremity Movements Upper (arms, wrists, hands, fingers): None, normal Lower (legs, knees, ankles, toes): None, normal, Trunk Movements Neck, shoulders, hips: None, normal, Overall Severity Severity of abnormal movements (highest score from questions above): None, normal Incapacitation due to abnormal movements: None, normal Patient's awareness of abnormal movements (rate only patient's report): No Awareness, Dental Status Current problems with teeth and/or dentures?: No Does patient usually wear dentures?: No  CIWA:  CIWA-Ar Total: 0 COWS:  COWS Total Score: 1  Treatment Plan Summary: Daily contact with patient to assess and evaluate symptoms and progress in treatment Medication management  Plan: Supportive approach/coping skills/relapse prevention           Address detox needs           Optimize treatment with psychotropics  Medical Decision Making Problem Points:  Review of psycho-social stressors (1) Data Points:  Review of medication regiment & side effects (2)  I certify that inpatient services furnished can reasonably be expected to  improve the patient's condition.   Rachael Fee 01/02/2014, 3:58 PM

## 2014-01-03 DIAGNOSIS — F411 Generalized anxiety disorder: Secondary | ICD-10-CM

## 2014-01-03 NOTE — Progress Notes (Signed)
Psychoeducational Group Note  Date: 01/03/2014 Time:0900 Group Topic/Focus:  Gratefulness:  The focus of this group is to help patients identify what two things they are most grateful for in their lives. What helps ground them and to center them on their work to their recovery.  Participation Level:  Active  Participation Quality:  Appropriate  Affect:  Appropriate  Cognitive:  Oriented  Insight:  Improving  Engagement in Group:  Engaged  Additional Comments:    Makyla Bye A   

## 2014-01-03 NOTE — Progress Notes (Signed)
D) Pt upset this afternoon and asking for his Seroquel early. Pt. Angry and cursing when told that medication could not be given due to the fact it was scheduled. Pt left the medication room upset before he could be told that he had a prn that could be given. When finally coming back to the medication window Pt was able to verbalize the fact that his fiance' took his food stamps and is not answering her phone. Pt feels that "she wnet back to her husband and took my food stamp card with her".  A) Provided with a 1:1. Gentle yet firm limits set for Pt. Given support, reassurance and praise. Talked with Pt about anger being a second feeling. R) Pt was able to talk about feeling hurt and fearful that he is going to lose his fiance'.

## 2014-01-03 NOTE — Progress Notes (Signed)
Patient ID: Steven Barker, male   DOB: 04-10-1986, 28 y.o.   MRN: [id number] [facility] MD Progress Note  01/03/2014 1:00 PM Steven Barker  MRN:  [id number] Subjective:  Pt seen and chart reviewed. Pt denies SI, HI, and AVH, contracts for safety. Pt reports great improvement, minimizing anxiety and depression symptoms as being minimal. Pt is participating in group therapy and taking medications as prescribed. Pt anticipates discharge within the next couple days. Pt reports that he is intermittently anxious about having trouble reaching his girlfriend but reports that she is in hospice with her father.   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  HENT: Negative.   Eyes: Negative.   Cardiovascular: Negative.   Gastrointestinal: Negative.   Genitourinary: Negative.   Musculoskeletal: Negative.   Skin: Negative.   Neurological: Positive for weakness.  Endo/Heme/Allergies: Negative.   Psychiatric/Behavioral: Positive for depression and substance abuse. The patient is nervous/anxious.     Blood pressure 108/63, pulse 71, temperature 97.7 F (36.5 C), temperature source Oral, resp. rate 17, height 5' 6.5" (1.689 m), weight 58.514 kg (129 lb).Body mass index is 20.51 kg/(m^2).  General Appearance: Fairly Groomed  Patent attorney::  Fair  Speech:   Clear and Coherent, Slow and not spontaneous  Volume:  Decreased  Mood:  Depressed  Affect:  Restricted  Thought Process:  Coherent and Goal Directed  Orientation:  Full (Time, Place, and Person)  Thought Content:  symptoms, worries, concerns  Suicidal Thoughts:  No  Homicidal Thoughts:  No  Memory:  Immediate;   Fair Recent;   Fair Remote;   Fair  Judgement:  Fair  Insight:  Present and Shallow  Psychomotor Activity:  Decreased  Concentration:  Fair  Recall:  [employer] of Knowledge:NA  Language: Fair  Akathisia:  No  Handed:    AIMS (if indicated):     Assets:  Desire for Improvement  Sleep:  Number of Hours: 6.75   Musculoskeletal: Strength & Muscle Tone: within normal limits Gait & Station: normal Patient leans: N/A  Current Medications: Current Facility-Administered Medications  Medication Dose Route Frequency Provider Last Rate Last Dose  . alum & mag hydroxide-simeth (MAALOX/MYLANTA) 200-200-20 MG/5ML suspension 30 mL  30 mL Oral Q4H PRN Kristeen Mans, NP      . clonazePAM Scarlette Calico) tablet 0.5 mg  0.5 mg Oral BID PRN Rachael Fee, MD   0.5 mg at 01/03/14 1112  . feeding supplement (ENSURE COMPLETE) (ENSURE COMPLETE) liquid 237 mL  237 mL Oral BID BM Rachael Fee, MD   237 mL at 01/03/14 1113  . hydrOXYzine (ATARAX/VISTARIL) tablet 25 mg  25 mg Oral Q6H PRN Kristeen Mans, NP      . magnesium hydroxide (MILK OF MAGNESIA) suspension 30 mL  30 mL Oral Daily PRN Kristeen Mans, NP      . nicotine (NICODERM CQ - dosed in mg/24 hours) patch 21 mg  21 mg Transdermal Daily Rachael Fee, MD   21 mg at 01/03/14 0900  . QUEtiapine (SEROQUEL) tablet 200 mg  200 mg Oral QHS Rachael Fee, MD   200 mg at 01/02/14 2132  . QUEtiapine (SEROQUEL) tablet 50 mg  50 mg Oral BID Rachael Fee, MD   50 mg at 01/03/14 2119    Lab Results:  No results found for this or any previous visit (from the past 48 hour(s)).  Physical Findings: AIMS: Facial and Oral Movements Muscles of Facial  Expression: None, normal Lips and Perioral Area: None, normal Jaw: None, normal Tongue: None, normal,Extremity Movements Upper (arms, wrists, hands, fingers): None, normal Lower (legs, knees, ankles, toes): None, normal, Trunk Movements Neck, shoulders, hips: None, normal, Overall Severity Severity of abnormal movements (highest score from questions above): None, normal Incapacitation due to abnormal movements: None, normal Patient's awareness of abnormal movements (rate only patient's report): No Awareness, Dental Status Current problems with teeth and/or dentures?: No Does patient usually wear dentures?: No  CIWA:  CIWA-Ar Total: 0 COWS:  COWS Total Score: 1  Treatment Plan Summary: Daily contact with patient to assess and evaluate symptoms and progress in treatment Medication management  Plan: Supportive approach/coping skills/relapse prevention           Address detox needs           Optimize treatment with psychotropics  Medical Decision Making Problem Points:  Review of psycho-social stressors (1) Data Points:  Review of medication regiment & side effects (2)  I certify that inpatient services furnished can reasonably be expected to improve the patient's condition.   Beau Fanny, FNP-BC 01/03/2014, 1:00 PM  Reviewed the information documented and agree with the treatment plan.  Randal Buba Kail Fraley 01/03/2014 4:04 PM

## 2014-01-03 NOTE — Progress Notes (Signed)
Patient did not attend the evening speaker AA meeting. Pt remained sleeping in his bed during group.

## 2014-01-03 NOTE — [facility] Group Notes (Signed)
[facility] LCSW Group Therapy Note   01/03/2014 at 10:00 AM  Type of Therapy and Topic: Group Therapy: Feelings Around Returning Home & Establishing a Supportive Framework and Activity to Identify signs of Improvement or Decompensation   Participation Level:  Didn Not Attend  Carney Bern, LCSW

## 2014-01-04 DIAGNOSIS — F332 Major depressive disorder, recurrent severe without psychotic features: Principal | ICD-10-CM

## 2014-01-04 DIAGNOSIS — F1920 Other psychoactive substance dependence, uncomplicated: Secondary | ICD-10-CM

## 2014-01-04 MED ORDER — QUETIAPINE FUMARATE 50 MG PO TABS
ORAL_TABLET | ORAL | Status: DC
Start: 2014-01-04 — End: 2014-04-12

## 2014-01-04 MED ORDER — HYDROXYZINE HCL 25 MG PO TABS
25.0000 mg | ORAL_TABLET | Freq: Three times a day (TID) | ORAL | Status: DC | PRN
  Filled 2014-01-04: qty 42

## 2014-01-04 MED ORDER — HYDROXYZINE HCL 25 MG PO TABS: ORAL_TABLET | ORAL | Status: DC

## 2014-01-04 MED ORDER — QUETIAPINE FUMARATE 50 MG PO TABS
ORAL_TABLET | ORAL | Status: DC
Start: 2014-01-04 — End: 2014-01-04

## 2014-01-04 NOTE — [facility] Group Notes (Signed)
[facility] LCSW Aftercare Discharge Planning Group Note   01/04/2014 8:45 AM  Participation Quality:  Alert, Appropriate and Oriented  Mood/Affect:  Calm  Depression Rating: 1    Anxiety Rating:  1  Thoughts of Suicide:  Pt denies SI/HI  Will you contract for safety?   Yes  Current AVH:  Pt denies  Plan for Discharge/Comments:  Pt attended discharge planning group and actively participated in group.  CSW provided pt with today's workbook.  Pt reports feeling well today and hoping to d/c soon.  Pt will return home in [place] and has follow up scheduled at [facility] for outpatient medication management and therapy.  No further needs voiced by pt at this time.    Transportation Means: Pt reports access to transportation - bus pass provided  Supports: No supports mentioned at this time  Reyes Ivan, LCSW 01/04/2014 9:55 AM

## 2014-01-04 NOTE — Progress Notes (Signed)
Adult Psychoeducational Group Note  Date:  01/04/2014 Time:  12:03 PM  Group Topic/Focus:  Self Care:   The focus of this group is to help patients understand the importance of self-care in order to improve or restore emotional, physical, spiritual, interpersonal, and financial health.  Participation Level:  Active  Participation Quality:  Appropriate  Affect:  Appropriate and Flat  Cognitive:  Appropriate  Insight: Appropriate  Engagement in Group:  Engaged and Lacking  Modes of Intervention:  Discussion, Socialization and Support  Additional Comments:  Pts discussed what self-care means to them and areas in their personal lives where they are lacking in self care and things they could do to improve their self-care. Pt stated he needed to eat better. Writer asked what he could do when he left the hospital to help with this, pt stated "smoke a cigarette." Writer encouraged pt to think about what he could do in regards to his self-care. Pt stated he needed to get his food stamp card and that he needs better transportation because the grocery store is 5 miles from his house and he has to carry all of his groceries.  Steven Barker 01/04/2014, 12:03 PM

## 2014-01-04 NOTE — Progress Notes (Signed)
Patient ID: Steven Barker, male   DOB: [birth year], 28 y.o.   MRN: [id number] D. The patient spent the evening resting in bed with his eyes closed. He got up only to get his medication and went back to bed. Stated that he had a lot on his mind.  A. Attempted to awaken patient to attend evening AA group. Administered HS medication. R. The patient did not attend evening AA group. Compliant with medication.

## 2014-01-04 NOTE — [facility] Suicide Risk Assessment (Signed)
[facility] INPATIENT:  Family/Significant Other Suicide Prevention Education  Suicide Prevention Education:  Education Completed; Dessie Coma - grandmother [phone]),  (name of family member/significant other) has been identified by the patient as the family member/significant other with whom the patient will be residing, and identified as the person(s) who will aid the patient in the event of a mental health crisis (suicidal ideations/suicide attempt).  With written consent from the patient, the family member/significant other has been provided the following suicide prevention education, prior to the and/or following the discharge of the patient.  The suicide prevention education provided includes the following:  Suicide risk factors  Suicide prevention and interventions  National Suicide Hotline telephone number  [facility] assessment telephone number  [facility] Emergency Assistance 911  [facility] and/or Residential Mobile Crisis Unit telephone number  Request made of family/significant other to:  Remove weapons (e.g., guns, rifles, knives), all items previously/currently identified as safety concern.    Remove drugs/medications (over-the-counter, prescriptions, illicit drugs), all items previously/currently identified as a safety concern.  The family member/significant other verbalizes understanding of the suicide prevention education information provided.  The family member/significant other agrees to remove the items of safety concern listed above.  Talita Recht N Horton 01/04/2014, 10:56 AM

## 2014-01-04 NOTE — Progress Notes (Signed)
[facility] Adult Case Management Discharge Plan :  Will you be returning to the same living situation after discharge: Yes,  returning home At discharge, do you have transportation home?:Yes,  provided pt with a bus pass.  Pt states that the issue when he leaves the hospital is transportation, to [place].  CSW provided pt extra bus passes to get [facility] for hospital discharge appointment.  CSW explained pt can let [facility] know the issue, as they have transitional care team to assist or Social Services for assistance.  Pt verbalizes an understanding and plan to do this.   Do you have the ability to pay for your medications:Yes,  provided pt with samples and prescriptions and pt referred to [facility] for assistance with affording meds  Release of information consent forms completed and in the chart;  Patient's signature needed at discharge.  Patient to Follow up at: Follow-up Information   Follow up with [facility] On 01/06/2014. (Walk in between 8am-9am Monday through Friday for hospital follow-up/medication management/assessment for therapy services. )    Contact information:   [address], [place] [address] Phone: [phone] Fax: [phone]      Patient denies SI/HI:   Yes,  denies SI/HI    Safety Planning and Suicide Prevention discussed:  Yes,  discussed with pt and pt's grandmother.  See suicide prevention education note.   Keegen Heffern N Horton 01/04/2014, 10:28 AM

## 2014-01-04 NOTE — [facility] Suicide Risk Assessment (Signed)
Suicide Risk Assessment  Discharge Assessment     Demographic Factors:  Male and Caucasian  Total Time spent with patient: 45 minutes  Psychiatric Specialty Exam:     Blood pressure 108/63, pulse 71, temperature 97.7 F (36.5 C), temperature source Oral, resp. rate 17, height 5' 6.5" (1.689 m), weight 58.514 kg (129 lb).Body mass index is 20.51 kg/(m^2).  General Appearance: Fairly Groomed  Patent attorney::  Fair  Speech:  Clear and Coherent  Volume:  Normal  Mood:  Euthymic  Affect:  Appropriate  Thought Process:  Coherent and Goal Directed  Orientation:  Full (Time, Place, and Person)  Thought Content:  plans as he moves on, relapse prevention plan  Suicidal Thoughts:  No  Homicidal Thoughts:  No  Memory:  Immediate;   Fair Recent;   Fair Remote;   Fair  Judgement:  Fair  Insight:  Present  Psychomotor Activity:  EPS  Concentration:  Fair  Recall:  [employer] of Knowledge:NA  Language: Fair  Akathisia:  No  Handed:    AIMS (if indicated):     Assets:  Desire for Improvement  Sleep:  Number of Hours: 6.25    Musculoskeletal: Strength & Muscle Tone: within normal limits Gait & Station: normal Patient leans: N/A   Mental Status Per Nursing Assessment::   On Admission:     Current Mental Status by Physician: In full contact with reality. No S/S of withdrawal. No SI, plans or intent. He feels ready to be D/C. His father is back in a psychiatric unit, he feels he needs to be there to take care of his grandmother.    Loss Factors: NA  Historical Factors: NA  Risk Reduction Factors:   Living with another person, especially a relative  Continued Clinical Symptoms:  Depression:   Comorbid alcohol abuse/dependence Alcohol/Substance Abuse/Dependencies  Cognitive Features That Contribute To Risk:  Closed-mindedness Polarized thinking Thought constriction (tunnel vision)    Suicide Risk:  Minimal: No identifiable suicidal ideation.  Patients presenting with  no risk factors but with morbid ruminations; may be classified as minimal risk based on the severity of the depressive symptoms  Discharge Diagnoses:   AXIS I:  Polysubstance Abuse, MDD, Anxiety Disorder NOS AXIS II:  No diagnosis AXIS III:   Past Medical History  Diagnosis Date  . Anxiety   . Depression   . Rheumatoid arthritis(714.0)    AXIS IV:  other psychosocial or environmental problems AXIS V:  61-70 mild symptoms  Plan Of Care/Follow-up recommendations:  Activity:  as tolerated Diet:  regular Follow up [facility] Is patient on multiple antipsychotic therapies at discharge:  No   Has Patient had three or more failed trials of antipsychotic monotherapy by history:  No  Recommended Plan for Multiple Antipsychotic Therapies: NA    Rachael Fee 01/04/2014, 12:36 PM

## 2014-01-04 NOTE — Tx Team (Signed)
Interdisciplinary Treatment Plan Update (Adult)  Date: 01/04/2014  Time Reviewed:  9:45 AM  Progress in Treatment: Attending groups: Yes Participating in groups:  Yes Taking medication as prescribed:  Yes Tolerating medication:  Yes Family/Significant othe contact made: Yes, with Steven Barker's grandmother Patient understands diagnosis:  Yes Discussing patient identified problems/goals with staff:  Yes Medical problems stabilized or resolved:  Yes Denies suicidal/homicidal ideation: Yes Issues/concerns per patient self-inventory:  Yes Other:  New problem(s) identified: N/A  Discharge Plan or Barriers: Steven Barker will follow up at [facility] for outpatient medication management and therapy.    Reason for Continuation of Hospitalization: Stable to d/c today  Comments: N/A  Estimated length of stay: D/C today  For review of initial/current patient goals, please see plan of care.  Attendees: Patient:     Family:     Physician:  Dr. Dub Mikes 01/04/2014 10:26 AM   Nursing:   Roswell Miners, RN 01/04/2014 10:26 AM   Clinical Social Worker:  Reyes Ivan, LCSW 01/04/2014 10:26 AM   Other: Onnie Boer, RN case manager 01/04/2014 10:26 AM   Other:  Trula Slade, LCSWA 01/04/2014 10:26 AM   Other:  Serena Colonel, NP 01/04/2014 10:26 AM   Other:  Joslyn Devon, RN 01/04/2014 10:26 AM   Other:    Other:    Other:    Other:    Other:    Other:     Scribe for Treatment Team:   Carmina Miller, 01/04/2014 , 10:26 AM

## 2014-01-04 NOTE — Progress Notes (Signed)
Patient ID: Steven Barker, male   DOB: 1985/10/05, 28 y.o.   MRN: [id number] Nursing discharge note:  Patient discharged home per MD order.  Patient received all personal belongings, prescriptions and medication samples.  Discharge instructions reviewed along with medications.  Patient indicated understanding.  Patient with follow up with [facility] for medication management.  He denies SI/HI/AVH.  Patient left ambulatory for the bus depot.
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
## 2014-01-06 NOTE — Progress Notes (Signed)
Patient Discharge Instructions:  After Visit Summary (AVS):   Faxed to:  01/06/14 Discharge Summary Note:   Faxed to:  01/06/14 Psychiatric Admission Assessment Note:   Faxed to:  01/06/14 Suicide Risk Assessment - Discharge Assessment:   Faxed to:  01/06/14 Faxed/Sent to the Next Level Care provider:  01/06/14 Faxed to [facility] @ [phone]  Jerelene Redden, 01/06/2014, 4:03 PM

## 2014-04-12 ENCOUNTER — Emergency Department (HOSPITAL_COMMUNITY): Payer: Self-pay

## 2014-04-12 ENCOUNTER — Emergency Department (HOSPITAL_COMMUNITY)
Admission: EM | Admit: 2014-04-12 | Discharge: 2014-04-12 | Disposition: A | Discharge status: Home / Self Care | Payer: Self-pay | Attending: Emergency Medicine | Admitting: Emergency Medicine

## 2014-04-12 ENCOUNTER — Encounter (HOSPITAL_COMMUNITY): Payer: Self-pay | Admitting: Emergency Medicine

## 2014-04-12 DIAGNOSIS — J029 Acute pharyngitis, unspecified: Secondary | ICD-10-CM | POA: Insufficient documentation

## 2014-04-12 DIAGNOSIS — Z8739 Personal history of other diseases of the musculoskeletal system and connective tissue: Secondary | ICD-10-CM | POA: Insufficient documentation

## 2014-04-12 DIAGNOSIS — F17200 Nicotine dependence, unspecified, uncomplicated: Secondary | ICD-10-CM | POA: Insufficient documentation

## 2014-04-12 DIAGNOSIS — R1012 Left upper quadrant pain: Secondary | ICD-10-CM | POA: Insufficient documentation

## 2014-04-12 DIAGNOSIS — Z8659 Personal history of other mental and behavioral disorders: Secondary | ICD-10-CM | POA: Insufficient documentation

## 2014-04-12 DIAGNOSIS — K089 Disorder of teeth and supporting structures, unspecified: Secondary | ICD-10-CM | POA: Insufficient documentation

## 2014-04-12 LAB — URINALYSIS, ROUTINE W REFLEX MICROSCOPIC
Bilirubin Urine: NEGATIVE
Glucose, UA: NEGATIVE mg/dL
Hgb urine dipstick: NEGATIVE
Ketones, ur: NEGATIVE mg/dL
LEUKOCYTES UA: NEGATIVE
NITRITE: NEGATIVE
PROTEIN: NEGATIVE mg/dL
Specific Gravity, Urine: 1.011 (ref 1.005–1.030)
UROBILINOGEN UA: 0.2 mg/dL (ref 0.0–1.0)
pH: 6 (ref 5.0–8.0)

## 2014-04-12 LAB — CBC
HEMATOCRIT: 42.8 % (ref 39.0–52.0)
Hemoglobin: 14.6 g/dL (ref 13.0–17.0)
MCH: 31 pg (ref 26.0–34.0)
MCHC: 34.1 g/dL (ref 30.0–36.0)
MCV: 90.9 fL (ref 78.0–100.0)
PLATELETS: 220 10*3/uL (ref 150–400)
RBC: 4.71 MIL/uL (ref 4.22–5.81)
RDW: 13 % (ref 11.5–15.5)
WBC: 18 10*3/uL — ABNORMAL HIGH (ref 4.0–10.5)

## 2014-04-12 LAB — COMPREHENSIVE METABOLIC PANEL
ALBUMIN: 3.5 g/dL (ref 3.5–5.2)
ALT: 6 U/L (ref 0–53)
ANION GAP: 13 (ref 5–15)
AST: 12 U/L (ref 0–37)
Alkaline Phosphatase: 81 U/L (ref 39–117)
BUN: 8 mg/dL (ref 6–23)
CALCIUM: 9.2 mg/dL (ref 8.4–10.5)
CO2: 23 mEq/L (ref 19–32)
CREATININE: 1.14 mg/dL (ref 0.50–1.35)
Chloride: 98 mEq/L (ref 96–112)
GFR calc Af Amer: >90 mL/min (ref >90)
GFR calc non Af Amer: 88 mL/min — ABNORMAL LOW (ref >90)
Glucose, Bld: 102 mg/dL — ABNORMAL HIGH (ref 70–99)
Potassium: 3.6 mEq/L — ABNORMAL LOW (ref 3.7–5.3)
Sodium: 134 mEq/L — ABNORMAL LOW (ref 137–147)
TOTAL PROTEIN: 7.1 g/dL (ref 6.0–8.3)
Total Bilirubin: 0.3 mg/dL (ref 0.3–1.2)

## 2014-04-12 LAB — MONONUCLEOSIS SCREEN: MONO SCREEN: NEGATIVE

## 2014-04-12 LAB — LIPASE, BLOOD: LIPASE: 13 U/L (ref 11–59)

## 2014-04-12 LAB — RAPID STREP SCREEN (MED CTR MEBANE ONLY): STREPTOCOCCUS, GROUP A SCREEN (DIRECT): NEGATIVE

## 2014-04-12 MED ORDER — ONDANSETRON HCL 4 MG/2ML IJ SOLN
4.0000 mg | Freq: Once | INTRAMUSCULAR | Status: AC
  Administered 2014-04-12: 4 mg via INTRAVENOUS
  Filled 2014-04-12: qty 2

## 2014-04-12 MED ORDER — IOHEXOL 300 MG/ML  SOLN
80.0000 mL | Freq: Once | INTRAMUSCULAR | Status: AC | PRN
  Administered 2014-04-12: 80 mL via INTRAVENOUS

## 2014-04-12 MED ORDER — HYDROCODONE-ACETAMINOPHEN 5-325 MG PO TABS: 1.0000 | ORAL_TABLET | Freq: Four times a day (QID) | ORAL | Status: DC | PRN

## 2014-04-12 MED ORDER — DEXAMETHASONE 6 MG PO TABS
12.0000 mg | ORAL_TABLET | Freq: Once | ORAL | Status: AC
  Administered 2014-04-12: 12 mg via ORAL
  Filled 2014-04-12: qty 2

## 2014-04-12 MED ORDER — IOHEXOL 300 MG/ML  SOLN
50.0000 mL | Freq: Once | INTRAMUSCULAR | Status: AC | PRN
  Administered 2014-04-12: 50 mL via ORAL

## 2014-04-12 MED ORDER — MORPHINE SULFATE 4 MG/ML IJ SOLN
6.0000 mg | Freq: Once | INTRAMUSCULAR | Status: AC
  Administered 2014-04-12: 6 mg via INTRAVENOUS
  Filled 2014-04-12: qty 2

## 2014-04-12 MED ORDER — SODIUM CHLORIDE 0.9 % IV BOLUS (SEPSIS)
1000.0000 mL | Freq: Once | INTRAVENOUS | Status: AC
  Administered 2014-04-12: 1000 mL via INTRAVENOUS

## 2014-04-12 NOTE — ED Notes (Signed)
Bed: [facility] Expected date:  Expected time:  Means of arrival:  Comments: EMS/28 yo male with headache  And migraine/fever 100.2

## 2014-04-12 NOTE — Discharge Instructions (Signed)
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
## 2014-04-12 NOTE — ED Provider Notes (Signed)
CSN: [id number]     Arrival date & time 04/12/14  2004 History   First MD Initiated Contact with Patient 04/12/14 2036     Chief Complaint  Patient presents with  . Abdominal Pain  . Dental Pain     (Consider location/radiation/quality/duration/timing/severity/associated sxs/prior Treatment) Patient is a 28 y.o. male presenting with abdominal pain and tooth pain. The history is provided by the patient.  Abdominal Pain Pain location:  LUQ Pain quality: aching   Pain radiates to:  Does not radiate Pain severity:  Moderate Onset quality:  Gradual Duration:  6 days Timing:  Constant Progression:  Unchanged Chronicity:  New Context: not eating, not previous surgeries and not recent illness   Relieved by:  Nothing Worsened by:  Nothing tried Associated symptoms: fever and sore throat   Associated symptoms: no chest pain, no chills, no cough, no diarrhea, no fatigue, no shortness of breath and no vomiting   Dental Pain Associated symptoms: fever     Past Medical History  Diagnosis Date  . Anxiety   . Depression   . Rheumatoid arthritis(714.0)    History reviewed. No pertinent past surgical history. No family history on file. History  Substance Use Topics  . Smoking status: Current Every Day Smoker -- 1.00 packs/day for 10 years    Types: Cigarettes  . Smokeless tobacco: Not on file  . Alcohol Use: Yes    Review of Systems  Constitutional: Positive for fever. Negative for chills and fatigue.  HENT: Positive for dental problem (L upper maxillary tooth pain) and sore throat.   Respiratory: Negative for cough and shortness of breath.   Cardiovascular: Negative for chest pain and leg swelling.  Gastrointestinal: Positive for abdominal pain (LUQ). Negative for vomiting and diarrhea.  All other systems reviewed and are negative.     Allergies  Clindamycin/lincomycin  Home Medications   Prior to Admission medications   Medication Sig Start Date End Date Taking?  Authorizing Provider  albuterol (PROVENTIL HFA;VENTOLIN HFA) 108 (90 BASE) MCG/ACT inhaler Inhale 1-2 puffs into the lungs every 6 (six) hours as needed for wheezing or shortness of breath.   Yes Historical Provider, MD  traMADol (ULTRAM) 50 MG tablet Take 50 mg by mouth every 6 (six) hours as needed for moderate pain.   Yes Historical Provider, MD  HYDROcodone-acetaminophen (NORCO/VICODIN) 5-325 MG per tablet Take 1 tablet by mouth every 6 (six) hours as needed for moderate pain. 04/12/14   Elwin Mocha, MD   BP 108/59  Pulse 78  Temp(Src) 98.2 F (36.8 C) (Oral)  Resp 18  SpO2 100% Physical Exam  Constitutional: He is oriented to person, place, and time. He appears well-developed and well-nourished. No distress.  HENT:  Head: Normocephalic and atraumatic.  Mouth/Throat: Oropharyngeal exudate (white exudate on tonsils) and posterior oropharyngeal erythema present. No posterior oropharyngeal edema or tonsillar abscesses.    No intra oral abscess identified  Eyes: EOM are normal. Pupils are equal, round, and reactive to light.  Neck: Normal range of motion. Neck supple.  Cardiovascular: Normal rate and regular rhythm.  Exam reveals no friction rub.   No murmur heard. Pulmonary/Chest: Effort normal and breath sounds normal. No respiratory distress. He has no wheezes. He has no rales.  Abdominal: He exhibits no distension. There is no hepatosplenomegaly, splenomegaly or hepatomegaly. There is tenderness in the left upper quadrant. There is no rebound. No hernia. Hernia confirmed negative in the ventral area.  Musculoskeletal: Normal range of motion. He exhibits no edema.  Neurological: He is alert and oriented to person, place, and time.  Skin: He is not diaphoretic.    ED Course  Procedures (including critical care time) Labs Review Labs Reviewed  CBC - Abnormal; Notable for the following:    WBC 18.0 (*)    All other components within normal limits  COMPREHENSIVE METABOLIC PANEL -  Abnormal; Notable for the following:    Sodium 134 (*)    Potassium 3.6 (*)    Glucose, Bld 102 (*)    GFR calc non Af Amer 88 (*)    All other components within normal limits  URINALYSIS, ROUTINE W REFLEX MICROSCOPIC - Abnormal; Notable for the following:    APPearance CLOUDY (*)    All other components within normal limits  RAPID STREP SCREEN  CULTURE, GROUP A STREP  LIPASE, BLOOD  MONONUCLEOSIS SCREEN    Imaging Review Ct Abdomen Pelvis W Contrast  04/12/2014   CLINICAL DATA:  Abdominal pain, left upper quadrant.  Dental pain.  EXAM: CT ABDOMEN AND PELVIS WITH CONTRAST  TECHNIQUE: Multidetector CT imaging of the abdomen and pelvis was performed using the standard protocol following bolus administration of intravenous contrast.  CONTRAST:  57mL OMNIPAQUE IOHEXOL 300 MG/ML SOLN, 76mL OMNIPAQUE IOHEXOL 300 MG/ML SOLN  COMPARISON:  None.  FINDINGS: BODY WALL: Unremarkable.  LOWER CHEST: Unremarkable.  ABDOMEN/PELVIS:  Liver: No focal abnormality.  Biliary: No evidence of biliary obstruction or stone.  Pancreas: Unremarkable.  Spleen: Unremarkable.  Adrenals: Unremarkable.  Kidneys and ureters: No hydronephrosis or stone.  Bladder: Unremarkable.  Reproductive: Unremarkable.  Bowel: No obstruction or inflammation. Stool volume is moderate. Negative appendix.  Retroperitoneum: No mass or adenopathy.  Peritoneum: No ascites or pneumoperitoneum.  Vascular: No acute abnormality.  OSSEOUS: No acute abnormalities. Hypertrophic healing of an anterior right sixth rib fracture. No osseous findings to explain left-sided pain.  IMPRESSION: No findings to explain left upper quadrant pain.   Electronically Signed   By: Tiburcio Pea M.D.   On: 04/12/2014 22:34     EKG Interpretation None      MDM   Final diagnoses:  Pharyngitis    28 year old male here with multiple complaints. 6 days and abdominal pain. Left upper quadrant. Does not radiate, described as sharp. Has started having pharyngitis  symptoms for the past 3 days. No hepatosplenomegaly. Only focal left upper quadrant pain. CT scan normal. Throat pain: 3 days, fever, lymphadenopathy, difficulty swallowing. On exam has midline uvula was swollen tonsils with white and red exudates. Negative strep. Multiple submandibular lymph nodes are tender. Neck is supple without nuchal rigidity. Given Decadron for pharyngitis.  Stable for discharge. Given followup with his regular doctor.    Elwin Mocha, MD 04/12/14 [phone]

## 2014-04-13 LAB — CULTURE, GROUP A STREP

## 2014-04-14 ENCOUNTER — Telehealth (HOSPITAL_BASED_OUTPATIENT_CLINIC_OR_DEPARTMENT_OTHER): Payer: Self-pay

## 2014-04-14 NOTE — Telephone Encounter (Signed)
Post ED Visit - Positive Culture Follow-up: Successful Patient Follow-Up  Culture assessed and recommendations reviewed by: []  Wes Dulaney, Pharm.D., BCPS []  , Pharm.D., BCPS []  , Pharm.D., BCPS []  [place], .D., BCPS, AAHIVP []  Georgina Pillion, Pharm.D., BCPS, AAHIVP []  , Pharm.D. [x]  [place], Pharm.D.  Positive Throat culture, Beta Hemolytic Non Group A Strep.  []  Patient discharged without antimicrobial prescription and treatment is now indicated []  Organism is resistant to prescribed ED discharge antimicrobial []  Patient with positive blood cultures  Changes discussed with ED provider: N. Pisciotta PA New antibiotic prescription Amoxicillin 500 mg po BID x 7 days Called to  14 "Symptom Check: if better, no tx, if still feels bad give Amoxicillin..." Contacted patient, date 8/19, time    04/14/2014, 1:01 PM

## 2014-04-14 NOTE — Progress Notes (Signed)
ED Antimicrobial Stewardship Positive Culture Follow Up   Steven Barker is an 28 y.o. male who presented to [facility] on 04/12/2014 with a chief complaint of  Chief Complaint  Patient presents with  . Abdominal Pain  . Dental Pain    Recent Results (from the past 720 hour(s))  RAPID STREP SCREEN     Status: None   Collection Time    04/12/14  9:43 PM      Result Value Ref Range Status   Streptococcus, Group A Screen (Direct) NEGATIVE  NEGATIVE Final   Comment: (NOTE)     A Rapid Antigen test may result negative if the antigen level in the     sample is below the detection level of this test. The FDA has not     cleared this test as a stand-alone test therefore the rapid antigen     negative result has reflexed to a Group A Strep culture.  CULTURE, GROUP A STREP     Status: None   Collection Time    04/12/14  9:43 PM      Result Value Ref Range Status   Specimen Description THROAT   Final   Special Requests NONE   Final   Culture     Final   Value: STREPTOCOCCUS,BETA HEMOLYTIC NOT GROUP A     Performed at [facility]   Report Status 04/13/2014 FINAL   Final   [x]  Patient discharged originally without antimicrobial agent and treatment is now indicated  Will call the patient and do a symptom check.  If patient is currently having no symptoms, no treatment will be needed. If patient is still having flank pain, fever or sore throat, will give amoxicillin 500 mg PO BID x 7 days.  ED Provider: , PA-C   Wynetta Emery 04/14/2014, 11:46 AM Infectious Diseases Pharmacist Phone# [phone]

## 2014-06-15 ENCOUNTER — Emergency Department (HOSPITAL_COMMUNITY)
Admission: EM | Admit: 2014-06-15 | Discharge: 2014-06-15 | Disposition: A | Discharge status: Home / Self Care | Payer: No Typology Code available for payment source | Attending: Emergency Medicine | Admitting: Emergency Medicine

## 2014-06-15 ENCOUNTER — Encounter (HOSPITAL_COMMUNITY): Payer: Self-pay | Admitting: Emergency Medicine

## 2014-06-15 DIAGNOSIS — R51 Headache: Secondary | ICD-10-CM | POA: Insufficient documentation

## 2014-06-15 DIAGNOSIS — F329 Major depressive disorder, single episode, unspecified: Secondary | ICD-10-CM | POA: Insufficient documentation

## 2014-06-15 DIAGNOSIS — F419 Anxiety disorder, unspecified: Secondary | ICD-10-CM | POA: Insufficient documentation

## 2014-06-15 DIAGNOSIS — Z720 Tobacco use: Secondary | ICD-10-CM | POA: Insufficient documentation

## 2014-06-15 DIAGNOSIS — K047 Periapical abscess without sinus: Secondary | ICD-10-CM | POA: Insufficient documentation

## 2014-06-15 DIAGNOSIS — M069 Rheumatoid arthritis, unspecified: Secondary | ICD-10-CM | POA: Insufficient documentation

## 2014-06-15 DIAGNOSIS — R6883 Chills (without fever): Secondary | ICD-10-CM | POA: Insufficient documentation

## 2014-06-15 DIAGNOSIS — Z79899 Other long term (current) drug therapy: Secondary | ICD-10-CM | POA: Insufficient documentation

## 2014-06-15 DIAGNOSIS — K029 Dental caries, unspecified: Secondary | ICD-10-CM | POA: Insufficient documentation

## 2014-06-15 MED ORDER — AMOXICILLIN 500 MG PO CAPS: 500.0000 mg | ORAL_CAPSULE | Freq: Three times a day (TID) | ORAL | Status: DC

## 2014-06-15 MED ORDER — TRAMADOL HCL 50 MG PO TABS: 50.0000 mg | ORAL_TABLET | Freq: Four times a day (QID) | ORAL | Status: DC | PRN

## 2014-06-15 MED ORDER — IBUPROFEN 800 MG PO TABS: 800.0000 mg | ORAL_TABLET | Freq: Three times a day (TID) | ORAL | Status: DC

## 2014-06-15 NOTE — Discharge Instructions (Signed)
Ibuprofen for pain. Ultram for severe pain. Amoxil for infection until all gone. Follow up with a dentist or an oral surgery as referred.    Dental Caries Dental caries is tooth decay. This decay can cause a hole in teeth (cavity) that can get bigger and deeper over time. HOME CARE  Brush and floss your teeth. Do this at least two times a day.  Use a fluoride toothpaste.  Use a mouth rinse if told by your dentist or doctor.  Eat less sugary and starchy foods. Drink less sugary drinks.  Avoid snacking often on sugary and starchy foods. Avoid sipping often on sugary drinks.  Keep regular checkups and cleanings with your dentist.  Use fluoride supplements if told by your dentist or doctor.  Allow fluoride to be applied to teeth if told by your dentist or doctor. Document Released: 05/22/2008 Document Revised: 12/28/2013 Document Reviewed: 08/15/2012 [facility] Patient Information 2015 [place], [place]. This information is not intended to replace advice given to you by your health care provider. Make sure you discuss any questions you have with your health care provider.   Emergency Department Resource Guide 1) Find a Doctor and Pay Out of Pocket Although you won't have to find out who is covered by your insurance plan, it is a good idea to ask around and get recommendations. You will then need to call the office and see if the doctor you have chosen will accept you as a new patient and what types of options they offer for patients who are self-pay. Some doctors offer discounts or will set up payment plans for their patients who do not have insurance, but you will need to ask so you aren't surprised when you get to your appointment.  2) Contact Your Local Health Department Not all health departments have doctors that can see patients for sick visits, but many do, so it is worth a call to see if yours does. If you don't know where your local health department is, you can check in your phone  book. The CDC also has a tool to help you locate your state's health department, and many state websites also have listings of all of their local health departments.  3) Find a Walk-in Clinic If your illness is not likely to be very severe or complicated, you may want to try a walk in clinic. These are popping up all over the country in pharmacies, drugstores, and shopping centers. They're usually staffed by nurse practitioners or physician assistants that have been trained to treat common illnesses and complaints. They're usually fairly quick and inexpensive. However, if you have serious medical issues or chronic medical problems, these are probably not your best option.  No Primary Care Doctor: - Call Health Connect at  [phone] - they can help you locate a primary care doctor that  accepts your insurance, provides certain services, etc. - Physician Referral Service- [phone]  Chronic Pain Problems: Organization         Address  Phone   Notes  [facility]  [phone] Patients need to be referred by their primary care doctor.   Medication Assistance: Organization         Address  Phone   Notes  [facility] Medication [facility] [address] [place], [place] [address] [phone] --Must be a resident of [facility] -- Must have NO insurance coverage whatsoever (no Medicaid/ Medicare, etc.) -- The pt. MUST have a primary care doctor that  directs their care regularly and follows them in the community   [facility]  [phone]   [employer]  [phone]    Agencies that provide inexpensive medical care: Organization         Address  Phone   Notes  [facility]  [phone]   [facility]    [phone]   [facility] [address] [place], [place] [address] [phone]   [facility] [address] [place] [place] [phone]   Planned  Parenthood    [phone]   [facility]    [phone]   Community Health and [facility]  [address], [place] Phone:  [phone], Fax:  [phone] Hours of Operation:  9 am - 6 pm, M-F.  Also accepts Medicaid/Medicare and self-pay.  [facility]  [address], [place] Phone: [phone], Fax: [phone]. Hours of Operation:  8:30 am - 5:30 pm, M-F.  Also accepts Medicaid and self-pay.  [facility] [place] [address], [place] Phone: [phone]   [facility] [address] Natasha Bence [place], [place] [phone], Ext. [phone] Mondays & Thursdays: 7-9 AM.  First 15 patients are seen on a first come, first serve basis.    Medicaid-accepting [facility] Providers:  Organization         Address  Phone   Notes  [facility] [address], [place] [phone] Also accepts self-pay patients.  [facility] [address] Laurell Josephs [place], [place]  [phone]   [facility] [address] [place] [address], [place] [phone]   [facility] [address] [place] [phone]   Renaye Rakers [address], [place]   [phone] Only accepts [place] Access [place] patients after they have their name applied to their card.   Self-Pay (no insurance) in [facility]:  Organization         Address  Phone   Notes  Sickle Cell Patients, [facility] [address] [place], [place] [phone]   [facility] [address] [place], [place] [phone]   [facility] [place]  [address], [place] [phone]   [facility]/Dr. Osei-Bonsu  [address], [place] or [address], [place] [phone] Phone number for both [place] and [place] locations is the same.    Urgent Medical and [facility] [address] [place] [phone]   [facility] [address], [place] or [address] [phone]   [facility] [address] [place] [phone], phone; [phone], fax Sees patients 1st and 3rd Saturday of every month.  Must not qualify for public or private insurance (i.e. Medicaid, Medicare, [place] Health Choice, Veterans' Benefits)  Household income should be no more than 200% of the poverty level The clinic cannot treat you if you are pregnant or think you are pregnant  Sexually transmitted diseases are not treated at the clinic.    Dental Care: Organization         Address  Phone  Notes  [facility] Department of [facility] [address] [place], [place] [phone] Accepts children  up to age 53 who are enrolled in Medicaid or [place] Health Choice; pregnant women with a Medicaid card; and children who have applied for Medicaid or [place] Health Choice, but were declined, whose parents can pay a reduced fee at time of service.  [facility] Department of [facility]  [address], [place] [phone] Accepts children up to age 68 who are enrolled in [place] or [place] Health Choice; pregnant women with a Medicaid card; and children who have applied for Medicaid or [place] Health Choice, but were declined, whose parents can pay a reduced fee at time of service.  [facility]  [address] [place], [place] [phone] Patients are seen by appointment only. Walk-ins are not accepted. [facility] will see patients 40 years of age and older. Monday - Tuesday (8am-5pm) Most Wednesdays (8:30-5pm) $30 per visit, cash only  [facility] Adult Dental Access PRO[facility]  [address], [facility] [phone] Patients are seen by appointment only. Walk-ins are not accepted. [facility] will see patients 75  years of age and older. One Wednesday Evening (Monthly: Volunteer Based).  $30 per visit, cash only  [employer]  [phone] for adults; Children under age 80, call Graduate Pediatric Dentistry at [phone]. Children aged 63-14, please call [phone] to request a pediatric application.  Dental services are provided in all areas of dental care including fillings, crowns and bridges, complete and partial dentures, implants, gum treatment, root canals, and extractions. Preventive care is also provided. Treatment is provided to both adults and children. Patients are selected via a lottery and there is often a waiting list.   [facility] [address] [place]  [phone] [email or web address]   [facility] [address] [place], [place] [phone], Ext. [phone] Second and Fourth Thursday of each month, opens at 6:30 AM; Clinic ends at 9 AM.  Patients are seen on a first-come first-served basis, and a limited number are seen during each clinic.   [facility]  [address] Ether Griffins [place], [place] [phone]   Eligibility Requirements You must have lived in [place], [place], or [place] counties for at least the last three months.   You cannot be eligible for state or federal sponsored [place], including [employer], [place], or [employer].   You generally cannot be eligible for healthcare insurance through your employer.    How to apply: Eligibility screenings are held every Tuesday and Wednesday afternoon from 1:00 pm until 4:00 pm. You do not need an appointment for the interview!  [facility] [address], [place], [place] [phone]   [facility] Health Department  [phone]   [facility] Health Department  [phone]   [facility] Health Department  [phone]    Behavioral Health Resources in the Community: Intensive Outpatient  Programs Organization         Address  Phone  Notes  [facility] [address], [place], [place] [phone]   [facility] Outpatient [address], [place], [place] [phone]   [facility] [address], [place], [place]  [phone]   [facility] Mental Health [address]  [place], [place] [phone] or [phone]   Substance Abuse Resources Organization         Address  Phone  Notes  [facility] Drug Services  [phone]   [facility]  [phone]   [facility]  [phone]   Floydene Flock  [phone]   Residential & Outpatient Substance Abuse Program  [phone]   Psychological Services Organization         Address  Phone  Notes  [facility] Behavioral Health  [phone]   [facility] Services  [phone]   [facility] Mental Health [address] [place] [phone] or [phone]    Mobile Crisis Teams Organization         Address  Phone  Notes  [facility], Mobile Crisis Care Unit  [phone]   [facility]  [address] [place], [place] [phone]   Doristine Locks [address] [place] [phone]    Self-Help/Support Groups Organization         Address  Phone             Notes  [facility] [place] - variety of support groups  336- I7437963 Call for more information  Narcotics Anonymous (NA), [facility] [address], [employer] [place]  2 meetings at this location   Statistician         Address  Phone  Notes  [facility] [address],    [place]  [phone]   [facility]  [address] [place] [address], [place], [place] [phone]   [facility] [address] [place], [place] [phone] Admissions: 8am-3pm M-F  [facility] [address],    [place], [place]  [phone]   [facility] [address] [place], [place], [place] [phone]   [facility] [address]  [place], [place] [phone]   [facility] - Intensive Outpatient [address] Laurell Josephs [address], [place], [place] [phone]   [facility] (Addiction [facility] [address] [place]  [employer] [place], [place] [phone] or [phone]   [facility] ([facility]) [address], [place], [place] [phone] Accepts Medicaid  [facility] [address]  [place] [phone] Substance Abuse/Addiction Treatment   [facility] Organization         Address  Phone  Notes  [facility]  [phone]   Angie Fava, PhD [address] Ervin Knack [place], [place]   [phone] or [phone]   [facility]   [address] [place], [place] [phone]   [facility] [address], [place], [place] [phone] Insurance/Medicaid/sponsorship through [facility] and Families [address]                                    [place], [place] [phone] Therapy/tele-psych/case  [facility] [address], [place] [phone]    Dr. Lolly Mustache  [phone]   [facility]  [employer] [facility] Dept. 1) [address] [place] 2) [address], [place] 3)  [address], [place] [phone]  [phone]   [facility] Child Abuse Hotline 313-774-4563 or [phone] (After Hours)

## 2014-06-15 NOTE — ED Provider Notes (Signed)
Medical screening examination/treatment/procedure(s) were performed by non-physician practitioner and as supervising physician I was immediately available for consultation/collaboration.   EKG Interpretation None        David H Yao, MD 06/15/14 2335 

## 2014-06-15 NOTE — ED Provider Notes (Signed)
CSN: [id number]     Arrival date & time 06/15/14  1243 History  This chart was scribed for Lottie Mussel, PA with Shon Baton, MD by Tonye Royalty, ED Scribe. This patient was seen in room WTR5/WTR5 and the patient's care was started at 1:30 PM.    Chief Complaint  Patient presents with  . Dental Pain   The history is provided by the patient. No language interpreter was used.    HPI Comments: Steven Barker is a 28 y.o. male who presents to the Emergency Department complaining of left upper dental pain with onset 1 week ago. He states the tooth broke 1 month ago but has not hurt until now. He reports associated pain to his left forehead and left jaw. He reports prior dental infection to his right jaw but not to the current area. He reports using Tylenol with minimal improvement. He reports difficulty chewing secondary to pain. He denies problems with swallowing. He reports associated chills. He states he does not have a dentist. He denies fever or ear pain.  Past Medical History  Diagnosis Date  . Anxiety   . Depression   . Rheumatoid arthritis(714.0)    History reviewed. No pertinent past surgical history. History reviewed. No pertinent family history. History  Substance Use Topics  . Smoking status: Current Every Day Smoker -- 0.50 packs/day for 10 years    Types: Cigarettes  . Smokeless tobacco: Not on file  . Alcohol Use: Yes    Review of Systems  Constitutional: Positive for chills. Negative for fever.  HENT: Positive for dental problem. Negative for ear pain and trouble swallowing.   Neurological: Positive for headaches.      Allergies  Clindamycin/lincomycin  Home Medications   Prior to Admission medications   Medication Sig Start Date End Date Taking? Authorizing Provider  albuterol (PROVENTIL HFA;VENTOLIN HFA) 108 (90 BASE) MCG/ACT inhaler Inhale 1-2 puffs into the lungs every 6 (six) hours as needed for wheezing or shortness of breath.    Historical  Provider, MD  HYDROcodone-acetaminophen (NORCO/VICODIN) 5-325 MG per tablet Take 1 tablet by mouth every 6 (six) hours as needed for moderate pain. 04/12/14   Elwin Mocha, MD  traMADol (ULTRAM) 50 MG tablet Take 50 mg by mouth every 6 (six) hours as needed for moderate pain.    Historical Provider, MD   BP 120/67  Pulse 76  Temp(Src) 98.2 F (36.8 C) (Oral)  Resp 16  SpO2 100% Physical Exam  Nursing note and vitals reviewed. Constitutional: He is oriented to person, place, and time. He appears well-developed and well-nourished.  HENT:  Head: Normocephalic and atraumatic.  Poor Dentition. Partially broken and left upper second molar, with DKA. Surrounding gum erythema, no swelling. Tender to palpation. Facial swelling. No tenderness or swelling under the tongue. No trismus.  Eyes: Conjunctivae are normal.  Neck: Normal range of motion. Neck supple.  Pulmonary/Chest: Effort normal.  Musculoskeletal: Normal range of motion.  Neurological: He is alert and oriented to person, place, and time.  Skin: Skin is warm and dry.  Psychiatric: He has a normal mood and affect.    ED Course  Procedures (including critical care time) Labs Review Labs Reviewed - No data to display  Imaging Review No results found.   EKG Interpretation None     DIAGNOSTIC STUDIES: Oxygen Saturation is 100% on room air, normal by my interpretation.    COORDINATION OF CARE: 1:34 PM Discussed treatment plan with patient at beside, including antibiotics, referral  to an Transport planner, and resource guide. The patient agrees with the plan and has no further questions at this time.   MDM   Final diagnoses:  Infected dental carries    Patient with left upper dental pain, some gum erythema, no swelling. No obvious evidence of a dental abscess. Will treat with amoxicillin, pain medications, follow with a dentist. At this time no evidence of Ludwig's angina, no evidence of systemic infection, no other emergent  findings  Filed Vitals:   06/15/14 1316  BP: 120/67  Pulse: 76  Temp: 98.2 F (36.8 C)  TempSrc: Oral  Resp: 16  SpO2: 100%    I personally performed the services described in this documentation, which was scribed in my presence. The recorded information has been reviewed and is accurate.   Lottie Mussel, PA-C 06/15/14 1837

## 2014-06-15 NOTE — ED Notes (Signed)
Pt c/o L upper dental pain x 1 week.  Pain score 10/10.  Pt reports taking tylenol w/o relief.

## 2014-11-04 ENCOUNTER — Encounter (HOSPITAL_COMMUNITY): Payer: Self-pay | Admitting: Emergency Medicine

## 2014-11-04 ENCOUNTER — Emergency Department (HOSPITAL_COMMUNITY)
Admission: EM | Admit: 2014-11-04 | Discharge: 2014-11-05 | Disposition: A | Discharge status: Home / Self Care | Payer: Federal, State, Local not specified - Other | Attending: Emergency Medicine | Admitting: Emergency Medicine

## 2014-11-04 DIAGNOSIS — M1990 Unspecified osteoarthritis, unspecified site: Secondary | ICD-10-CM | POA: Insufficient documentation

## 2014-11-04 DIAGNOSIS — R1012 Left upper quadrant pain: Secondary | ICD-10-CM | POA: Insufficient documentation

## 2014-11-04 DIAGNOSIS — Z7982 Long term (current) use of aspirin: Secondary | ICD-10-CM | POA: Insufficient documentation

## 2014-11-04 DIAGNOSIS — F1994 Other psychoactive substance use, unspecified with psychoactive substance-induced mood disorder: Secondary | ICD-10-CM | POA: Diagnosis present

## 2014-11-04 DIAGNOSIS — R45851 Suicidal ideations: Secondary | ICD-10-CM

## 2014-11-04 DIAGNOSIS — F1910 Other psychoactive substance abuse, uncomplicated: Secondary | ICD-10-CM | POA: Diagnosis present

## 2014-11-04 DIAGNOSIS — R197 Diarrhea, unspecified: Secondary | ICD-10-CM | POA: Insufficient documentation

## 2014-11-04 DIAGNOSIS — Z720 Tobacco use: Secondary | ICD-10-CM | POA: Insufficient documentation

## 2014-11-04 LAB — CBC WITH DIFFERENTIAL/PLATELET
BASOS ABS: 0 10*3/uL (ref 0.0–0.1)
Basophils Relative: 0 % (ref 0–1)
EOS ABS: 0 10*3/uL (ref 0.0–0.7)
Eosinophils Relative: 0 % (ref 0–5)
HCT: 50.8 % (ref 39.0–52.0)
Hemoglobin: 17.3 g/dL — ABNORMAL HIGH (ref 13.0–17.0)
Lymphocytes Relative: 12 % (ref 12–46)
Lymphs Abs: 1.4 10*3/uL (ref 0.7–4.0)
MCH: 32.2 pg (ref 26.0–34.0)
MCHC: 34.1 g/dL (ref 30.0–36.0)
MCV: 94.4 fL (ref 78.0–100.0)
Monocytes Absolute: 0.5 10*3/uL (ref 0.1–1.0)
Monocytes Relative: 5 % (ref 3–12)
NEUTROS PCT: 83 % — AB (ref 43–77)
Neutro Abs: 9 10*3/uL — ABNORMAL HIGH (ref 1.7–7.7)
PLATELETS: 309 10*3/uL (ref 150–400)
RBC: 5.38 MIL/uL (ref 4.22–5.81)
RDW: 13.5 % (ref 11.5–15.5)
WBC: 10.9 10*3/uL — ABNORMAL HIGH (ref 4.0–10.5)

## 2014-11-04 LAB — URINALYSIS, ROUTINE W REFLEX MICROSCOPIC
GLUCOSE, UA: NEGATIVE mg/dL
Hgb urine dipstick: NEGATIVE
Ketones, ur: 40 mg/dL — AB
Leukocytes, UA: NEGATIVE
Nitrite: NEGATIVE
PH: 5.5 (ref 5.0–8.0)
PROTEIN: NEGATIVE mg/dL
Specific Gravity, Urine: 1.025 (ref 1.005–1.030)
Urobilinogen, UA: 0.2 mg/dL (ref 0.0–1.0)

## 2014-11-04 LAB — COMPREHENSIVE METABOLIC PANEL
ALK PHOS: 66 U/L (ref 39–117)
ALT: 13 U/L (ref 0–53)
AST: 25 U/L (ref 0–37)
Albumin: 4.9 g/dL (ref 3.5–5.2)
Anion gap: 11 (ref 5–15)
BUN: 16 mg/dL (ref 6–23)
CO2: 26 mmol/L (ref 19–32)
CREATININE: 1.19 mg/dL (ref 0.50–1.35)
Calcium: 9.9 mg/dL (ref 8.4–10.5)
Chloride: 102 mmol/L (ref 96–112)
GFR, EST NON AFRICAN AMERICAN: 82 mL/min — AB (ref >90)
Glucose, Bld: 98 mg/dL (ref 70–99)
Potassium: 4.7 mmol/L (ref 3.5–5.1)
Sodium: 139 mmol/L (ref 135–145)
Total Bilirubin: 1 mg/dL (ref 0.3–1.2)
Total Protein: 8.2 g/dL (ref 6.0–8.3)

## 2014-11-04 LAB — POC OCCULT BLOOD, ED: Fecal Occult Bld: NEGATIVE

## 2014-11-04 LAB — LIPASE, BLOOD: Lipase: 18 U/L (ref 11–59)

## 2014-11-04 MED ORDER — MORPHINE SULFATE 4 MG/ML IJ SOLN
4.0000 mg | Freq: Once | INTRAMUSCULAR | Status: AC
  Administered 2014-11-04: 4 mg via INTRAVENOUS
  Filled 2014-11-04: qty 1

## 2014-11-04 MED ORDER — ONDANSETRON HCL 4 MG/2ML IJ SOLN
4.0000 mg | Freq: Once | INTRAMUSCULAR | Status: AC
  Administered 2014-11-04: 4 mg via INTRAVENOUS
  Filled 2014-11-04: qty 2

## 2014-11-04 MED ORDER — SODIUM CHLORIDE 0.9 % IV BOLUS (SEPSIS)
1000.0000 mL | Freq: Once | INTRAVENOUS | Status: AC
Start: 2014-11-04 — End: 2014-11-04
  Administered 2014-11-04: 1000 mL via INTRAVENOUS

## 2014-11-04 MED ORDER — ONDANSETRON 4 MG PO TBDP: 4.0000 mg | ORAL_TABLET | Freq: Three times a day (TID) | ORAL | Status: DC | PRN

## 2014-11-04 MED ORDER — TRAMADOL HCL 50 MG PO TABS: 50.0000 mg | ORAL_TABLET | Freq: Four times a day (QID) | ORAL | Status: DC | PRN

## 2014-11-04 NOTE — BH Assessment (Addendum)
Tele Assessment Note   Steven Barker is an 29 y.o. male who originally presented to [facility] for medical complaits of abdominal pain; however, upon d/c, pt began to express SI with a plan to jump off of a bridge. He presents with slightly depressed mood and mood-congruent affect. Thought process is linear and speech is relevant but sometimes difficult to understand. Pt acknowledges current SI but denies HI. He denies any current A/VH but says that he has experienced hallucinations on 2 occassions, which he believe was due to sleep deprivation.Reported depressive sx include insomnia, hopelessness, lack of appetite, tearfulness, irritability, and anger outbursts, etc. Pt says has not eaten in about four days and believes he has lost 10 lbs in the past month. Pt endorses substances, including etoh, marijuana, and cocaine. Pt states that he has not been compliant with his medications for a while due to finances "and the way they make me feel"  Pt acknowledges a hx of verbal and physical aggression when angry, adding that he punches holes in the wall, and has even banged his head into the wall of occasion when angry/frustrated. He adds, "I feel like I can't control myself when I'm like that". Pt endorses a hx of six suicide attmpts in the past via hanging. He reports receiving mental health services from [facility] in the past. He was admitted to [facility] in 2014 & 201.Family hx is positive for MH and SA problems. No noted abuse towards pt, but pt does report that both of his parents suffered from mental illness, which was difficult for him growing up.  Per Hulan Fess, NP, pt meets inpt criteria for placement on dual-dx floor; Per Joann, RN/AC, pt cannot be accepted due to a relative currently being on the unit. TTS To seek placement elsewhere.    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 Anxiety   . Depression   . Rheumatoid arthritis(714.0)    Axis IV: economic problems, educational problems, housing problems, occupational problems, other psychosocial or environmental problems, problems related to legal system/crime, problems related to social environment, problems with access to health care services and problems with primary support group Axis V: 51-60 moderate symptoms  Past Medical History:  Past Medical History  Diagnosis Date  . Anxiety   . Depression   . Rheumatoid arthritis(714.0)     No past surgical history on file.  Family History: No family history on file.  Social History:  reports that he has been smoking Cigarettes.  He has a 5 pack-year smoking history. He does not have any smokeless tobacco history on file. He reports that he drinks alcohol. He reports that he does not use illicit drugs.  Additional Social History:  Alcohol / Drug Use Pain Medications: See PTA List Prescriptions: See PTA List Over the Counter: See PTA List History of alcohol / drug use?: Yes Longest period of sobriety (when/how long): A few weeks Negative Consequences of Use: Personal relationships, Legal, Work / School Substance #1 Name of Substance 1: THC 1 - Age of First Use: 12 1 - Amount (size/oz): 3 blunts 1 - Frequency: Weekly 1 - Duration: 15+ years 1 - Last Use / Amount: Yesterday, 11/03/14 Substance #  2 Name of Substance 2: Etoh 2 - Age of First Use: 21 2 - Amount (size/oz): 12 beers 2 - Frequency: On Weekends 2 - Duration: 8 years 2 - Last Use / Amount: 2 days ago, 11/02/14 Substance #3 Name of Substance 3: Cocaine 3 - Age of First Use: 23 3 - Amount (size/oz): Unknown 3 - Frequency: 1-2x per month 3 - Duration: Past 6 years, intermittently 3 - Last Use / Amount: Thisweek  CIWA: CIWA-Ar BP: (!) 112/53 mmHg Pulse Rate: 60 COWS:    PATIENT  STRENGTHS: (choose at least two) Ability for insight Average or above average intelligence Capable of independent living Communication skills General fund of knowledge Physical Health Special hobby/interest Supportive family/friends Work skills  Allergies:  Allergies  Allergen Reactions  . Clindamycin/Lincomycin Nausea And Vomiting    Patient stated that he felt like he was going to die    Home Medications:  (Not in a hospital admission)  OB/GYN Status:  No LMP for male patient.  General Assessment Data Location of Assessment: [facility] ED Is this a Tele or Face-to-Face Assessment?: Face-to-Face Is this an Initial Assessment or a Re-assessment for this encounter?: Initial Assessment Living Arrangements: Parent, Other relatives Can pt return to current living arrangement?: Yes Admission Status: Voluntary Is patient capable of signing voluntary admission?: Yes Transfer from: Home Referral Source: Self/Family/Friend     [facility] Crisis Care Plan Living Arrangements: Parent, Other relatives Name of Psychiatrist: None (Went to [facility] in the past) Name of Therapist: None  Education Status Is patient currently in school?: No Current Grade: na Highest grade of school patient has completed: 8 Name of school: na Contact person: na  Risk to self with the past 6 months Suicidal Ideation: Yes-Currently Present Suicidal Intent: No-Not Currently/Within Last 6 Months Is patient at risk for suicide?: Yes Suicidal Plan?: Yes-Currently Present Specify Current Suicidal Plan: Jump off a bridge Access to Means: Yes Specify Access to Suicidal Means: Roadways, bridges, etc. What has been your use of drugs/alcohol within the last 12 months?: THC, etoh, & cocaine use Previous Attempts/Gestures: Yes How many times?: 6 Other Self Harm Risks: Anger Management Px, Bangs head on wall when frustrated Triggers for Past Attempts: Family contact, Other personal contacts, Unknown Intentional Self  Injurious Behavior: Damaging Comment - Self Injurious Behavior: Attempted hanging of self, banging head on wall, etc. Family Suicide History: Unknown Recent stressful life event(s): Conflict (Comment), Financial Problems, Legal Issues Persecutory voices/beliefs?: No Depression: Yes Depression Symptoms: Insomnia, Tearfulness, Isolating, Fatigue, Feeling angry/irritable Substance abuse history and/or treatment for substance abuse?: No Suicide prevention information given to non-admitted patients: Not applicable  Risk to Others within the past 6 months Homicidal Ideation: No Thoughts of Harm to Others: No Current Homicidal Intent: No Current Homicidal Plan: No Access to Homicidal Means: No History of harm to others?: No Assessment of Violence: In past 6-12 months Violent Behavior Description: [place] aggression, Punches holes in walls. Does patient have access to weapons?: No Criminal Charges Pending?: Yes Describe Pending Criminal Charges: Unknown Does patient have a court date: Yes Court Date: 11/06/14  Psychosis Hallucinations: Auditory (Says he's only had A/VH 2x in his life) Delusions: None noted  Mental Status Report Appear/Hygiene: In scrubs Eye Contact: Fair Motor Activity: Freedom of movement Speech: Logical/coherent Level of Consciousness: Quiet/awake Mood: Euthymic Affect: Constricted Anxiety Level: Minimal Thought Processes: Thought Blocking Judgement: Partial Orientation: Person, Place, Time, Situation  Cognitive Functioning Concentration: Decreased Memory: Recent Intact IQ: Average Insight: Fair Impulse Control: Fair Appetite: Poor  Weight Loss: 9 Weight Gain: 0 Sleep: No Change Total Hours of Sleep: 5 Vegetative Symptoms: Staying in bed  ADLScreening [facility] Assessment Services) Patient's cognitive ability adequate to safely complete daily activities?: Yes Patient able to express need for assistance with ADLs?: Yes Independently performs ADLs?: Yes  (appropriate for developmental age)  Prior Inpatient Therapy Prior Inpatient Therapy: No Prior Therapy Dates: 2014-2015 Prior Therapy Facilty/Provider(s): [facility] Reason for Treatment: SI  Prior Outpatient Therapy Prior Outpatient Therapy: No  ADL Screening (condition at time of admission) Patient's cognitive ability adequate to safely complete daily activities?: Yes Is the patient deaf or have difficulty hearing?: No Does the patient have difficulty seeing, even when wearing glasses/contacts?: No Does the patient have difficulty concentrating, remembering, or making decisions?: No Patient able to express need for assistance with ADLs?: Yes Does the patient have difficulty dressing or bathing?: No Independently performs ADLs?: Yes (appropriate for developmental age) Does the patient have difficulty walking or climbing stairs?: No Weakness of Legs: None Weakness of Arms/Hands: None  Home Assistive Devices/Equipment Home Assistive Devices/Equipment: None    Abuse/Neglect Assessment (Assessment to be complete while patient is alone) Physical Abuse: Denies Verbal Abuse:  (Pt does report groing up w/ mentally-ill parents, and his father abused drugs as well) Sexual Abuse: Denies Exploitation of patient/patient's resources: Denies Self-Neglect: Denies Values / Beliefs Cultural Requests During Hospitalization: None Spiritual Requests During Hospitalization: None   Advance Directives (For Healthcare) Does patient have an advance directive?: No Would patient like information on creating an advanced directive?: No - patient declined information    Additional Information 1:1 In Past 12 Months?: No CIRT Risk: No Elopement Risk: No Does patient have medical clearance?: Yes     Disposition: Per Hulan Fess, NP, pt meets inpt criteria for placement on dual-dx floor; Per Joann, RN/AC, pt cannot be accepted due to a relative currently being on the unit.  TTS To seek placement  elsewhere.   Disposition Initial Assessment Completed for this Encounter: Yes Disposition of Patient: Inpatient treatment program Type of inpatient treatment program: Adult (*Dual-dx placement recommended*)  Cyndie Mull, [facility] Triage Specialist  11/05/2014 3:26 AM

## 2014-11-04 NOTE — ED Notes (Signed)
EMS reports LUQ pain that radiates to R side. Intermittent sharp pain. Rectal bleeding for past 2 weeks. Also has some emesis at times.

## 2014-11-04 NOTE — ED Notes (Signed)
TTS at bedside. 

## 2014-11-04 NOTE — ED Provider Notes (Signed)
CSN: [id number]     Arrival date & time 11/04/14  1221 History   First MD Initiated Contact with Patient 11/04/14 1609     Chief Complaint  Patient presents with  . Flank Pain  . Rectal Bleeding     (Consider location/radiation/quality/duration/timing/severity/associated sxs/prior Treatment) HPI Comments: Patient with multiple month history of LUQ pain, worsening over the last few weeks, now associated with nausea, occasional emesis, lack of appetite.  Bright red blood noted on toilet paper after bowel movements.  Patient is a 29 y.o. male presenting with flank pain and hematochezia. The history is provided by the patient. No language interpreter was used.  Flank Pain This is a recurrent problem. The current episode started more than 1 month ago. The problem occurs constantly. The problem has been waxing and waning. Associated symptoms include abdominal pain, anorexia, fatigue and nausea.  Rectal Bleeding Associated symptoms: abdominal pain     Past Medical History  Diagnosis Date  . Anxiety   . Depression   . Rheumatoid arthritis(714.0)    No past surgical history on file. No family history on file. History  Substance Use Topics  . Smoking status: Current Every Day Smoker -- 0.50 packs/day for 10 years    Types: Cigarettes  . Smokeless tobacco: Not on file  . Alcohol Use: Yes    Review of Systems  Constitutional: Positive for fatigue.  Gastrointestinal: Positive for nausea, abdominal pain, diarrhea, blood in stool, hematochezia and anorexia.  Genitourinary: Positive for flank pain.  All other systems reviewed and are negative.     Allergies  Clindamycin/lincomycin  Home Medications   Prior to Admission medications   Medication Sig Start Date End Date Taking? Authorizing Provider  acetaminophen (TYLENOL) 325 MG tablet Take 1,300 mg by mouth every 6 (six) hours as needed for moderate pain.   Yes Historical Provider, MD  aspirin 325 MG tablet Take 650 mg by mouth  daily.   Yes Historical Provider, MD  amoxicillin (AMOXIL) 500 MG capsule Take 1 capsule (500 mg total) by mouth 3 (three) times daily. 06/15/14   Tatyana Kirichenko, PA-C  HYDROcodone-acetaminophen (NORCO/VICODIN) 5-325 MG per tablet Take 1 tablet by mouth every 6 (six) hours as needed for moderate pain. 04/12/14   Elwin Mocha, MD  ibuprofen (ADVIL,MOTRIN) 800 MG tablet Take 1 tablet (800 mg total) by mouth 3 (three) times daily. 06/15/14   Tatyana Kirichenko, PA-C  traMADol (ULTRAM) 50 MG tablet Take 1 tablet (50 mg total) by mouth every 6 (six) hours as needed. 06/15/14   Tatyana Kirichenko, PA-C   BP 122/83 mmHg  Pulse 84  Temp(Src) 98.9 F (37.2 C) (Oral)  Resp 18  SpO2 100% Physical Exam  Constitutional: He is oriented to person, place, and time. He appears well-developed and well-nourished.  HENT:  Head: Normocephalic.  Eyes: Pupils are equal, round, and reactive to light.  Neck: Neck supple.  Cardiovascular: Normal rate and regular rhythm.   Pulmonary/Chest: Effort normal and breath sounds normal.  Abdominal: Soft. Bowel sounds are normal. There is tenderness.  Musculoskeletal: He exhibits no edema or tenderness.  Lymphadenopathy:    He has no cervical adenopathy.  Neurological: He is alert and oriented to person, place, and time.  Skin: Skin is warm and dry.  Psychiatric: He has a normal mood and affect.  Nursing note and vitals reviewed.   ED Course  Procedures (including critical care time) Labs Review Labs Reviewed  CBC WITH DIFFERENTIAL/PLATELET - Abnormal; Notable for the following:  WBC 10.9 (*)    Hemoglobin 17.3 (*)    Neutrophils Relative % 83 (*)    Neutro Abs 9.0 (*)    All other components within normal limits  COMPREHENSIVE METABOLIC PANEL - Abnormal; Notable for the following:    GFR calc non Af Amer 82 (*)    All other components within normal limits  URINALYSIS, ROUTINE W REFLEX MICROSCOPIC - Abnormal; Notable for the following:    Bilirubin  Urine SMALL (*)    Ketones, ur 40 (*)    All other components within normal limits  LIPASE, BLOOD    Imaging Review No results found.   EKG Interpretation None     Patient feeling better after IV fluids and medication.  Is currently eating without difficulty.  Lab results reviewed and shared with patient.  Mild dehydration.  Not anemic.  No occult blood in stool, no external hemorrhoids noted.  No blood in urine.  Non-toxic appearing, stable for discharge.  Return precautions discussed.  Patient without current primary care, resource information has been provided by case management.  RX for limited amount of pain medication and anti-emetic provided.    At time of discharge, patient is now telling the nurse that he is having suicidal ideations.  No specific plan.  Patient does have a history of depression, but I suspect this may be an attempt to find a way to stay in the hospital--will ask TTS to consult and advise.  TTS reports meets criteria for inpatient treatment.  Cannot go to [facility] due to another family member currently being treated at the facility.  Placement pending. MDM   Final diagnoses:  None    Abdominal pain. Suicidal ideation.    Felicie Morn, NP 11/05/14 0100  Mirian Mo, MD 11/08/14 [phone]

## 2014-11-04 NOTE — ED Notes (Signed)
Pt c/o lt flank pain that radiates to abd x 4 months and 1 month of BRB per rectum. Denies vomiting but endorses diarrhea.

## 2014-11-04 NOTE — ED Notes (Signed)
Went in to discharge pt, he states that he also needs "mental help."  He reports SI with plan to jump off a bridge.  Marion Downer notified.  Consult TTS ordered.

## 2014-11-04 NOTE — ED Notes (Signed)
Pt requesting pain med, Steven Barker EDP notified

## 2014-11-05 DIAGNOSIS — F1994 Other psychoactive substance use, unspecified with psychoactive substance-induced mood disorder: Secondary | ICD-10-CM

## 2014-11-05 DIAGNOSIS — R45851 Suicidal ideations: Secondary | ICD-10-CM

## 2014-11-05 MED ORDER — IBUPROFEN 200 MG PO TABS
600.0000 mg | ORAL_TABLET | Freq: Three times a day (TID) | ORAL | Status: DC | PRN
  Administered 2014-11-05: 600 mg via ORAL
  Filled 2014-11-05: qty 3

## 2014-11-05 MED ORDER — ONDANSETRON HCL 4 MG PO TABS: 4.0000 mg | ORAL_TABLET | Freq: Three times a day (TID) | ORAL | Status: DC | PRN

## 2014-11-05 MED ORDER — LORAZEPAM 1 MG PO TABS
1.0000 mg | ORAL_TABLET | Freq: Three times a day (TID) | ORAL | Status: DC | PRN
  Administered 2014-11-05: 1 mg via ORAL
  Filled 2014-11-05: qty 1

## 2014-11-05 MED ORDER — ALUM & MAG HYDROXIDE-SIMETH 200-200-20 MG/5ML PO SUSP: 30.0000 mL | ORAL | Status: DC | PRN

## 2014-11-05 MED ORDER — NICOTINE 21 MG/24HR TD PT24
21.0000 mg | MEDICATED_PATCH | Freq: Once | TRANSDERMAL | Status: DC
  Administered 2014-11-05: 21 mg via TRANSDERMAL
  Filled 2014-11-05: qty 1

## 2014-11-05 NOTE — [facility] Suicide Risk Assessment (Signed)
Suicide Risk Assessment  Discharge Assessment   [facility] Discharge Suicide Risk Assessment   Demographic Factors:  Male and Caucasian  Total Time spent with patient: 45 minutes  Musculoskeletal: Strength & Muscle Tone: within normal limits Gait & Station: normal Patient leans: N/A  Psychiatric Specialty Exam:     Blood pressure 113/71, pulse 56, temperature 98.1 F (36.7 C), temperature source Oral, resp. rate 20, SpO2 100 %.There is no weight on file to calculate BMI.  General Appearance: Casual  Eye Contact::  Good  Speech:  Normal Rate409  Volume:  Normal  Mood:  Irritable  Affect:  Congruent  Thought Process:  Coherent  Orientation:  Full (Time, Place, and Person)  Thought Content:  WDL  Suicidal Thoughts:  No  Homicidal Thoughts:  No  Memory:  Immediate;   Good Recent;   Good Remote;   Good  Judgement:  Fair  Insight:  Fair  Psychomotor Activity:  Normal  Concentration:  Good  Recall:  Good  Fund of Knowledge:Fair  Language: Good  Akathisia:  No  Handed:  Right  AIMS (if indicated):     Assets:  Housing Leisure Time Physical Health Resilience Social Support  Sleep:     Cognition: WNL  ADL's:  Intact      Has this patient used any form of tobacco in the last 30 days? (Cigarettes, Smokeless Tobacco, Cigars, and/or Pipes) Yes, A prescription for an FDA-approved tobacco cessation medication was offered at discharge and the patient refused  Mental Status Per Nursing Assessment::   On Admission:   Flank pain, suicidal ideatoins  Current Mental Status by Physician: NA  Loss Factors: NA  Historical Factors: NA  Risk Reduction Factors:   Sense of responsibility to family, Living with another person, especially a relative, Positive social support and Positive therapeutic relationship  Continued Clinical Symptoms:  Irritable  Cognitive Features That Contribute To Risk:  None    Suicide Risk:  Minimal: No identifiable suicidal ideation.  Patients  presenting with no risk factors but with morbid ruminations; may be classified as minimal risk based on the severity of the depressive symptoms  Principal Problem: Suicidal ideation Discharge Diagnoses:  Patient Active Problem List   Diagnosis Date Noted  . Substance induced mood disorder [F19.94] 11/05/2014    Priority: High  . Suicidal ideation [R45.851] 11/05/2014    Priority: High  . Polysubstance abuse [F19.10] 05/17/2013    Priority: High  . Anxiety state, unspecified [F41.1] 01/01/2014  . 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 Rheumatoid arthritis [M06.9] 05/17/2013    Follow-up Information    Follow up with [facility]    .   Contact information:   [address] [place] [address] [phone]      Follow up with [facility].   Contact information:   [address] [place] [address] [phone]       Plan Of Care/Follow-up recommendations:  Activity:  as tolerated Diet:  heart healthy diet  Is patient on multiple antipsychotic therapies at discharge:  No   Has Patient had three or more failed trials of antipsychotic monotherapy by history:  No  Recommended Plan for Multiple Antipsychotic Therapies: NA    Francella Barnett, PMH-NP 11/05/2014, 10:42 AM

## 2014-11-05 NOTE — BHH Counselor (Signed)
Per Hulan Fess, NP, pt meets inpt criteria for placement on dual-dx floor; Per Joann, RN/AC, pt cannot be accepted due to a his relative currently being on the unit at [facility]. TTS To seek placement elsewhere.     Cyndie Mull, [facility] Triage Specialist

## 2014-11-05 NOTE — Consult Note (Signed)
[facility] Face-to-Face Psychiatry Consult   Reason for Consult:  Suicidal ideations Referring Physician:  EDP Patient Identification: Steven Barker MRN:  [id number] Principal Diagnosis: Suicidal ideation Diagnosis:   Patient Active Problem List   Diagnosis Date Noted  . Substance induced mood disorder [F19.94] 11/05/2014    Priority: High  . Suicidal ideation [R45.851] 11/05/2014    Priority: High  . Polysubstance abuse [F19.10] 05/17/2013    Priority: High  . Anxiety state, unspecified [F41.1] 01/01/2014  . 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 Rheumatoid arthritis [M06.9] 05/17/2013    Total Time spent with patient: 45 minutes  Subjective:   Steven Barker is a 29 y.o. male patient does not warrant admission.  HPI:  The patient came to the ED with flank pain and then mentioned suicidal ideations.  On assessment today, he denied suicidal ideations along with homicidal ideations, hallucinations, and drug use except marijuana.  Steven Barker states he does not drink "much anymore".  He use to go to [facility] for his psychiatric care but has not been in months.  Steven Barker stated he was prescribed Klonopin and Seroquel but has not had them for months.  He requested to leave on assessment, discharged.  Steven Barker also had a court date and wanted paper work for missing his date. HPI Elements:   Location:  generalized. Quality:  acute. Severity:  mild. Timing:  intermittent. Duration:  brief. Context:  pain.  Past Medical History:  Past Medical History  Diagnosis Date  . Anxiety   . Depression   . Rheumatoid arthritis(714.0)    No past surgical history on file. Family History: No family history on file. Social History:  History  Alcohol Use  . Yes     History  Drug Use No    History   Social History  . Marital Status: Single    Spouse Name: N/A  . Number of Children: N/A  . Years of Education: N/A   Social History Main Topics  . Smoking  status: Current Every Day Smoker -- 0.50 packs/day for 10 years    Types: Cigarettes  . Smokeless tobacco: Not on file  . Alcohol Use: Yes  . Drug Use: No  . Sexual Activity: Yes    Birth Control/ Protection: None   Other Topics Concern  . None   Social History Narrative   Additional Social History:    Pain Medications: See PTA List Prescriptions: See PTA List Over the Counter: See PTA List History of alcohol / drug use?: Yes Longest period of sobriety (when/how long): A few weeks Negative Consequences of Use: Personal relationships, Legal, Work / School Name of Substance 1: THC 1 - Age of First Use: 12 1 - Amount (size/oz): 3 blunts 1 - Frequency: Weekly 1 - Duration: 15+ years 1 - Last Use / Amount: Yesterday, 11/03/14 Name of Substance 2: Etoh 2 - Age of First Use: 21 2 - Amount (size/oz): 12 beers 2 - Frequency: On Weekends 2 - Duration: 8 years 2 - Last Use / Amount: 2 days ago, 11/02/14 Name of Substance 3: Cocaine 3 - Age of First Use: 23 3 - Amount (size/oz): Unknown 3 - Frequency: 1-2x per month 3 - Duration: Past 6 years, intermittently 3 - Last Use / Amount: Thisweek               Allergies:   Allergies  Allergen Reactions  . Clindamycin/Lincomycin Nausea And Vomiting    Patient stated that he felt like he was  going to die    Vitals: Blood pressure 113/71, pulse 56, temperature 98.1 F (36.7 C), temperature source Oral, resp. rate 20, SpO2 100 %.  Risk to Self: Suicidal Ideation: Yes-Currently Present Suicidal Intent: No-Not Currently/Within Last 6 Months Is patient at risk for suicide?: Yes Suicidal Plan?: Yes-Currently Present Specify Current Suicidal Plan: Jump off a bridge Access to Means: Yes Specify Access to Suicidal Means: Roadways, bridges, etc. What has been your use of drugs/alcohol within the last 12 months?: THC, etoh, & cocaine use How many times?: 6 Other Self Harm Risks: Anger Management Px, Bangs head on wall when  frustrated Triggers for Past Attempts: Family contact, Other personal contacts, Unknown Intentional Self Injurious Behavior: Damaging Comment - Self Injurious Behavior: Attempted hanging of self, banging head on wall, etc. Risk to Others: Homicidal Ideation: No Thoughts of Harm to Others: No Current Homicidal Intent: No Current Homicidal Plan: No Access to Homicidal Means: No History of harm to others?: No Assessment of Violence: In past 6-12 months Violent Behavior Description: [place] aggression, Punches holes in walls. Does patient have access to weapons?: No Criminal Charges Pending?: Yes Describe Pending Criminal Charges: Unknown Does patient have a court date: Yes Court Date: 11/06/14 Prior Inpatient Therapy: Prior Inpatient Therapy: No Prior Therapy Dates: 2014-2015 Prior Therapy Facilty/Provider(s): [facility] Reason for Treatment: SI Prior Outpatient Therapy: Prior Outpatient Therapy: No  Current Facility-Administered Medications  Medication Dose Route Frequency Provider Last Rate Last Dose  . alum & mag hydroxide-simeth (MAALOX/MYLANTA) 200-200-20 MG/5ML suspension 30 mL  30 mL Oral PRN Felicie Morn, NP      . ibuprofen (ADVIL,MOTRIN) tablet 600 mg  600 mg Oral Q8H PRN Felicie Morn, NP   600 mg at 11/05/14 1022  . nicotine (NICODERM CQ - dosed in mg/24 hours) patch 21 mg  21 mg Transdermal Once Felicie Morn, NP   21 mg at 11/05/14 0055  . ondansetron (ZOFRAN) tablet 4 mg  4 mg Oral Q8H PRN Felicie Morn, NP       Current Outpatient Prescriptions  Medication Sig Dispense Refill  . acetaminophen (TYLENOL) 325 MG tablet Take 1,300 mg by mouth every 6 (six) hours as needed for moderate pain.    Marland Kitchen aspirin 325 MG tablet Take 650 mg by mouth daily.    Marland Kitchen amoxicillin (AMOXIL) 500 MG capsule Take 1 capsule (500 mg total) by mouth 3 (three) times daily. 30 capsule 0  . HYDROcodone-acetaminophen (NORCO/VICODIN) 5-325 MG per tablet Take 1 tablet by mouth every 6 (six) hours as needed for moderate  pain. 20 tablet 0  . ibuprofen (ADVIL,MOTRIN) 800 MG tablet Take 1 tablet (800 mg total) by mouth 3 (three) times daily. 21 tablet 0  . ondansetron (ZOFRAN ODT) 4 MG disintegrating tablet Take 1 tablet (4 mg total) by mouth every 8 (eight) hours as needed for vomiting. 4 tablet 0  . traMADol (ULTRAM) 50 MG tablet Take 1 tablet (50 mg total) by mouth every 6 (six) hours as needed. 15 tablet 0   Musculoskeletal: Strength & Muscle Tone: within normal limits Gait & Station: normal Patient leans: N/A  Psychiatric Specialty Exam:     Blood pressure 113/71, pulse 56, temperature 98.1 F (36.7 C), temperature source Oral, resp. rate 20, SpO2 100 %.There is no weight on file to calculate BMI.  General Appearance: Casual  Eye Contact::  Good  Speech:  Normal Rate409  Volume:  Normal  Mood:  Irritable  Affect:  Congruent  Thought Process:  Coherent  Orientation:  Full (  Time, Place, and Person)  Thought Content:  WDL  Suicidal Thoughts:  No  Homicidal Thoughts:  No  Memory:  Immediate;   Good Recent;   Good Remote;   Good  Judgement:  Fair  Insight:  Fair  Psychomotor Activity:  Normal  Concentration:  Good  Recall:  Good  Fund of Knowledge:Fair  Language: Good  Akathisia:  No  Handed:  Right  AIMS (if indicated):     Assets:  Housing Leisure Time Physical Health Resilience Social Support  Sleep:     Cognition: WNL  ADL's:  Intact   Medical Decision Making: Review of Psycho-Social Stressors (1), Review or order clinical lab tests (1) and Review of Medication Regimen & Side Effects (2)  Treatment Plan Summary: Daily contact with patient to assess and evaluate symptoms and progress in treatment, Medication management and Plan discharge home with follow-up resources for out-patient treatment  Plan:  No evidence of imminent risk to self or others at present.   Disposition: Discharge home with follow-up resources for out-patient treatment  Nanine Means, PMH-NP 11/05/2014 10:47  AM Patient seen face-to-face for psychiatric evaluation, chart reviewed and case discussed with the physician extender and developed treatment plan. Reviewed the information documented and agree with the treatment plan. Thedore Mins, MD

## 2014-11-05 NOTE — BH Assessment (Signed)
Referrals sent to:  [facility] [place], [place] Triage Specialist 11/05/2014 5:46 AM

## 2014-11-05 NOTE — Discharge Instructions (Signed)
Abdominal Pain Many things can cause abdominal pain. Usually, abdominal pain is not caused by a disease and will improve without treatment. It can often be observed and treated at home. Your health care provider will do a physical exam and possibly order blood tests and X-rays to help determine the seriousness of your pain. However, in many cases, more time must pass before a clear cause of the pain can be found. Before that point, your health care provider may not know if you need more testing or further treatment. HOME CARE INSTRUCTIONS  Monitor your abdominal pain for any changes. The following actions may help to alleviate any discomfort you are experiencing:  Only take over-the-counter or prescription medicines as directed by your health care provider.  Do not take laxatives unless directed to do so by your health care provider.  Try a clear liquid diet (broth, tea, or water) as directed by your health care provider. Slowly move to a bland diet as tolerated. SEEK MEDICAL CARE IF:  You have unexplained abdominal pain.  You have abdominal pain associated with nausea or diarrhea.  You have pain when you urinate or have a bowel movement.  You experience abdominal pain that wakes you in the night.  You have abdominal pain that is worsened or improved by eating food.  You have abdominal pain that is worsened with eating fatty foods.  You have a fever. SEEK IMMEDIATE MEDICAL CARE IF:   Your pain does not go away within 2 hours.  You keep throwing up (vomiting).  Your pain is felt only in portions of the abdomen, such as the right side or the left lower portion of the abdomen.  You pass bloody or black tarry stools. MAKE SURE YOU:  Understand these instructions.   Will watch your condition.   Will get help right away if you are not doing well or get worse.  Document Released: 05/23/2005 Document Revised: 08/18/2013 Document Reviewed: 04/22/2013 [facility] Patient Information  2015 [place], [place]. This information is not intended to replace advice given to you by your health care provider. Make sure you discuss any questions you have with your health care provider.   For your ongoing mental health needs, you are advised to follow up with [facility].  New and returning patients are seen at their walk-in clinic.  Walk-in hours are Monday - Friday from 8:00 am - 3:00 pm.  Walk-in patients are seen on a first come, first served basis.  Try to arrive as early as possible for he best chance of being seen the same day:       [facility]      [address]      [place], [place] [address]      [phone]

## 2014-11-05 NOTE — BH Assessment (Signed)
[facility] Assessment Progress Note  Per Thedore Mins, MD this pt does not require psychiatric hospitalization at this time.  He is to be discharged with referral for outpatient treatment.  Referral information for [facility] has been included in his discharge instructions.  Pt's nurse has been notified.  Doylene Canning, MA Triage Specialist 11/05/2014 @ 11:16

## 2014-11-05 NOTE — ED Notes (Signed)
Pt steeped out room after seeing [facility]. Team, and stated "what the f**k is that b**ches name, He needs some Psychiatric help himself" pt instructed by sitter to go back in the room. Pt requesting to speak to the director, is upset about his treatment.

## 2014-11-19 ENCOUNTER — Emergency Department (HOSPITAL_COMMUNITY)
Admission: EM | Admit: 2014-11-19 | Discharge: 2014-11-20 | Disposition: A | Discharge status: Home / Self Care | Payer: Self-pay | Attending: Emergency Medicine | Admitting: Emergency Medicine

## 2014-11-19 ENCOUNTER — Encounter (HOSPITAL_COMMUNITY): Payer: Self-pay | Admitting: Emergency Medicine

## 2014-11-19 DIAGNOSIS — R1110 Vomiting, unspecified: Secondary | ICD-10-CM | POA: Insufficient documentation

## 2014-11-19 DIAGNOSIS — M1990 Unspecified osteoarthritis, unspecified site: Secondary | ICD-10-CM | POA: Insufficient documentation

## 2014-11-19 DIAGNOSIS — R1012 Left upper quadrant pain: Secondary | ICD-10-CM | POA: Insufficient documentation

## 2014-11-19 DIAGNOSIS — G8929 Other chronic pain: Secondary | ICD-10-CM | POA: Insufficient documentation

## 2014-11-19 DIAGNOSIS — R109 Unspecified abdominal pain: Secondary | ICD-10-CM

## 2014-11-19 DIAGNOSIS — R45851 Suicidal ideations: Secondary | ICD-10-CM | POA: Insufficient documentation

## 2014-11-19 DIAGNOSIS — Z79899 Other long term (current) drug therapy: Secondary | ICD-10-CM | POA: Insufficient documentation

## 2014-11-19 DIAGNOSIS — Z7982 Long term (current) use of aspirin: Secondary | ICD-10-CM | POA: Insufficient documentation

## 2014-11-19 DIAGNOSIS — Z008 Encounter for other general examination: Secondary | ICD-10-CM

## 2014-11-19 DIAGNOSIS — R1013 Epigastric pain: Secondary | ICD-10-CM | POA: Insufficient documentation

## 2014-11-19 DIAGNOSIS — Z720 Tobacco use: Secondary | ICD-10-CM | POA: Insufficient documentation

## 2014-11-19 LAB — CBC WITH DIFFERENTIAL/PLATELET
Basophils Absolute: 0 10*3/uL (ref 0.0–0.1)
Basophils Relative: 0 % (ref 0–1)
EOS PCT: 3 % (ref 0–5)
Eosinophils Absolute: 0.3 10*3/uL (ref 0.0–0.7)
HCT: 47 % (ref 39.0–52.0)
HEMOGLOBIN: 15.5 g/dL (ref 13.0–17.0)
LYMPHS ABS: 3.7 10*3/uL (ref 0.7–4.0)
Lymphocytes Relative: 45 % (ref 12–46)
MCH: 31.4 pg (ref 26.0–34.0)
MCHC: 33 g/dL (ref 30.0–36.0)
MCV: 95.3 fL (ref 78.0–100.0)
MONO ABS: 1 10*3/uL (ref 0.1–1.0)
Monocytes Relative: 12 % (ref 3–12)
Neutro Abs: 3.3 10*3/uL (ref 1.7–7.7)
Neutrophils Relative %: 40 % — ABNORMAL LOW (ref 43–77)
Platelets: 241 10*3/uL (ref 150–400)
RBC: 4.93 MIL/uL (ref 4.22–5.81)
RDW: 13.7 % (ref 11.5–15.5)
WBC: 8.2 10*3/uL (ref 4.0–10.5)

## 2014-11-19 LAB — COMPREHENSIVE METABOLIC PANEL
ALK PHOS: 72 U/L (ref 39–117)
ALT: 11 U/L (ref 0–53)
AST: 20 U/L (ref 0–37)
Albumin: 4.2 g/dL (ref 3.5–5.2)
Anion gap: 8 (ref 5–15)
BUN: 14 mg/dL (ref 6–23)
CO2: 29 mmol/L (ref 19–32)
Calcium: 9.1 mg/dL (ref 8.4–10.5)
Chloride: 104 mmol/L (ref 96–112)
Creatinine, Ser: 1.04 mg/dL (ref 0.50–1.35)
GFR calc Af Amer: >90 mL/min (ref >90)
GFR calc non Af Amer: >90 mL/min (ref >90)
Glucose, Bld: 105 mg/dL — ABNORMAL HIGH (ref 70–99)
Potassium: 4 mmol/L (ref 3.5–5.1)
Sodium: 141 mmol/L (ref 135–145)
TOTAL PROTEIN: 7 g/dL (ref 6.0–8.3)
Total Bilirubin: 0.5 mg/dL (ref 0.3–1.2)

## 2014-11-19 LAB — LIPASE, BLOOD: Lipase: 32 U/L (ref 11–59)

## 2014-11-19 LAB — ACETAMINOPHEN LEVEL: Acetaminophen (Tylenol), Serum: <10 ug/mL — ABNORMAL LOW (ref 10–30)

## 2014-11-19 LAB — SALICYLATE LEVEL: Salicylate Lvl: <4 mg/dL (ref 2.8–20.0)

## 2014-11-19 LAB — ETHANOL: Alcohol, Ethyl (B): <5 mg/dL (ref 0–9)

## 2014-11-19 NOTE — ED Notes (Signed)
Patient arrives with [employer] from [place]. Patient is here for Medical Clearance, has been accepted at [facility] pending medical clearance. Patient is IVC. Patient c/o ongoing abdominal pain that has been worked up multiple times in the past.

## 2014-11-19 NOTE — ED Provider Notes (Signed)
CSN: [id number]     Arrival date & time 11/19/14  2134 History   None    Chief Complaint  Patient presents with  . Medical Clearance    SI, from [facility], needs Med Clear and can return to [facility], patient is IVC, c/o ongoing abd pain  . Abdominal Pain   Patient is a 29 y.o. male presenting with abdominal pain. The history is provided by the patient. No language interpreter was used.  Abdominal Pain Associated symptoms: vomiting    This chart was scribed for non-physician practitioner Antony Madura, PA-C, working with Tomasita Crumble, MD, by Andrew Au, ED Scribe. This patient was seen in room WTR3/WLPT3 and the patient's care was started at 12:41 AM.  Steven Barker is a 29 y.o. male who presents to the Emergency Department for abdominal pain for 1 year. Pt states he received a CT scan here in august 2015 and was diagnosed with a fractured left  rib which he believes was causing the abdominal pain. He now presents with daily upper center abdomen that travels to left back. He reports intermittent episodes of emesis, last episode being yesterday. Pt report last BM was 1 day ago, which was normal. Pt states pain was improving when taking 4 tylenol and 2 aspirin but no longer has relief with medication. He reports worsening pain with movement and sitting. He denies abdominal surgery. Pt states he binge drinks but denies drinking effecting abdominal pain. Pt reports family hx of ulcers.    Past Medical History  Diagnosis Date  . Anxiety   . Depression   . Rheumatoid arthritis(714.0)    History reviewed. No pertinent past surgical history. No family history on file. History  Substance Use Topics  . Smoking status: Current Every Day Smoker -- 0.50 packs/day for 10 years    Types: Cigarettes  . Smokeless tobacco: Not on file  . Alcohol Use: Yes     Comment: occ    Review of Systems  Gastrointestinal: Positive for vomiting and abdominal pain.  All other systems reviewed and are  negative.   Allergies  Clindamycin/lincomycin  Home Medications   Prior to Admission medications   Medication Sig Start Date End Date Taking? Authorizing Provider  acetaminophen (TYLENOL) 325 MG tablet Take 1,300 mg by mouth every 6 (six) hours as needed for moderate pain.   Yes Historical Provider, MD  aspirin 325 MG tablet Take 650 mg by mouth daily.   Yes Historical Provider, MD  amoxicillin (AMOXIL) 500 MG capsule Take 1 capsule (500 mg total) by mouth 3 (three) times daily. Patient not taking: Reported on 11/19/2014 06/15/14   Jaynie Crumble, PA-C  HYDROcodone-acetaminophen (NORCO/VICODIN) 5-325 MG per tablet Take 1 tablet by mouth every 6 (six) hours as needed for moderate pain. Patient not taking: Reported on 11/19/2014 04/12/14   Elwin Mocha, MD  ibuprofen (ADVIL,MOTRIN) 800 MG tablet Take 1 tablet (800 mg total) by mouth 3 (three) times daily. Patient not taking: Reported on 11/19/2014 06/15/14   Tatyana Kirichenko, PA-C  ondansetron (ZOFRAN ODT) 4 MG disintegrating tablet Take 1 tablet (4 mg total) by mouth every 8 (eight) hours as needed for vomiting. Patient not taking: Reported on 11/19/2014 11/04/14   Felicie Morn, NP  pantoprazole (PROTONIX) 20 MG tablet Take 1 tablet (20 mg total) by mouth daily. 11/20/14   Antony Madura, PA-C  sucralfate (CARAFATE) 1 GM/10ML suspension Take 10 mLs (1 g total) by mouth 4 (four) times daily -  with meals and at bedtime. 11/20/14  Antony Madura, PA-C  traMADol (ULTRAM) 50 MG tablet Take 1 tablet (50 mg total) by mouth every 6 (six) hours as needed. Patient not taking: Reported on 11/19/2014 11/04/14   Felicie Morn, NP   BP 106/55 mmHg  Pulse 67  Temp(Src) 98.1 F (36.7 C) (Oral)  Resp 16  Ht 5\' 7"  (1.702 m)  Wt 132 lb 4.8 oz (60.011 kg)  BMI 20.72 kg/m2  SpO2 99%   Physical Exam  Constitutional: He is oriented to person, place, and time. He appears well-developed and well-nourished. No distress.  Nontoxic/nonseptic appearing  HENT:  Head:  Normocephalic and atraumatic.  Eyes: Conjunctivae and EOM are normal. No scleral icterus.  Neck: Normal range of motion.  Cardiovascular: Normal rate, regular rhythm and intact distal pulses.   Pulmonary/Chest: Effort normal and breath sounds normal. No respiratory distress. He has no wheezes. He has no rales.  Lungs CTAB  Abdominal: He exhibits no distension. There is tenderness. There is no rebound and no guarding.  Soft abdomen with LUQ and epigastric TTP. No masses or peritoneal signs.  Musculoskeletal: Normal range of motion.  Neurological: He is alert and oriented to person, place, and time. He exhibits normal muscle tone. Coordination normal.  Skin: Skin is warm and dry. No rash noted. He is not diaphoretic. No erythema. No pallor.  Psychiatric: He has a normal mood and affect. His behavior is normal. He expresses suicidal ideation.  Nursing note and vitals reviewed.   ED Course  Procedures (including critical care time) DIAGNOSTIC STUDIES: Oxygen Saturation is 99% on RA, normal by my interpretation.    COORDINATION OF CARE: 12:41 AM- Pt advised of plan for treatment and pt agrees.  Labs Review Labs Reviewed  CBC WITH DIFFERENTIAL/PLATELET - Abnormal; Notable for the following:    Neutrophils Relative % 40 (*)    All other components within normal limits  COMPREHENSIVE METABOLIC PANEL - Abnormal; Notable for the following:    Glucose, Bld 105 (*)    All other components within normal limits  URINALYSIS, ROUTINE W REFLEX MICROSCOPIC - Abnormal; Notable for the following:    Specific Gravity, Urine 1.035 (*)    All other components within normal limits  ACETAMINOPHEN LEVEL - Abnormal; Notable for the following:    Acetaminophen (Tylenol), Serum <10.0 (*)    All other components within normal limits  URINE RAPID DRUG SCREEN (HOSP PERFORMED) - Abnormal; Notable for the following:    Cocaine POSITIVE (*)    Benzodiazepines POSITIVE (*)    Tetrahydrocannabinol POSITIVE (*)     All other components within normal limits  LIPASE, BLOOD  ETHANOL  SALICYLATE LEVEL    Imaging Review No results found.   EKG Interpretation None      MDM   Final diagnoses:  Chronic abdominal pain  Medical clearance for psychiatric admission    29 year old male presents to the emergency department for medical clearance. He is under IVC and has been accepted at [facility] for psychiatric treatment. Patient to be cleared for abdominal pain. This is chronic and has been ongoing for 1 year. Patient was most recently evaluated for this 2 weeks ago with a negative workup. He also received a CT scan in August 2015 at onset of his symptoms which was unremarkable. Pain is present in the left upper quadrant and epigastrium. Patient has a soft abdomen without masses or peritoneal signs. His laboratory workup is stable compared to prior workups. No leukocytosis to suggest infectious etiology. Stable H/H. Liver and kidney function preserved.  Lipase WNL.  Given location of symptoms and chronicity of pain, doubt emergent abdominal pelvic process. We will treat empirically for gastritis as patient reports a history of binge drinking. Patient medically cleared and appropriate for discharge with prescriptions for Protonix and Carafate. Return precautions provided. Patient discharged in good condition.  I personally performed the services described in this documentation, which was scribed in my presence. The recorded information has been reviewed and is accurate.   Filed Vitals:   11/19/14 2156  BP: 106/55  Pulse: 67  Temp: 98.1 F (36.7 C)  TempSrc: Oral  Resp: 16  Height: 5\' 7"  (1.702 m)  Weight: 132 lb 4.8 oz (60.011 kg)  SpO2: 99%      , PA-C 11/20/14 0045  11/22/14, MD 11/20/14 1410

## 2014-11-20 LAB — URINALYSIS, ROUTINE W REFLEX MICROSCOPIC
Bilirubin Urine: NEGATIVE
Glucose, UA: NEGATIVE mg/dL
Hgb urine dipstick: NEGATIVE
KETONES UR: NEGATIVE mg/dL
LEUKOCYTES UA: NEGATIVE
Nitrite: NEGATIVE
PROTEIN: NEGATIVE mg/dL
Specific Gravity, Urine: 1.035 — ABNORMAL HIGH (ref 1.005–1.030)
UROBILINOGEN UA: 0.2 mg/dL (ref 0.0–1.0)
pH: 5.5 (ref 5.0–8.0)

## 2014-11-20 LAB — RAPID URINE DRUG SCREEN, HOSP PERFORMED
AMPHETAMINES: NOT DETECTED
BARBITURATES: NOT DETECTED
Benzodiazepines: POSITIVE — AB
COCAINE: POSITIVE — AB
OPIATES: NOT DETECTED
Tetrahydrocannabinol: POSITIVE — AB

## 2014-11-20 MED ORDER — PANTOPRAZOLE SODIUM 20 MG PO TBEC: 20.0000 mg | DELAYED_RELEASE_TABLET | Freq: Every day | ORAL | Status: DC

## 2014-11-20 MED ORDER — SUCRALFATE 1 GM/10ML PO SUSP: 1.0000 g | Freq: Three times a day (TID) | ORAL | Status: DC

## 2014-11-20 NOTE — Discharge Instructions (Signed)

## 2014-11-20 NOTE — ED Notes (Signed)
Spoke with Charity fundraiser at [employer]. Paperwork faxed. Awaiting call back from [facility].

## 2014-12-22 ENCOUNTER — Emergency Department (HOSPITAL_COMMUNITY)
Admission: EM | Admit: 2014-12-22 | Discharge: 2014-12-23 | Disposition: A | Discharge status: Home / Self Care | Payer: Self-pay | Attending: Emergency Medicine | Admitting: Emergency Medicine

## 2014-12-22 ENCOUNTER — Encounter (HOSPITAL_COMMUNITY): Payer: Self-pay | Admitting: Emergency Medicine

## 2014-12-22 DIAGNOSIS — Z79899 Other long term (current) drug therapy: Secondary | ICD-10-CM | POA: Insufficient documentation

## 2014-12-22 DIAGNOSIS — Z7982 Long term (current) use of aspirin: Secondary | ICD-10-CM | POA: Insufficient documentation

## 2014-12-22 DIAGNOSIS — F1910 Other psychoactive substance abuse, uncomplicated: Secondary | ICD-10-CM

## 2014-12-22 DIAGNOSIS — Z720 Tobacco use: Secondary | ICD-10-CM | POA: Insufficient documentation

## 2014-12-22 DIAGNOSIS — R45851 Suicidal ideations: Secondary | ICD-10-CM | POA: Insufficient documentation

## 2014-12-22 DIAGNOSIS — M1990 Unspecified osteoarthritis, unspecified site: Secondary | ICD-10-CM | POA: Insufficient documentation

## 2014-12-22 LAB — RAPID URINE DRUG SCREEN, HOSP PERFORMED
AMPHETAMINES: NOT DETECTED
BARBITURATES: NOT DETECTED
BENZODIAZEPINES: NOT DETECTED
Cocaine: POSITIVE — AB
Opiates: POSITIVE — AB
Tetrahydrocannabinol: POSITIVE — AB

## 2014-12-22 LAB — ACETAMINOPHEN LEVEL: Acetaminophen (Tylenol), Serum: <10 ug/mL — ABNORMAL LOW (ref 10–30)

## 2014-12-22 LAB — CBC
HEMATOCRIT: 45.8 % (ref 39.0–52.0)
HEMOGLOBIN: 15.6 g/dL (ref 13.0–17.0)
MCH: 32.1 pg (ref 26.0–34.0)
MCHC: 34.1 g/dL (ref 30.0–36.0)
MCV: 94.2 fL (ref 78.0–100.0)
PLATELETS: 270 10*3/uL (ref 150–400)
RBC: 4.86 MIL/uL (ref 4.22–5.81)
RDW: 13.2 % (ref 11.5–15.5)
WBC: 11.8 10*3/uL — ABNORMAL HIGH (ref 4.0–10.5)

## 2014-12-22 LAB — COMPREHENSIVE METABOLIC PANEL
ALBUMIN: 4.6 g/dL (ref 3.5–5.2)
ALK PHOS: 67 U/L (ref 39–117)
ALT: 10 U/L (ref 0–53)
AST: 19 U/L (ref 0–37)
Anion gap: 5 (ref 5–15)
BUN: 14 mg/dL (ref 6–23)
CHLORIDE: 102 mmol/L (ref 96–112)
CO2: 28 mmol/L (ref 19–32)
Calcium: 9.3 mg/dL (ref 8.4–10.5)
Creatinine, Ser: 1.06 mg/dL (ref 0.50–1.35)
GFR calc non Af Amer: >90 mL/min (ref >90)
Glucose, Bld: 122 mg/dL — ABNORMAL HIGH (ref 70–99)
POTASSIUM: 3.7 mmol/L (ref 3.5–5.1)
Sodium: 135 mmol/L (ref 135–145)
TOTAL PROTEIN: 7.3 g/dL (ref 6.0–8.3)
Total Bilirubin: 0.8 mg/dL (ref 0.3–1.2)

## 2014-12-22 LAB — SALICYLATE LEVEL: Salicylate Lvl: <4 mg/dL (ref 2.8–20.0)

## 2014-12-22 LAB — ETHANOL

## 2014-12-22 MED ORDER — NICOTINE 21 MG/24HR TD PT24
21.0000 mg | MEDICATED_PATCH | Freq: Every day | TRANSDERMAL | Status: DC
  Administered 2014-12-22 – 2014-12-23 (×2): 21 mg via TRANSDERMAL
  Filled 2014-12-22 (×2): qty 1

## 2014-12-22 MED ORDER — ACETAMINOPHEN 325 MG PO TABS: 650.0000 mg | ORAL_TABLET | ORAL | Status: DC | PRN

## 2014-12-22 MED ORDER — DIPHENHYDRAMINE HCL 25 MG PO CAPS
25.0000 mg | ORAL_CAPSULE | Freq: Once | ORAL | Status: AC
  Administered 2014-12-22: 25 mg via ORAL
  Filled 2014-12-22: qty 1

## 2014-12-22 NOTE — ED Provider Notes (Signed)
CSN: [id number]     Arrival date & time 12/22/14  1946 History  This chart was scribed for non-physician provider Elpidio Anis, PA-C, working with Nelva Nay, MD by Phillis Haggis, ED Scribe. This patient was seen in room WTR3/WLPT3 and patient care was started at 8:54 PM.   Chief Complaint  Patient presents with  . IVC   . Suicidal   The history is provided by the patient. No language interpreter was used.  HPI Comments: Steven Barker is a 29 y.o. male who presents to the Emergency Department with [employer] complaining of suicidal ideations, under IVC by [facility] staff, onset one day ago. He states that he was standing on a bridge yesterday, but his girlfriend called him to talk him out of it. He has a significant history of depression and SI, suicidal attempt, most recently hospitalized at [facility], discharged 3 weeks ago. He denies overdose, self injury, or abdominal pain. He reports that he is a smoker.   Past Medical History  Diagnosis Date  . Anxiety   . Depression   . Rheumatoid arthritis(714.0)    History reviewed. No pertinent past surgical history. History reviewed. No pertinent family history. History  Substance Use Topics  . Smoking status: Current Every Day Smoker -- 0.50 packs/day for 10 years    Types: Cigarettes  . Smokeless tobacco: Not on file  . Alcohol Use: No     Comment: last used over a month ago    Review of Systems  Constitutional: Negative for fever and chills.  Respiratory: Negative.   Cardiovascular: Negative.   Gastrointestinal: Negative.  Negative for nausea, vomiting and abdominal pain.  Musculoskeletal: Negative.  Negative for myalgias.  Skin: Negative.  Negative for wound.  Neurological: Negative.   Psychiatric/Behavioral: Positive for suicidal ideas. Negative for self-injury.   Allergies  Clindamycin/lincomycin  Home Medications   Prior to Admission medications   Medication Sig Start Date End Date Taking? Authorizing Provider   acetaminophen (TYLENOL) 325 MG tablet Take 1,300 mg by mouth every 6 (six) hours as needed for moderate pain.    Historical Provider, MD  amoxicillin (AMOXIL) 500 MG capsule Take 1 capsule (500 mg total) by mouth 3 (three) times daily. Patient not taking: Reported on 11/19/2014 06/15/14   Jaynie Crumble, PA-C  aspirin 325 MG tablet Take 650 mg by mouth daily.    Historical Provider, MD  HYDROcodone-acetaminophen (NORCO/VICODIN) 5-325 MG per tablet Take 1 tablet by mouth every 6 (six) hours as needed for moderate pain. Patient not taking: Reported on 11/19/2014 04/12/14   Elwin Mocha, MD  ibuprofen (ADVIL,MOTRIN) 800 MG tablet Take 1 tablet (800 mg total) by mouth 3 (three) times daily. Patient not taking: Reported on 11/19/2014 06/15/14   Tatyana Kirichenko, PA-C  ondansetron (ZOFRAN ODT) 4 MG disintegrating tablet Take 1 tablet (4 mg total) by mouth every 8 (eight) hours as needed for vomiting. Patient not taking: Reported on 11/19/2014 11/04/14   Felicie Morn, NP  pantoprazole (PROTONIX) 20 MG tablet Take 1 tablet (20 mg total) by mouth daily. 11/20/14   Antony Madura, PA-C  sucralfate (CARAFATE) 1 GM/10ML suspension Take 10 mLs (1 g total) by mouth 4 (four) times daily -  with meals and at bedtime. 11/20/14   Antony Madura, PA-C  traMADol (ULTRAM) 50 MG tablet Take 1 tablet (50 mg total) by mouth every 6 (six) hours as needed. Patient not taking: Reported on 11/19/2014 11/04/14   Felicie Morn, NP   BP 115/74 mmHg  Pulse 53  Temp(Src) 98 F (36.7 C) (Oral)  Resp 21  SpO2 99%   Physical Exam  Constitutional: He is oriented to person, place, and time. He appears well-developed and well-nourished. No distress.  HENT:  Head: Normocephalic and atraumatic.  Eyes: Conjunctivae and EOM are normal.  Neck: Normal range of motion. Neck supple.  Cardiovascular: Normal rate and regular rhythm.   Pulmonary/Chest: Effort normal and breath sounds normal.  Musculoskeletal: Normal range of motion. He exhibits  no edema.  Neurological: He is alert and oriented to person, place, and time.  Skin: Skin is warm and dry.  Psychiatric: He has a normal mood and affect. His behavior is normal.  Nursing note and vitals reviewed.   ED Course  Procedures (including critical care time) DIAGNOSTIC STUDIES: Oxygen Saturation is 99% on room air, normal by my interpretation.    COORDINATION OF CARE: 8:57 PM-Discussed treatment plan which includes labs with pt at bedside and pt agreed to plan.   Labs Review Labs Reviewed - No data to display  Imaging Review No results found.   EKG Interpretation None      MDM   Final diagnoses:  None    1. Suicidal ideation 2. H/o Depression  Waiting to hear from [facility] as to whether patient can be sent back there after medical clearance. Patient stable. Eating and drinking.   TTS consultation and placement to be done here.  Per TTS consult, patient meets inpatient criteria and is accepted at [facility] pending bed availability.   I personally performed the services described in this documentation, which was scribed in my presence. The recorded information has been reviewed and is accurate.     Elpidio Anis, PA-C 12/23/14 [id number]  Nelva Nay, MD 12/23/14 [phone]

## 2014-12-22 NOTE — BH Assessment (Addendum)
Tele Assessment Note   Steven Barker is an 29 y.o. male who presented to [facility] under IVC for SI with a plan to jump off of a bridge. Pt states that he was actually standing on a bridge yesterday and ready to jump off when his girlfriend talked him down. Pt presents with depressed mood and mood-congruent affect. Thought process is linear and speech is relevant but sometimes difficult to understand due to being slightly slurred and slow. Pt appears drowsy. He acknowledges current SI but denies HI. He denies any current A/VH but says that he has experienced hallucinations on 2 occassions in his life, which he believes was just due to sleep deprivation. Reported depressive sx include hopelessness, lack of appetite, tearfulness, irritability, anger outbursts, racing thoughts, SI, etc. Pt says has not eaten well in recent months and believes he has lost about 10 lbs in the past month or two. Pt endorses substance abuse, including marijuana and a hx of cocaine abuse. Pt states that he has not been compliant with his medications for a while due to finances and did not follow up with [facility] for continued tx as he was told to. Pt reports current stressors of family problems, relationship problems, health problems, and financial problems.  Pt acknowledges a hx of verbal and physical aggression when angry, adding that he punches holes in the wall, and has even banged his head into the wall of occasion when angry/frustrated. He denies any recent aggression or violent behavior. Pt endorses a hx of 6-7 suicide attmpts in the past via hanging. He reports receiving mental health services from [facility] in the past. He was admitted to [facility] in 2014 & 2015 and just discharged from [facility] about 3 weeks ago. Family hx is positive for MH and SA problems. No noted abuse towards pt, but pt does report that both of his parents suffered from mental illness, which was difficult for him growing up. UDS is positive for opiates, cocaine, and THC.  BAL is negative.  Per IVC paperwork, taken out by Steven Economy, LCSW, "[name] is a 29 y.o. Caucasian male with a hx of Major Depressive Disorder. [name] was recently discharged from [facility] 3 weeks ago but has not been taking medications (Seroquel, Trazadone, Celexa). [name] states he feels worthless and has a plan to jump off a bridge. He is unable to contract for safety."  Per Steven Sievert, PA, pt meets inpt criteria.  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 Anxiety   . Depression   . Rheumatoid arthritis(714.0)    Axis IV: economic problems, educational problems, housing problems, occupational problems, other psychosocial or environmental problems, problems related to legal system/crime, problems related to social environment, problems with access to health care services and problems with primary support group Axis V: 31-40 impairment in reality testing  Past Medical History:  Past Medical History  Diagnosis Date  . Anxiety   . Depression   . Rheumatoid arthritis(714.0)     History reviewed. No pertinent past surgical history.  Family History: History reviewed. No pertinent family history.  Social History:  reports that he has been smoking Cigarettes.  He has a 5 pack-year smoking history. He does not have any smokeless tobacco history on file. He  reports that he uses illicit drugs (Marijuana). He reports that he does not drink alcohol.  Additional Social History:  Alcohol / Drug Use Pain Medications: See PTA List Prescriptions: See PTA List Over the Counter: See PTA List History of alcohol / drug use?: Yes Longest period of sobriety (when/how long): A few weeks Negative Consequences of Use: Legal, Personal relationships, Work / School Substance #1 Name of Substance 1: THC 1 - Age of First Use:  12 1 - Amount (size/oz): 3 blunts 1 - Frequency: Weekly 1 - Duration: 15+ years 1 - Last Use / Amount: Today Substance #2 Name of Substance 2: Etoh 2 - Age of First Use: 21 2 - Amount (size/oz): 12 beers 2 - Frequency: On Weekends 2 - Duration: 8 years 2 - Last Use / Amount: Unknown; Pt denies recent use Substance #3 Name of Substance 3: Cocaine 3 - Age of First Use: 23 3 - Amount (size/oz): Unknown 3 - Frequency: 1-2x per month 3 - Duration: Past 6 years, intermittently 3 - Last Use / Amount: This week  CIWA: CIWA-Ar BP: 115/74 mmHg Pulse Rate: (!) 53 COWS:    PATIENT STRENGTHS: (choose at least two) Ability for insight Average or above average intelligence Communication skills General fund of knowledge Physical Health Special hobby/interest Supportive family/friends Work skills  Allergies:  Allergies  Allergen Reactions  . Clindamycin/Lincomycin Nausea And Vomiting    Patient stated that he felt like he was going to die    Home Medications:  (Not in a hospital admission)  OB/GYN Status:  No LMP for male patient.  General Assessment Data Location of Assessment: [facility] ED Is this a Tele or Face-to-Face Assessment?: Face-to-Face Is this an Initial Assessment or a Re-assessment for this encounter?: Initial Assessment Living Arrangements: Parent, Other relatives Can pt return to current living arrangement?: Yes Admission Status: Involuntary Is patient capable of signing voluntary admission?: Yes Transfer from: Home Referral Source: Other Museum/gallery curator)     [facility] Crisis Care Plan Living Arrangements: Parent, Other relatives Name of Psychiatrist: [facility] Name of Therapist: [facility]  Education Status Is patient currently in school?: No Current Grade: na Highest grade of school patient has completed: 8 Name of school: na Contact person: na  Risk to self with the past 6 months Suicidal Ideation: Yes-Currently Present Suicidal Intent: Yes-Currently Present Is  patient at risk for suicide?: Yes Suicidal Plan?: Yes-Currently Present Specify Current Suicidal Plan: Jump off bridge Access to Means: Yes Specify Access to Suicidal Means: Roadways, bridges What has been your use of drugs/alcohol within the last 12 months?: THC, cocaine, and etoh use Previous Attempts/Gestures: Yes How many times?: 7 Other Self Harm Risks: Anger Mgmt Problems when frustrated, bangs head on wall Triggers for Past Attempts: Family contact, Other personal contacts Intentional Self Injurious Behavior: Damaging Comment - Self Injurious Behavior: Hx of attempted hanging, bangs head on wall, etc. Family Suicide History: Unknown Recent stressful life event(s): Conflict (Comment), Financial Problems, Legal Issues Persecutory voices/beliefs?: No Depression: Yes Depression Symptoms: Insomnia, Tearfulness, Isolating, Fatigue, Feeling worthless/self pity, Feeling angry/irritable Substance abuse history and/or treatment for substance abuse?: Yes Suicide prevention information given to non-admitted patients: Not applicable  Risk to Others within the past 6 months Homicidal Ideation: No Thoughts of Harm to Others: No Current Homicidal Intent: No Current Homicidal Plan: No Access to Homicidal Means: No Identified Victim: n/a History of harm to others?: No Assessment of Violence: In past 6-12 months Violent Behavior Description: Verbal aggression, punch holes in walls Does patient have  access to weapons?: No Criminal Charges Pending?: No Describe Pending Criminal Charges: Unknown Does patient have a court date: No  Psychosis Hallucinations: Auditory (Only 2x in his life) Delusions: None noted  Mental Status Report Appearance/Hygiene: In scrubs Eye Contact: Fair Motor Activity: Freedom of movement Speech: Logical/coherent Level of Consciousness: Quiet/awake Mood: Depressed Affect: Depressed Anxiety Level: Minimal Thought Processes: Thought Blocking Judgement:  Impaired Orientation: Person, Place, Time, Situation Obsessive Compulsive Thoughts/Behaviors: None  Cognitive Functioning Concentration: Decreased Memory: Recent Intact IQ: Average Insight: Fair Impulse Control: Poor Appetite: Poor Weight Loss: 10 Weight Gain: 0 Sleep: No Change Total Hours of Sleep: 5 Vegetative Symptoms: Staying in bed  ADLScreening [facility] Assessment Services) Patient's cognitive ability adequate to safely complete daily activities?: Yes Patient able to express need for assistance with ADLs?: Yes Independently performs ADLs?: Yes (appropriate for developmental age)  Prior Inpatient Therapy Prior Inpatient Therapy: No Prior Therapy Dates: 2014-2016 Prior Therapy Facilty/Provider(s): [facility], [facility] Reason for Treatment: SI  Prior Outpatient Therapy Prior Outpatient Therapy: Yes Prior Therapy Dates: Ongoing Prior Therapy Facilty/Provider(s): [facility] Reason for Treatment: Depression  ADL Screening (condition at time of admission) Patient's cognitive ability adequate to safely complete daily activities?: Yes Is the patient deaf or have difficulty hearing?: No Does the patient have difficulty seeing, even when wearing glasses/contacts?: No Does the patient have difficulty concentrating, remembering, or making decisions?: No Patient able to express need for assistance with ADLs?: Yes Does the patient have difficulty dressing or bathing?: No Independently performs ADLs?: Yes (appropriate for developmental age) Does the patient have difficulty walking or climbing stairs?: No Weakness of Legs: None Weakness of Arms/Hands: None  Home Assistive Devices/Equipment Home Assistive Devices/Equipment: None    Abuse/Neglect Assessment (Assessment to be complete while patient is alone) Physical Abuse: Denies Verbal Abuse: Denies Sexual Abuse: Denies Exploitation of patient/patient's resources: Denies Self-Neglect: Denies Values / Beliefs Cultural Requests During  Hospitalization: None Spiritual Requests During Hospitalization: None   Advance Directives (For Healthcare) Does patient have an advance directive?: No Would patient like information on creating an advanced directive?: No - patient declined information    Additional Information 1:1 In Past 12 Months?: No CIRT Risk: No Elopement Risk: No Does patient have medical clearance?: Yes     Disposition: Per Steven Sievert, PA, pt meets inpt criteria. Awaiting placement at [facility].Disposition Initial Assessment Completed for this Encounter: Yes Disposition of Patient: Inpatient treatment program Type of inpatient treatment program: Adult  Cyndie Mull, [facility] Triage Specialist  12/23/2014 12:48 AM

## 2014-12-22 NOTE — ED Notes (Signed)
Elpidio Anis, PA contacted and informed that patient is requesting something for sleep.

## 2014-12-22 NOTE — ED Notes (Signed)
Patient admits to [facility] with a plan to jump off bridge. Patient denies HI and AVH at this time. Patient reports that he was discharge from [facility] three weeks ago and has been non-compliant with his medication. Patient oriented to unit and plan of care discussed. Patient voices no concerns or complaints at this time. Encouragement and support provided and safety maintain. Q 15 min safety checks in place.
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
## 2014-12-23 ENCOUNTER — Encounter (HOSPITAL_COMMUNITY): Payer: Self-pay

## 2014-12-23 ENCOUNTER — Inpatient Hospital Stay (HOSPITAL_COMMUNITY)
Admission: AD | Admit: 2014-12-23 | Discharge: 2014-12-29 | DRG: 885 | Disposition: A | Discharge status: Home / Self Care | Payer: Federal, State, Local not specified - Other | Source: Intra-hospital | Attending: Psychiatry | Admitting: Psychiatry

## 2014-12-23 DIAGNOSIS — F411 Generalized anxiety disorder: Secondary | ICD-10-CM | POA: Diagnosis present

## 2014-12-23 DIAGNOSIS — Z639 Problem related to primary support group, unspecified: Secondary | ICD-10-CM

## 2014-12-23 DIAGNOSIS — F332 Major depressive disorder, recurrent severe without psychotic features: Secondary | ICD-10-CM | POA: Diagnosis present

## 2014-12-23 DIAGNOSIS — F17210 Nicotine dependence, cigarettes, uncomplicated: Secondary | ICD-10-CM | POA: Diagnosis present

## 2014-12-23 DIAGNOSIS — G4700 Insomnia, unspecified: Secondary | ICD-10-CM | POA: Diagnosis present

## 2014-12-23 DIAGNOSIS — M069 Rheumatoid arthritis, unspecified: Secondary | ICD-10-CM | POA: Diagnosis present

## 2014-12-23 DIAGNOSIS — F419 Anxiety disorder, unspecified: Secondary | ICD-10-CM | POA: Diagnosis not present

## 2014-12-23 DIAGNOSIS — F1910 Other psychoactive substance abuse, uncomplicated: Secondary | ICD-10-CM | POA: Diagnosis present

## 2014-12-23 DIAGNOSIS — R45851 Suicidal ideations: Secondary | ICD-10-CM | POA: Diagnosis not present

## 2014-12-23 DIAGNOSIS — Z599 Problem related to housing and economic circumstances, unspecified: Secondary | ICD-10-CM

## 2014-12-23 DIAGNOSIS — F1994 Other psychoactive substance use, unspecified with psychoactive substance-induced mood disorder: Secondary | ICD-10-CM

## 2014-12-23 DIAGNOSIS — Z9114 Patient's other noncompliance with medication regimen: Secondary | ICD-10-CM | POA: Diagnosis present

## 2014-12-23 DIAGNOSIS — F410 Panic disorder [episodic paroxysmal anxiety] without agoraphobia: Secondary | ICD-10-CM | POA: Diagnosis present

## 2014-12-23 DIAGNOSIS — Z23 Encounter for immunization: Secondary | ICD-10-CM | POA: Diagnosis not present

## 2014-12-23 MED ORDER — ALUM & MAG HYDROXIDE-SIMETH 200-200-20 MG/5ML PO SUSP: 30.0000 mL | ORAL | Status: DC | PRN

## 2014-12-23 MED ORDER — QUETIAPINE FUMARATE 100 MG PO TABS
100.0000 mg | ORAL_TABLET | Freq: Every day | ORAL | Status: DC
  Administered 2014-12-24: 100 mg via ORAL
  Filled 2014-12-23 (×3): qty 1

## 2014-12-23 MED ORDER — QUETIAPINE FUMARATE 100 MG PO TABS
100.0000 mg | ORAL_TABLET | Freq: Every day | ORAL | Status: DC
  Administered 2014-12-23: 100 mg via ORAL
  Filled 2014-12-23 (×4): qty 1

## 2014-12-23 MED ORDER — PNEUMOCOCCAL VAC POLYVALENT 25 MCG/0.5ML IJ INJ
0.5000 mL | INJECTION | INTRAMUSCULAR | Status: AC
  Administered 2014-12-26: 0.5 mL via INTRAMUSCULAR

## 2014-12-23 MED ORDER — TRAZODONE HCL 100 MG PO TABS
100.0000 mg | ORAL_TABLET | Freq: Every day | ORAL | Status: DC
  Administered 2014-12-23 – 2014-12-26 (×3): 100 mg via ORAL
  Filled 2014-12-23 (×6): qty 1

## 2014-12-23 MED ORDER — NICOTINE 21 MG/24HR TD PT24
21.0000 mg | MEDICATED_PATCH | Freq: Every day | TRANSDERMAL | Status: DC
  Administered 2014-12-24 – 2014-12-27 (×4): 21 mg via TRANSDERMAL
  Filled 2014-12-23 (×7): qty 1

## 2014-12-23 MED ORDER — MAGNESIUM HYDROXIDE 400 MG/5ML PO SUSP: 30.0000 mL | Freq: Every day | ORAL | Status: DC | PRN

## 2014-12-23 MED ORDER — PANTOPRAZOLE SODIUM 40 MG PO TBEC
40.0000 mg | DELAYED_RELEASE_TABLET | Freq: Every day | ORAL | Status: DC
  Administered 2014-12-23 – 2014-12-29 (×7): 40 mg via ORAL
  Filled 2014-12-23 (×7): qty 1
  Filled 2014-12-23: qty 14
  Filled 2014-12-23 (×2): qty 1

## 2014-12-23 MED ORDER — CITALOPRAM HYDROBROMIDE 20 MG PO TABS
20.0000 mg | ORAL_TABLET | Freq: Every day | ORAL | Status: DC
  Administered 2014-12-23 – 2014-12-29 (×7): 20 mg via ORAL
  Filled 2014-12-23 (×5): qty 1
  Filled 2014-12-23: qty 14
  Filled 2014-12-23 (×4): qty 1

## 2014-12-23 MED ORDER — OLANZAPINE 5 MG PO TBDP
5.0000 mg | ORAL_TABLET | Freq: Two times a day (BID) | ORAL | Status: DC | PRN
  Administered 2014-12-23 – 2014-12-28 (×9): 5 mg via ORAL
  Filled 2014-12-23 (×4): qty 1
  Filled 2014-12-23: qty 10
  Filled 2014-12-23 (×5): qty 1

## 2014-12-23 MED ORDER — ACETAMINOPHEN 325 MG PO TABS
650.0000 mg | ORAL_TABLET | Freq: Four times a day (QID) | ORAL | Status: DC | PRN
  Administered 2014-12-23 – 2014-12-28 (×6): 650 mg via ORAL
  Filled 2014-12-23 (×6): qty 2

## 2014-12-23 MED ORDER — SUCRALFATE 1 GM/10ML PO SUSP
1.0000 g | Freq: Three times a day (TID) | ORAL | Status: DC
Start: 2014-12-23 — End: 2014-12-29
  Administered 2014-12-23 – 2014-12-29 (×24): 1 g via ORAL
  Filled 2014-12-23 (×33): qty 10

## 2014-12-23 NOTE — ED Notes (Signed)
Patient resting quietly in his room watching TV. Respirations equal and unlabored, skin warm and dry, NAD. No change in assessment or acuity. Q 15 minute safety checks remain in place.
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
## 2014-12-23 NOTE — Tx Team (Addendum)
Initial Interdisciplinary Treatment Plan   PATIENT STRESSORS: Financial difficulties Health problems Legal issue Marital or family conflict Occupational concerns Substance abuse   PATIENT STRENGTHS: Ability for insight Average or above average intelligence Communication skills Supportive family/friends   PROBLEM LIST: Problem List/Patient Goals Date to be addressed Date deferred Reason deferred Estimated date of resolution  "increase my support system" - depression 12/23/2014  12/23/2014    "get me on the right meds" - worsening depression/anxuiety 12/23/2014 12/23/2014    "maybe go to rehab" - substance abuse 12/23/2014 12/23/2014                                         DISCHARGE CRITERIA:  Adequate post-discharge living arrangements Improved stabilization in mood, thinking, and/or behavior Motivation to continue treatment in a less acute level of care Verbal commitment to aftercare and medication compliance  PRELIMINARY DISCHARGE PLAN: Attend aftercare/continuing care group Attend 12-step recovery group Participate in family therapy  PATIENT/FAMIILY INVOLVEMENT: This treatment plan has been presented to and reviewed with the patient, Steven Barker .  The patient and family have been given the opportunity to ask questions and make suggestions.  Aurora Mask 12/23/2014, 12:52 PM

## 2014-12-23 NOTE — ED Notes (Signed)
At approximately [phone] patient met with Dr. Darleene Cleaver to discuss his treatment.  Patient states, "You let me out of here last time and I was still suicidal and I reported you!"  He continues to be verbally aggressive asking for a different doctor.  Dr. Loni Muse attempted to talk to him, but patient would not let him get a word in.  At one point patient was up in MD's face threatening, "You asshole, I'm gonna report you.  I'll kill your ass!"  At this point Dr. Loni Muse concluded the conversation and walked away.  Patient continued to stand in the hallway calling MD several derogatory names.  Patient also accused nurse of lying for MD.  He was attempting to retract his statement and said he did not say those things.  Patient discharged and transferred to [place].  Report called to nurse and [employer] transferred patient. Patient is high acuity at this time.  Informed nurse at [facility] to have [place] standing by when patient arrives.

## 2014-12-23 NOTE — ED Notes (Signed)
Patient asleep.

## 2014-12-23 NOTE — BH Assessment (Signed)
[facility] Assessment Progress Note  Per Thedore Mins, MD, this pt requires urgent psychiatric hospitalization at this time.  He presents under IVC initiated by [facility] staff, and Dr Jannifer Franklin has upheld the petition.  Thurman Coyer, RN, [facility] has assigned pt to [facility] Rm [id number].  Pt has signed Consent to Release Information, and this form along with IVC paperwork has been faxed to [facility].  Pt's nurse, Rayfield Citizen, has been informed, and she agrees to send original paperwork along with pt via [employer], and to call report to [phone].  Doylene Canning, MA Triage Specialist [phone]
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
## 2014-12-23 NOTE — ED Notes (Signed)
Patient resting quietly in bed with eyes closed, Respirations equal and unlabored, skin warm and dry, NAD. No change in assessment or acuity. Q 15 minute safety checks remain in place.   

## 2014-12-23 NOTE — BHH Suicide Risk Assessment (Signed)
[facility] Admission Suicide Risk Assessment   Nursing information obtained from:    Demographic factors:    Current Mental Status:    Loss Factors:    Historical Factors:    Risk Reduction Factors:    Total Time spent with patient: 45 minutes Principal Problem: MDD (major depressive disorder), recurrent episode, severe Diagnosis:   Patient Active Problem List   Diagnosis Date Noted  . Substance induced mood disorder [F19.94] 11/05/2014  . Suicidal ideation [R45.851] 11/05/2014  . Anxiety state, unspecified [F41.1] 01/01/2014  . 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 Rheumatoid arthritis [M06.9] 05/17/2013  . Polysubstance abuse [F19.10] 05/17/2013     Continued Clinical Symptoms:  Alcohol Use Disorder Identification Test Final Score (AUDIT): 0 The "Alcohol Use Disorders Identification Test", Guidelines for Use in Primary Care, Second Edition.  World Science writer [facility]). Score between 0-7:  no or low risk or alcohol related problems. Score between 8-15:  moderate risk of alcohol related problems. Score between 16-19:  high risk of alcohol related problems. Score 20 or above:  warrants further diagnostic evaluation for alcohol dependence and treatment.   CLINICAL FACTORS:   Depression:   Comorbid alcohol abuse/dependence Impulsivity Alcohol/Substance Abuse/Dependencies  Psychiatric Specialty Exam: Physical Exam  ROS  Blood pressure 127/86, pulse 63, temperature 98.5 F (36.9 C), temperature source Oral, resp. rate 20, height 5' 6.54" (1.69 m), weight 63.957 kg (141 lb), SpO2 99 %.Body mass index is 22.39 kg/(m^2).    COGNITIVE FEATURES THAT CONTRIBUTE TO RISK:  Closed-mindedness, Polarized thinking and Thought constriction (tunnel vision)    SUICIDE RISK:   Moderate:  Frequent suicidal ideation with limited intensity, and duration, some specificity in terms of plans, no associated intent, good self-control, limited  dysphoria/symptomatology, some risk factors present, and identifiable protective factors, including available and accessible social support.  PLAN OF CARE: Supportive approach/coping skills                               Depression/mood instability;resume his medications                               Polysubstance abuse: identify detox needs/work a relapse prevention plan                               CBT/mindfulness  Medical Decision Making:  Review of Psycho-Social Stressors (1), Review or order clinical lab tests (1), Review of Medication Regimen & Side Effects (2) and Review of New Medication or Change in Dosage (2)  I certify that inpatient services furnished can reasonably be expected to improve the patient's condition.   Jylan Loeza A 12/23/2014, 2:41 PM
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
## 2014-12-23 NOTE — BHH Counselor (Signed)
Per [name], PA, pt meets inpt criteria and can be accepted to [facility], pending bed availability.   Per Delorise Jackson, RN, [facility], pt can come to [facility] once pulse is 70 or above. As of 01:18, pulse was 55.   Cyndie Mull, [facility] Triage Specialist
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
## 2014-12-24 LAB — TSH: TSH: 0.483 u[IU]/mL (ref 0.350–4.500)

## 2014-12-24 MED ORDER — QUETIAPINE FUMARATE 50 MG PO TABS
150.0000 mg | ORAL_TABLET | Freq: Every day | ORAL | Status: DC
  Administered 2014-12-25: 150 mg via ORAL
  Filled 2014-12-24 (×3): qty 3

## 2014-12-24 MED ORDER — GABAPENTIN 300 MG PO CAPS
300.0000 mg | ORAL_CAPSULE | Freq: Three times a day (TID) | ORAL | Status: DC
  Administered 2014-12-24 – 2014-12-27 (×9): 300 mg via ORAL
  Filled 2014-12-24 (×12): qty 1

## 2014-12-24 NOTE — BHH Group Notes (Signed)
[facility] LCSW Aftercare Discharge Planning Group Note   12/24/2014 11:35 AM  Participation Quality:  Appropriate   Mood/Affect:  Appropriate  Depression Rating:  5  Anxiety Rating:  7  Thoughts of Suicide:  No Will you contract for safety?   NA  Current AVH:  No  Plan for Discharge/Comments:  Pt reports that he is working on getting his court issues in order so that he can eventually attend [facility]. He plans to follow-up at [facility] for med management and outpatient mental health services. He is requesting an Production assistant, radio. CSW provided. Meds are working well per pt.   Transportation Means: bus or [employer]  Supports: girlfriend   Counselling psychologist, Oncologist

## 2014-12-24 NOTE — Progress Notes (Signed)
D: Patient denies SI/HI and A/V hallucinations; patient reports sleep is fair; reports appetite is fair; reports energy level is normal ; reports ability to concentrate is good; rates depression as 4/10; rates hopelessness 5/10; rates anxiety as 5/10; patient reports some anxiety and agitation  A: Monitored q 15 minutes; patient encouraged to attend groups; patient educated about medications; patient given medications per physician orders; patient encouraged to express feelings and/or concerns  R: Patient is cooperative but does appear to be anxious and agitated ; patient's interaction with staff and peers is appropriate; patient was able to set goal to talk with staff 1:1 when having feelings of SI; patient is taking medications as prescribed and tolerating medications; patient is attending all groups
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
## 2014-12-24 NOTE — Progress Notes (Signed)
[facility] MD Progress Note  12/24/2014 5:29 PM Steven Barker  MRN:  [id number] Subjective:  Steven Barker continues to endorse depression. He will have to go back home as he has pending court dates and he will not be admitted to a residential treatment program  until he takes care of those. States that even if he was to move out with his GF he will always be there to take care of his grandmother as he did his mother. That means that he will have to be dealing with his other  dysfunctional family members. He is still endorsing anxiety persistent worry as well and suicidal ideas,. He is able to contract for safety Principal Problem: MDD (major depressive disorder), recurrent episode, severe Diagnosis:   Patient Active Problem List   Diagnosis Date Noted  . Substance induced mood disorder [F19.94] 11/05/2014  . Suicidal ideation [R45.851] 11/05/2014  . Anxiety state, unspecified [F41.1] 01/01/2014  . 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 Rheumatoid arthritis [M06.9] 05/17/2013  . Polysubstance abuse [F19.10] 05/17/2013   Total Time spent with patient: 30 minutes   Past Medical History:  Past Medical History  Diagnosis Date  . Anxiety   . Depression   . Rheumatoid arthritis(714.0)    History reviewed. No pertinent past surgical history. Family History: History reviewed. No pertinent family history. Social History:  History  Alcohol Use No    Comment: last used over a month ago     History  Drug Use  . Yes  . Special: Marijuana    History   Social History  . Marital Status: Single    Spouse Name: N/A  . Number of Children: N/A  . Years of Education: N/A   Social History Main Topics  . Smoking status: Current Every Day Smoker -- 0.50 packs/day for 10 years    Types: Cigarettes  . Smokeless tobacco: Not on file  . Alcohol Use: No     Comment: last used over a month ago  . Drug Use: Yes    Special: Marijuana  . Sexual Activity: Yes    Birth  Control/ Protection: None   Other Topics Concern  . None   Social History Narrative   Additional History:    Sleep: Fair  Appetite:  Fair   Assessment:   Musculoskeletal: Strength & Muscle Tone: within normal limits Gait & Station: normal Patient leans: N/A   Psychiatric Specialty Exam: Physical Exam  Review of Systems  Constitutional: Positive for malaise/fatigue.  HENT: Negative.   Eyes: Negative.   Respiratory: Negative.   Cardiovascular: Negative.   Gastrointestinal: Negative.   Genitourinary: Negative.   Musculoskeletal: Negative.   Skin: Negative.   Neurological: Negative.   Endo/Heme/Allergies: Negative.   Psychiatric/Behavioral: Positive for depression, suicidal ideas and substance abuse. The patient is nervous/anxious.     Blood pressure 118/72, pulse 73, temperature 97.8 F (36.6 C), temperature source Oral, resp. rate 16, height 5' 6.54" (1.69 m), weight 63.957 kg (141 lb), SpO2 99 %.Body mass index is 22.39 kg/(m^2).  General Appearance: Fairly Groomed  Engineer, water::  Fair  Speech:  Clear and Coherent and not spontaneous  Volume:  Decreased  Mood:  Depressed  Affect:  Restricted  Thought Process:  Coherent and Goal Directed  Orientation:  Full (Time, Place, and Person)  Thought Content:  symptoms events worries concerns  Suicidal Thoughts:  Yes.  without intent/plan on and off can contract for safety  Homicidal Thoughts:  No  Memory:  Immediate;  Fair Recent;   Fair Remote;   Fair  Judgement:  Fair  Insight:  Present and Shallow  Psychomotor Activity:  Restlessness  Concentration:  Fair  Recall:  [employer] of Knowledge:Fair  Language: Fair  Akathisia:  No  Handed:  Right  AIMS (if indicated):     Assets:  Desire for Improvement  ADL's:  Intact  Cognition: WNL  Sleep:  Number of Hours: 5.25     Current Medications: Current Facility-Administered Medications  Medication Dose Route Frequency Provider Last Rate Last Dose  .  acetaminophen (TYLENOL) tablet 650 mg  650 mg Oral Q6H PRN Delfin Gant, NP   650 mg at 12/24/14 0924  . alum & mag hydroxide-simeth (MAALOX/MYLANTA) 200-200-20 MG/5ML suspension 30 mL  30 mL Oral Q4H PRN Delfin Gant, NP      . citalopram (CELEXA) tablet 20 mg  20 mg Oral Daily Nicholaus Bloom, MD   20 mg at 12/24/14 0756  . gabapentin (NEURONTIN) capsule 300 mg  300 mg Oral TID Nicholaus Bloom, MD   300 mg at 12/24/14 1706  . magnesium hydroxide (MILK OF MAGNESIA) suspension 30 mL  30 mL Oral Daily PRN Delfin Gant, NP      . nicotine (NICODERM CQ - dosed in mg/24 hours) patch 21 mg  21 mg Transdermal Daily Nicholaus Bloom, MD   21 mg at 12/24/14 0757  . OLANZapine zydis (ZYPREXA) disintegrating tablet 5 mg  5 mg Oral BID PRN Harriet Butte, NP   5 mg at 12/24/14 0924  . pantoprazole (PROTONIX) EC tablet 40 mg  40 mg Oral Daily Nicholaus Bloom, MD   40 mg at 12/24/14 0756  . pneumococcal 23 valent vaccine (PNU-IMMUNE) injection 0.5 mL  0.5 mL Intramuscular Tomorrow-1000 Nicholaus Bloom, MD      . QUEtiapine (SEROQUEL) tablet 100 mg  100 mg Oral QHS Nicholaus Bloom, MD   100 mg at 12/23/14 2152  . [START ON 12/25/2014] QUEtiapine (SEROQUEL) tablet 150 mg  150 mg Oral Q breakfast Nicholaus Bloom, MD      . sucralfate (CARAFATE) 1 GM/10ML suspension 1 g  1 g Oral TID WC & HS Nicholaus Bloom, MD   1 g at 12/24/14 1706  . traZODone (DESYREL) tablet 100 mg  100 mg Oral QHS Nicholaus Bloom, MD   100 mg at 12/23/14 2152    Lab Results:  Results for orders placed or performed during the hospital encounter of 12/22/14 (from the past 48 hour(s))  Acetaminophen level     Status: Abnormal   Collection Time: 12/22/14  9:13 PM  Result Value Ref Range   Acetaminophen (Tylenol), Serum <10.0 (L) 10 - 30 ug/mL    Comment:        THERAPEUTIC CONCENTRATIONS VARY SIGNIFICANTLY. A RANGE OF 10-30 ug/mL MAY BE AN EFFECTIVE CONCENTRATION FOR MANY PATIENTS. HOWEVER, SOME ARE BEST TREATED AT CONCENTRATIONS OUTSIDE  THIS RANGE. ACETAMINOPHEN CONCENTRATIONS >150 ug/mL AT 4 HOURS AFTER INGESTION AND >50 ug/mL AT 12 HOURS AFTER INGESTION ARE OFTEN ASSOCIATED WITH TOXIC REACTIONS.   CBC     Status: Abnormal   Collection Time: 12/22/14  9:13 PM  Result Value Ref Range   WBC 11.8 (H) 4.0 - 10.5 K/uL   RBC 4.86 4.22 - 5.81 MIL/uL   Hemoglobin 15.6 13.0 - 17.0 g/dL   HCT 45.8 39.0 - 52.0 %   MCV 94.2 78.0 - 100.0 fL   MCH 32.1 26.0 -  34.0 pg   MCHC 34.1 30.0 - 36.0 g/dL   RDW 13.2 11.5 - 15.5 %   Platelets 270 150 - 400 K/uL  Comprehensive metabolic panel     Status: Abnormal   Collection Time: 12/22/14  9:13 PM  Result Value Ref Range   Sodium 135 135 - 145 mmol/L   Potassium 3.7 3.5 - 5.1 mmol/L   Chloride 102 96 - 112 mmol/L   CO2 28 19 - 32 mmol/L   Glucose, Bld 122 (H) 70 - 99 mg/dL   BUN 14 6 - 23 mg/dL   Creatinine, Ser 1.06 0.50 - 1.35 mg/dL   Calcium 9.3 8.4 - 10.5 mg/dL   Total Protein 7.3 6.0 - 8.3 g/dL   Albumin 4.6 3.5 - 5.2 g/dL   AST 19 0 - 37 U/L   ALT 10 0 - 53 U/L   Alkaline Phosphatase 67 39 - 117 U/L   Total Bilirubin 0.8 0.3 - 1.2 mg/dL   GFR calc non Af Amer >90 >90 mL/min   GFR calc Af Amer >90 >90 mL/min    Comment: (NOTE) The eGFR has been calculated using the CKD EPI equation. This calculation has not been validated in all clinical situations. eGFR's persistently <90 mL/min signify possible Chronic Kidney Disease.    Anion gap 5 5 - 15  Ethanol (ETOH)     Status: None   Collection Time: 12/22/14  9:13 PM  Result Value Ref Range   Alcohol, Ethyl (B) <5 0 - 9 mg/dL    Comment:        LOWEST DETECTABLE LIMIT FOR SERUM ALCOHOL IS 11 mg/dL FOR MEDICAL PURPOSES ONLY   Salicylate level     Status: None   Collection Time: 12/22/14  9:13 PM  Result Value Ref Range   Salicylate Lvl <2.2 2.8 - 20.0 mg/dL  Urine Drug Screen     Status: Abnormal   Collection Time: 12/22/14  9:58 PM  Result Value Ref Range   Opiates POSITIVE (A) NONE DETECTED   Cocaine POSITIVE  (A) NONE DETECTED   Benzodiazepines NONE DETECTED NONE DETECTED   Amphetamines NONE DETECTED NONE DETECTED   Tetrahydrocannabinol POSITIVE (A) NONE DETECTED   Barbiturates NONE DETECTED NONE DETECTED    Comment:        DRUG SCREEN FOR MEDICAL PURPOSES ONLY.  IF CONFIRMATION IS NEEDED FOR ANY PURPOSE, NOTIFY LAB WITHIN 5 DAYS.        LOWEST DETECTABLE LIMITS FOR URINE DRUG SCREEN Drug Class       Cutoff (ng/mL) Amphetamine      1000 Barbiturate      200 Benzodiazepine   633 Tricyclics       354 Opiates          300 Cocaine          300 THC              50     Physical Findings: AIMS:  , ,  ,  ,    CIWA:  CIWA-Ar Total: 3 COWS:  COWS Total Score: 3  Treatment Plan Summary: Daily contact with patient to assess and evaluate symptoms and progress in treatment and Medication management Supportive approach/coping skills Polysubstance abuse; continue to assess for detox needs/work a relapse prevention plan Major Depression; will continue the Celexa at 20 mg will consider further increase to 30 mg Mood instability; will continue Seroquel 100 mg in AM will increase the HS dose to 150 mg He was given Zyprexa  for agitation but he was made aware he cant be on both. He would prefer the Seroquel as it has been effective for him in the past Anxiety; will start Neurontin 300 mg TID as it can also help the pain (Rheumatoid arthritis)  Medical Decision Making:  Review of Psycho-Social Stressors (1), Review or order clinical lab tests (1), Review of Medication Regimen & Side Effects (2) and Review of New Medication or Change in Dosage (2)     Sayre Mazor A 12/24/2014, 5:29 PM

## 2014-12-24 NOTE — Plan of Care (Signed)
Problem: Alteration in mood Goal: LTG-Patient reports reduction in suicidal thoughts (Patient reports reduction in suicidal thoughts and is able to verbalize a safety plan for whenever patient is feeling suicidal)  Outcome: Progressing Pt denied SI this shift and verbally contracted for safety.    Problem: Diagnosis: Increased Risk For Suicide Attempt Goal: STG-Patient Will Attend All Groups On The Unit Outcome: Progressing Pt attended evening group on 12/23/14.

## 2014-12-24 NOTE — [facility] Group Notes (Signed)
[facility] LCSW Group Therapy  12/24/2014 3:57 PM  Type of Therapy:  Group Therapy  Participation Level:  Active  Participation Quality:  Attentive  Affect:  Appropriate  Cognitive:  Alert and Oriented  Insight:  Improving  Engagement in Therapy:  Improving  Modes of Intervention:  Confrontation, Discussion, Education, Exploration, Problem-solving, Rapport Building, Socialization and Support  Summary of Progress/Problems: Feelings around Relapse. Group members discussed the meaning of relapse and shared personal stories of relapse, how it affected them and others, and how they perceived themselves during this time. Group members were encouraged to identify triggers, warning signs and coping skills used when facing the possibility of relapse. Social supports were discussed and explored in detail. Post Acute Withdrawal Syndrome (handout provided) was introduced and examined. Pt's were encouraged to ask questions, talk about key points associated with PAWS, and process this information in terms of relapse prevention. Jj was attentive and engaged during today's processing group. He shared that he experienced PAWS in his past periods of sobriety. Marcelo stated that for him, "distractions help. Going to work and keeping busy." Adorian stated that he erased the numbers off of his phone and deleted social media accounts in order to cut contact with drug dealers and negative friends.   Smart, Khalee Mazo LCSWA  12/24/2014, 3:57 PM

## 2014-12-24 NOTE — Progress Notes (Signed)
Recreation Therapy Notes   Date: 04.29.2016 Time: 9:30am  Location: [address] Group Room   Group Topic: Stress Management  Goal Area(s) Addresses:  Patient will actively participate in stress management techniques presented during session.   Behavioral Response: Did not attend.   Marykay Lex Avia Merkley, LRT/CTRS  Tiona Ruane L 12/24/2014 1:07 PM
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
## 2014-12-25 DIAGNOSIS — F332 Major depressive disorder, recurrent severe without psychotic features: Principal | ICD-10-CM

## 2014-12-25 DIAGNOSIS — F1910 Other psychoactive substance abuse, uncomplicated: Secondary | ICD-10-CM

## 2014-12-25 DIAGNOSIS — F419 Anxiety disorder, unspecified: Secondary | ICD-10-CM

## 2014-12-25 DIAGNOSIS — R45851 Suicidal ideations: Secondary | ICD-10-CM

## 2014-12-25 MED ORDER — QUETIAPINE FUMARATE 200 MG PO TABS
200.0000 mg | ORAL_TABLET | Freq: Every day | ORAL | Status: DC
  Administered 2014-12-26: 200 mg via ORAL
  Filled 2014-12-25 (×2): qty 1

## 2014-12-25 NOTE — Progress Notes (Signed)
Patient ID: Steven Barker, male   DOB: [birth year], 29 y.o.   MRN: [id number]  DAR: Pt. Denies SI/HI and A/V Hallucinations. Patient reports sleep is good, appetite is good, energy level is Steven and concentration is good. Patient rates his depression, hopelessness, and anxiety 4/10 for the day. Support and encouragement provided to the patient. Scheduled medications administered to patient per physician's orders. Patient is seen in the milieu and is attending some groups. Patient is inappropriate at times talking about narcotics and benzos. Patient also reported, "I don't want to quit" referring to substances.  Q15 minute checks are maintained for safety.

## 2014-12-25 NOTE — Plan of Care (Signed)
Problem: Alteration in mood & ability to function due to Goal: STG-Patient will comply with prescribed medication regimen (Patient will comply with prescribed medication regimen)  Outcome: Progressing Patient compliant with medication.

## 2014-12-25 NOTE — [facility] Group Notes (Signed)
[facility] Group Notes:  (Clinical Social Work)  12/25/2014     10-11AM  Summary of Progress/Problems:   The main focus of today's process group was to learn how to use a decisional balance exercise to move forward in the Stages of Change, which were described and discussed.  Motivational Interviewing and a worksheet were utilized to help patients explore in depth the perceived benefits and costs of unhealthy coping techniques, as well as the  benefits and costs of replacing that with a healthy coping skills.   The patient expressed that their unhealthy coping involves drinking and smoking marijuana.    Type of Therapy:  Group Therapy - Process   Participation Level:  Active  Participation Quality:  Attentive  Affect:  Blunted  Cognitive:  Alert  Insight:  Developing/Improving  Engagement in Therapy:  Developing/Improving  Modes of Intervention:  Education, Motivational Interviewing  Ambrose Mantle, LCSW 12/25/2014, 2:31 PM

## 2014-12-25 NOTE — Progress Notes (Signed)
Patient ID: Steven Barker, male   DOB: [birth year], 28 y.o.   MRN: [id number] [facility] MD Progress Note  12/25/2014 3:59 PM Steven Barker  MRN:  [id number] Subjective:  Steven Barker seen face-to-face for the psychiatric follow-up evaluation today. Patient continues to endorse depression and reportedly feeling a little better because his appetite is coming back and slept 10 hours last night and contract for safety during this evaluation.  He will have to go back home as he has pending court dates and he will not be admitted to a residential treatment program  until he takes care of those. States that even if he was to move out with his GF he will always be there to take care of his grandmother as he did his mother. That means that he will have to be dealing with his other  dysfunctional family members. He is still endorsing anxiety persistent worry as well.  Principal Problem: MDD (major depressive disorder), recurrent episode, severe Diagnosis:   Patient Active Problem List   Diagnosis Date Noted  . Substance induced mood disorder [F19.94] 11/05/2014  . Suicidal ideation [R45.851] 11/05/2014  . Anxiety state, unspecified [F41.1] 01/01/2014  . 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 Rheumatoid arthritis [M06.9] 05/17/2013  . Polysubstance abuse [F19.10] 05/17/2013   Total Time spent with patient: 30 minutes   Past Medical History:  Past Medical History  Diagnosis Date  . Anxiety   . Depression   . Rheumatoid arthritis(714.0)    History reviewed. No pertinent past surgical history. Family History: History reviewed. No pertinent family history. Social History:  History  Alcohol Use No    Comment: last used over a month ago     History  Drug Use  . Yes  . Special: Marijuana    History   Social History  . Marital Status: Single    Spouse Name: N/A  . Number of Children: N/A  . Years of Education: N/A   Social History Main Topics  . Smoking  status: Current Every Day Smoker -- 0.50 packs/day for 10 years    Types: Cigarettes  . Smokeless tobacco: Not on file  . Alcohol Use: No     Comment: last used over a month ago  . Drug Use: Yes    Special: Marijuana  . Sexual Activity: Yes    Birth Control/ Protection: None   Other Topics Concern  . None   Social History Narrative   Additional History:    Sleep: Fair  Appetite:  Fair   Assessment:   Musculoskeletal: Strength & Muscle Tone: within normal limits Gait & Station: normal Patient leans: N/A   Psychiatric Specialty Exam: Physical Exam  ROS  Blood pressure 118/75, pulse 79, temperature 98 F (36.7 C), temperature source Oral, resp. rate 20, height 5' 6.54" (1.69 m), weight 63.957 kg (141 lb), SpO2 99 %.Body mass index is 22.39 kg/(m^2).  General Appearance: Fairly Groomed  Patent attorney::  Fair  Speech:  Clear and Coherent and not spontaneous  Volume:  Decreased  Mood:  Depressed  Affect:  Restricted  Thought Process:  Coherent and Goal Directed  Orientation:  Full (Time, Place, and Person)  Thought Content:  symptoms events worries concerns  Suicidal Thoughts:  Yes.  without intent/plan patient can contract for safety  Homicidal Thoughts:  No  Memory:  Immediate;   Fair Recent;   Fair Remote;   Fair  Judgement:  Fair  Insight:  Present and Shallow  Psychomotor Activity:  Restlessness  Concentration:  Fair  Recall:  [employer] of Knowledge:Fair  Language: Fair  Akathisia:  No  Handed:  Right  AIMS (if indicated):     Assets:  Desire for Improvement  ADL's:  Intact  Cognition: WNL  Sleep:  Number of Hours: 6.5     Current Medications: Current Facility-Administered Medications  Medication Dose Route Frequency Provider Last Rate Last Dose  . acetaminophen (TYLENOL) tablet 650 mg  650 mg Oral Q6H PRN Earney Navy, NP   650 mg at 12/25/14 6195  . alum & mag hydroxide-simeth (MAALOX/MYLANTA) 200-200-20 MG/5ML suspension 30 mL  30 mL Oral  Q4H PRN Earney Navy, NP      . citalopram (CELEXA) tablet 20 mg  20 mg Oral Daily Rachael Fee, MD   20 mg at 12/25/14 0803  . gabapentin (NEURONTIN) capsule 300 mg  300 mg Oral TID Rachael Fee, MD   300 mg at 12/25/14 1152  . magnesium hydroxide (MILK OF MAGNESIA) suspension 30 mL  30 mL Oral Daily PRN Earney Navy, NP      . nicotine (NICODERM CQ - dosed in mg/24 hours) patch 21 mg  21 mg Transdermal Daily Rachael Fee, MD   21 mg at 12/25/14 0804  . OLANZapine zydis (ZYPREXA) disintegrating tablet 5 mg  5 mg Oral BID PRN Worthy Flank, NP   5 mg at 12/24/14 0924  . pantoprazole (PROTONIX) EC tablet 40 mg  40 mg Oral Daily Rachael Fee, MD   40 mg at 12/25/14 0803  . pneumococcal 23 valent vaccine (PNU-IMMUNE) injection 0.5 mL  0.5 mL Intramuscular Tomorrow-1000 Rachael Fee, MD      . QUEtiapine (SEROQUEL) tablet 100 mg  100 mg Oral QHS Rachael Fee, MD   100 mg at 12/23/14 2152  . QUEtiapine (SEROQUEL) tablet 150 mg  150 mg Oral Q breakfast Rachael Fee, MD   150 mg at 12/25/14 0804  . sucralfate (CARAFATE) 1 GM/10ML suspension 1 g  1 g Oral TID WC & HS Rachael Fee, MD   1 g at 12/25/14 1152  . traZODone (DESYREL) tablet 100 mg  100 mg Oral QHS Rachael Fee, MD   100 mg at 12/23/14 2152    Lab Results:  Results for orders placed or performed during the hospital encounter of 12/23/14 (from the past 48 hour(s))  TSH     Status: None   Collection Time: 12/24/14  7:48 PM  Result Value Ref Range   TSH 0.483 0.350 - 4.500 uIU/mL    Comment: Performed at [facility]    Physical Findings: AIMS:  , ,  ,  ,    CIWA:  CIWA-Ar Total: 3 COWS:  COWS Total Score: 3  Treatment Plan Summary: Daily contact with patient to assess and evaluate symptoms and progress in treatment and Medication management Supportive approach/coping skills Polysubstance abuse; continue to assess for detox needs/work a relapse prevention plan Major Depression; will continue  the Celexa at 20 mg will consider further increase to 30 mg if needed Mood instability; will change Seroquel 200 mg at bedtime as patient requested He was given Zyprexa for agitation but he was made aware he cant be on both.  He would prefer the Seroquel at nighttime only Anxiety; will continue Neurontin 300 mg TID as it can also help the pain (Rheumatoid arthritis)  Medical Decision Making:  Review of Psycho-Social Stressors (1), Review or order clinical lab  tests (1), Review of Medication Regimen & Side Effects (2) and Review of New Medication or Change in Dosage (2)     Lottie Sigman,JANARDHAHA R. 12/25/2014, 3:59 PM

## 2014-12-25 NOTE — Progress Notes (Signed)
Patient ID: Steven Barker, male   DOB: June 14, 1986, 29 y.o.   MRN: [id number] D)  Has been sleeping most of the shift, was up briefly for VS, didn't attend group.  Sleeping at this time, eyes closed, resp reg, unlabored, no c/o's voiced. A)  Will continue to monitor for safety, continue POC R)  Safety maintained.

## 2014-12-25 NOTE — [facility] Group Notes (Signed)
Adult Psychoeducational Group Note  Date:  12/25/2014 Time:  10:48 AM  Group Topic/Focus:  Healthy Coping skills  Participation Level:  Active  Participation Quality:  Appropriate  Affect:  Appropriate  Cognitive:  Appropriate  Insight: Appropriate  Engagement in Group:  Engaged  Modes of Intervention:  Discussion  Additional Comments:    Barton Fanny 12/25/2014, 10:48 AM

## 2014-12-26 MED ORDER — QUETIAPINE FUMARATE 50 MG PO TABS
150.0000 mg | ORAL_TABLET | Freq: Every day | ORAL | Status: DC
  Administered 2014-12-26: 150 mg via ORAL
  Filled 2014-12-26 (×2): qty 3

## 2014-12-26 MED ORDER — QUETIAPINE FUMARATE 100 MG PO TABS
100.0000 mg | ORAL_TABLET | Freq: Every day | ORAL | Status: DC
  Administered 2014-12-27 – 2014-12-29 (×3): 100 mg via ORAL
  Filled 2014-12-26 (×5): qty 1

## 2014-12-26 NOTE — Progress Notes (Signed)
Patient ID: Steven Barker, male   DOB: 12-08-1985, 29 y.o.   MRN: [id number] D)  Has been out and about on the hall this evening, interacting appropriately with peers, but has had issues with staff earlier today.  Has been evasive but did come to the med window for hs meds, was upset that seroquel has been moved to days, wanted it at night.  Encouraged him to discuss with MD tomorrow, offered zyprexa, which he accepted as he was becoming agitated. A)  Will continue to monitor for safety, continue POC R)  Safety maintained.

## 2014-12-26 NOTE — Plan of Care (Signed)
Problem: Consults Goal: Depression Patient Education See Patient Education Module for education specifics.  Outcome: Completed/Met Date Met:  12/26/14 Nurse discussed depression/coping skills with patient.

## 2014-12-26 NOTE — Progress Notes (Signed)
Pt attended AA group with guest speaker @ 20:00 
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
## 2014-12-26 NOTE — [facility] Group Notes (Signed)
The focus of this group is to educate the patient on the purpose and policies of crisis stabilization and provide a format to answer questions about their admission.  The group details unit policies and expectations of patients while admitted.  Patient attended 0900 nurse education orientation group this morning.  Patient actively participated, appropriate affect, alert, appropriate insight and engagement.  Today patient will work on 3 goals for discharge.  

## 2014-12-26 NOTE — Progress Notes (Signed)
D:  Patient's self inventory sheet, patient has poor sleep, sleep medication given.  Fair appetite, normal energy level, poor concentration.  Rated depression 3, denied hopeless, anxiety #5.  Denied withdrawals.  Withdrawals, agitation, irritability.  Denied SI.  Denied pain.  Pain #6, knees and back.  Goal is to talk to MD about medications.  Fix medications, needs seroquel, morning and night.  No discharge plans. A:  Medications administered per MD orders.  Emotional support and encouragements given patient. R:  Denied SI and HI, contracts for safety.  Denied A/V hallucinations.  Safety maintained with 15 minute checks.

## 2014-12-26 NOTE — Progress Notes (Signed)
Patient ID: Steven Barker, male   DOB: 1986-04-28, 29 y.o.   MRN: [id number] Patient ID: Steven Barker, male   DOB: August 19, 1986, 29 y.o.   MRN: [id number] [facility] MD Progress Note  12/26/2014 1:38 PM Kaid Seeberger  MRN:  [id number]   Subjective:  Amalia Hailey complain he has not been getting the medication Seroquel as it was planned which making him upset but denies being acting out. Patient was able to talk to staff nurse and requested to address his medication changes appropriately needed or planned. Patient requested he wants Seroquel 100 mg daily morning and 150 mg at bedtime. Patient continues to endorse depression anxiety and mood swings. Patient reportedly did not sleep well even though taken medication trazodone 100 mg last night. Patient is able to actively participate in therapeutic milieu and therapeutic activities as the planned. Patient has no disturbance of his appetite and contract for safety during this evaluation.  He will have to go back home as he has pending court dates and he will not be admitted to a residential treatment program  until he takes care of those. States that even if he was to move out with his GF he will always be there to take care of his grandmother as he did his mother. That means that he will have to be dealing with his other  dysfunctional family members. He is still endorsing anxiety persistent worry as well.  Principal Problem: MDD (major depressive disorder), recurrent episode, severe Diagnosis:   Patient Active Problem List   Diagnosis Date Noted  . Substance induced mood disorder [F19.94] 11/05/2014  . Suicidal ideation [R45.851] 11/05/2014  . Anxiety state, unspecified [F41.1] 01/01/2014  . 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 Rheumatoid arthritis [M06.9] 05/17/2013  . Polysubstance abuse [F19.10] 05/17/2013   Total Time spent with patient: 20 minutes   Past Medical History:  Past Medical History  Diagnosis Date  .  Anxiety   . Depression   . Rheumatoid arthritis(714.0)    History reviewed. No pertinent past surgical history. Family History: History reviewed. No pertinent family history. Social History:  History  Alcohol Use No    Comment: last used over a month ago     History  Drug Use  . Yes  . Special: Marijuana    History   Social History  . Marital Status: Single    Spouse Name: N/A  . Number of Children: N/A  . Years of Education: N/A   Social History Main Topics  . Smoking status: Current Every Day Smoker -- 0.50 packs/day for 10 years    Types: Cigarettes  . Smokeless tobacco: Not on file  . Alcohol Use: No     Comment: last used over a month ago  . Drug Use: Yes    Special: Marijuana  . Sexual Activity: Yes    Birth Control/ Protection: None   Other Topics Concern  . None   Social History Narrative   Additional History:    Sleep: Fair  Appetite:  Fair   Assessment:   Musculoskeletal: Strength & Muscle Tone: within normal limits Gait & Station: normal Patient leans: N/A   Psychiatric Specialty Exam: Physical Exam  ROS  Blood pressure 132/77, pulse 68, temperature 97.7 F (36.5 C), temperature source Oral, resp. rate 18, height 5' 6.54" (1.69 m), weight 63.957 kg (141 lb), SpO2 99 %.Body mass index is 22.39 kg/(m^2).  General Appearance: Fairly Groomed  Patent attorney::  Fair  Speech:  Clear and Coherent  and not spontaneous  Volume:  Decreased  Mood:  Depressed and frustrated   Affect:  Restricted  Thought Process:  Coherent and Goal Directed  Orientation:  Full (Time, Place, and Person)  Thought Content:  symptoms events worries concerns  Suicidal Thoughts:  Yes.  without intent/plan patient can contract for safety  Homicidal Thoughts:  No  Memory:  Immediate;   Fair Recent;   Fair Remote;   Fair  Judgement:  Fair  Insight:  Present and Shallow  Psychomotor Activity:  Restlessness  Concentration:  Fair  Recall:  [employer] of Knowledge:Fair   Language: Fair  Akathisia:  No  Handed:  Right  AIMS (if indicated):     Assets:  Desire for Improvement  ADL's:  Intact  Cognition: WNL  Sleep:  Number of Hours: 6     Current Medications: Current Facility-Administered Medications  Medication Dose Route Frequency Provider Last Rate Last Dose  . acetaminophen (TYLENOL) tablet 650 mg  650 mg Oral Q6H PRN Earney Navy, NP   650 mg at 12/25/14 8315  . alum & mag hydroxide-simeth (MAALOX/MYLANTA) 200-200-20 MG/5ML suspension 30 mL  30 mL Oral Q4H PRN Earney Navy, NP      . citalopram (CELEXA) tablet 20 mg  20 mg Oral Daily Rachael Fee, MD   20 mg at 12/26/14 1761  . gabapentin (NEURONTIN) capsule 300 mg  300 mg Oral TID Rachael Fee, MD   300 mg at 12/26/14 1145  . magnesium hydroxide (MILK OF MAGNESIA) suspension 30 mL  30 mL Oral Daily PRN Earney Navy, NP      . nicotine (NICODERM CQ - dosed in mg/24 hours) patch 21 mg  21 mg Transdermal Daily Rachael Fee, MD   21 mg at 12/26/14 6073  . OLANZapine zydis (ZYPREXA) disintegrating tablet 5 mg  5 mg Oral BID PRN Worthy Flank, NP   5 mg at 12/26/14 1005  . pantoprazole (PROTONIX) EC tablet 40 mg  40 mg Oral Daily Rachael Fee, MD   40 mg at 12/26/14 7106  . [START ON 12/27/2014] QUEtiapine (SEROQUEL) tablet 100 mg  100 mg Oral Q breakfast Leata Mouse, MD      . QUEtiapine (SEROQUEL) tablet 150 mg  150 mg Oral QHS Leata Mouse, MD      . sucralfate (CARAFATE) 1 GM/10ML suspension 1 g  1 g Oral TID WC & HS Rachael Fee, MD   1 g at 12/26/14 1145  . traZODone (DESYREL) tablet 100 mg  100 mg Oral QHS Rachael Fee, MD   100 mg at 12/25/14 2118    Lab Results:  Results for orders placed or performed during the hospital encounter of 12/23/14 (from the past 48 hour(s))  TSH     Status: None   Collection Time: 12/24/14  7:48 PM  Result Value Ref Range   TSH 0.483 0.350 - 4.500 uIU/mL    Comment: Performed at [facility]     Physical Findings: AIMS: Facial and Oral Movements Muscles of Facial Expression: None, normal Lips and Perioral Area: None, normal Jaw: None, normal Tongue: None, normal,Extremity Movements Upper (arms, wrists, hands, fingers): None, normal Lower (legs, knees, ankles, toes): None, normal, Trunk Movements Neck, shoulders, hips: None, normal, Overall Severity Severity of abnormal movements (highest score from questions above): None, normal Incapacitation due to abnormal movements: None, normal Patient's awareness of abnormal movements (rate only patient's report): No Awareness, Dental Status Current problems  with teeth and/or dentures?: No Does patient usually wear dentures?: No  CIWA:  CIWA-Ar Total: 1 COWS:  COWS Total Score: 1  Treatment Plan Summary: Daily contact with patient to assess and evaluate symptoms and progress in treatment and Medication management Supportive approach/coping skills Polysubstance abuse; continue to assess for detox needs/work a relapse prevention plan Major Depression; will continue the Celexa at 20 mg will consider further increase to 30 mg if needed Mood instability; will change Seroquel 100 mg  daily morning with breakfast and 150 mg daily at bedtime as patient requested He was given Zyprexa for agitation but he was made aware he cant be on both.  Anxiety; will continue Neurontin 300 mg TID as it can also help the pain (Rheumatoid arthritis)  Medical Decision Making:  Review of Psycho-Social Stressors (1), Review or order clinical lab tests (1), Review of Medication Regimen & Side Effects (2) and Review of New Medication or Change in Dosage (2)     Nickalus Thornsberry,JANARDHAHA R. 12/26/2014, 1:38 PM

## 2014-12-27 LAB — HEMOGLOBIN A1C
HEMOGLOBIN A1C: 5.5 % (ref 4.8–5.6)
Mean Plasma Glucose: 111 mg/dL

## 2014-12-27 MED ORDER — QUETIAPINE FUMARATE 200 MG PO TABS
200.0000 mg | ORAL_TABLET | Freq: Every day | ORAL | Status: DC
  Administered 2014-12-27 – 2014-12-28 (×2): 200 mg via ORAL
  Filled 2014-12-27 (×4): qty 1

## 2014-12-27 MED ORDER — GABAPENTIN 400 MG PO CAPS
400.0000 mg | ORAL_CAPSULE | Freq: Three times a day (TID) | ORAL | Status: DC
  Administered 2014-12-27 – 2014-12-29 (×7): 400 mg via ORAL
  Filled 2014-12-27: qty 42
  Filled 2014-12-27 (×5): qty 1
  Filled 2014-12-27: qty 42
  Filled 2014-12-27 (×3): qty 1
  Filled 2014-12-27: qty 42
  Filled 2014-12-27: qty 1

## 2014-12-27 MED ORDER — TRAZODONE HCL 100 MG PO TABS: 200.0000 mg | ORAL_TABLET | Freq: Every day | ORAL | Status: DC

## 2014-12-27 MED ORDER — TRAZODONE HCL 100 MG PO TABS
100.0000 mg | ORAL_TABLET | Freq: Every day | ORAL | Status: DC
  Administered 2014-12-27 – 2014-12-28 (×2): 100 mg via ORAL
  Filled 2014-12-27 (×4): qty 1

## 2014-12-27 NOTE — [facility] Group Notes (Signed)
[facility] LCSW Aftercare Discharge Planning Group Note   12/27/2014 9:34 AM  Participation Quality:  Appropriate   Mood/Affect:  Appropriate  Depression Rating: 2  Anxiety Rating:  8  Thoughts of Suicide:  No Will you contract for safety?   NA  Current AVH:  No  Plan for Discharge/Comments:  Pt reports that he is anxious about going back to his home but knows that he needs to take care of his court dates before going to [facility] or [facility]. Pt plans to follow-up at [facility] and wants to work on coping skills to avoid relapse when he returns home. Pt reports good sleep.   Transportation Means: bus or [employer]   Supports: gf/limited family supports   Counselling psychologist, Oncologist

## 2014-12-27 NOTE — Progress Notes (Signed)
Recreation Therapy Notes  Date: 05.02.2016 Time: 9:30am Location: [address] Group Room   Group Topic: Stress Management  Goal Area(s) Addresses:  Patient will actively participate in stress management techniques presented during session.   Behavioral Response: Did not attend.     Daily Doe L Shaya Altamura, LRT/CTRS  Vahe Pienta L 12/27/2014 4:58 PM 

## 2014-12-27 NOTE — Progress Notes (Addendum)
D: Pt is currently denying any SI/HI/AVH. Pt is visible and active within the milieu. Pt endorses some agitation this evening. Pt appeared calm and relaxed to Clinical research associate. Pt presents anxious in affect and mood this evening. Pt is compliant with his current POC.  A: Writer administered scheduled and prn medications to pt, per MD orders. Continued support and availability as needed was extended to this pt. Staff continue to monitor pt with q41min checks.  R: No adverse drug reactions noted. Pt receptive to treatment. Pt remains safe at this time.

## 2014-12-27 NOTE — Plan of Care (Signed)
Problem: Consults Goal: Suicide Risk Patient Education (See Patient Education module for education specifics)  Outcome: Completed/Met Date Met:  12/27/14 Nurse discussed suicidal thoughts/coping skills with patient.
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
## 2014-12-27 NOTE — Progress Notes (Signed)
Pt reports he is doing better, but still c/o anxiety.  He has been utilizing his prn Zyprexa.  He denies SI/HI/AVH.  He c/o back pain and was given Tylenol as requested.  Pt states he probably return home at discharge.  Pt makes his needs known to staff.  Pt is glad that he will be receiving his Seroquel tonight.  Support and encouragement offered.  Safety maintained with q15 minute checks.

## 2014-12-27 NOTE — Progress Notes (Signed)
D:  Patient's self inventory sheet, patient sleeps fair, sleep medication is helpful.  Good appetite, normal energy level, good concentration.  Denied depression, hopeless and anxiety.  Denied withdrawals.  Denied SI.  Physical problems R.A.  Worst pain in past 24 hours #4.  Goal is to talk to MD and SW about discharge.  No discharge plans. A:  Medications administered per MD orders.  Emotional support and encouragement given patient. R:  Denied SI and HI, contracts for safety.  Denied A/V hallucinations.  Safety maintained with 15 minute checks.

## 2014-12-28 MED ORDER — NICOTINE POLACRILEX 2 MG MT GUM
2.0000 mg | CHEWING_GUM | OROMUCOSAL | Status: DC | PRN
  Administered 2014-12-28: 2 mg via ORAL
  Filled 2014-12-28: qty 1

## 2014-12-28 MED ORDER — NICOTINE 21 MG/24HR TD PT24
21.0000 mg | MEDICATED_PATCH | Freq: Every day | TRANSDERMAL | Status: DC
  Administered 2014-12-28 – 2014-12-29 (×2): 21 mg via TRANSDERMAL
  Filled 2014-12-28 (×5): qty 1

## 2014-12-28 NOTE — [facility] Group Notes (Signed)
[facility] LCSW Group Therapy  12/28/2014 1:25 PM  Type of Therapy:  Group Therapy  Participation Level:  Did Not Attend-Invited. Pt chose to remain in bed.   Summary of Progress/Problems: MHA Speaker came to talk about his personal journey with substance abuse and addiction. The pt processed ways by which to relate to the speaker. MHA speaker provided handouts and educational information pertaining to groups and services offered by the [facility].   Smart, Anairis Knick LCSWA 12/28/2014, 1:25 PM

## 2014-12-28 NOTE — [facility] Group Notes (Signed)
Adult Psychoeducational Group Note  Date:  12/28/2014 Time:  0900 am  Group Topic/Focus:  Orientation:   The focus of this group is to educate the patient on the purpose and policies of crisis stabilization and provide a format to answer questions about their admission.  The group details unit policies and expectations of patients while admitted.  Participation Level:  Minimal  Participation Quality:  Appropriate  Affect:  Appropriate  Cognitive:  Alert  Insight: Appropriate  Engagement in Group:  Engaged  Modes of Intervention:  Discussion, Education and Orientation  Additional Comments:  Pt was present and attentive during group this am, states that he will be going home tomorrow.  Alfonse Spruce 12/28/2014, 9:11 AM

## 2014-12-28 NOTE — Progress Notes (Signed)
Pt reports he is doing ok, but is anxious about his discharge tomorrow.  He has decided to return home at discharge, and will follow up at [facility].  He says he has a friend that he can stay with if it should become a problem.  He says his girlfriend is a good support.  Pt says he is determined to stay clean.  Pt denies SI/HI/AV.  He makes his needs known to staff.  Support and encouragement offered.  Safety maintained with q15 minute checks.

## 2014-12-28 NOTE — Progress Notes (Signed)
D:  Per pt self inventory pt reports sleeping good, appetite good, energy level normal, ability to pay attention good, rates depression at a 1 out of 10, hopelessness at a 1 out of 10, anxiety at an 8 out of 10, denies SI/HI/AVH, pt has had many frequent requests from staff this am.      A:  Emotional support provided, Encouraged pt to continue with treatment plan and attend all group activities, q15 min checks maintained for safety.  R:  Pt is receptive, going to groups, states that he does not know his plan once he gets discharged tomorrow, states that "I guess I will just go home."
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
## 2014-12-29 ENCOUNTER — Encounter (HOSPITAL_COMMUNITY): Payer: Self-pay | Admitting: Registered Nurse

## 2014-12-29 DIAGNOSIS — F332 Major depressive disorder, recurrent severe without psychotic features: Secondary | ICD-10-CM | POA: Insufficient documentation

## 2014-12-29 MED ORDER — PANTOPRAZOLE SODIUM 20 MG PO TBEC: 20.0000 mg | DELAYED_RELEASE_TABLET | Freq: Every day | ORAL | Status: DC

## 2014-12-29 MED ORDER — SUCRALFATE 1 GM/10ML PO SUSP: 1.0000 g | Freq: Three times a day (TID) | ORAL | Status: DC

## 2014-12-29 MED ORDER — QUETIAPINE FUMARATE 100 MG PO TABS: ORAL_TABLET | ORAL | Status: DC

## 2014-12-29 MED ORDER — GABAPENTIN 400 MG PO CAPS: 400.0000 mg | ORAL_CAPSULE | Freq: Three times a day (TID) | ORAL | Status: DC

## 2014-12-29 MED ORDER — TRAZODONE HCL 100 MG PO TABS: 100.0000 mg | ORAL_TABLET | Freq: Every day | ORAL | Status: DC

## 2014-12-29 MED ORDER — OLANZAPINE 5 MG PO TBDP: 5.0000 mg | ORAL_TABLET | Freq: Two times a day (BID) | ORAL | Status: DC | PRN

## 2014-12-29 MED ORDER — PANTOPRAZOLE SODIUM 20 MG PO TBEC
20.0000 mg | DELAYED_RELEASE_TABLET | Freq: Every day | ORAL | Status: DC
  Filled 2014-12-29 (×2): qty 14

## 2014-12-29 MED ORDER — CITALOPRAM HYDROBROMIDE 20 MG PO TABS: 20.0000 mg | ORAL_TABLET | Freq: Every day | ORAL | Status: DC

## 2014-12-29 NOTE — [facility] Suicide Risk Assessment (Signed)
[facility] INPATIENT:  Family/Significant Other Suicide Prevention Education  Suicide Prevention Education:  Contact Attempts: Lolly Mustache (pt's gf) [phone] has been identified by the patient as the family member/significant other with whom the patient will be residing, and identified as the person(s) who will aid the patient in the event of a mental health crisis.  With written consent from the patient, two attempts were made to provide suicide prevention education, prior to and/or following the patient's discharge.  We were unsuccessful in providing suicide prevention education.  A suicide education pamphlet was given to the patient to share with family/significant other.  Date and time of first attempt: 12/28/14 at 3:15PM unable to leave vm.  Date and time of second attempt: 12/29/14 at 8:25AM unable to leave [address], Stanley Helmuth LCSWA  12/29/2014, 8:25 AM

## 2014-12-29 NOTE — Progress Notes (Signed)
Recreation Therapy Notes  Date: 05.04.2016 Time: 9:30am Location: [address] Dayroom   Group Topic: Stress Management  Goal Area(s) Addresses:  Patient will actively participate in stress management techniques presented during session.   Behavioral Response: Did not attend.   Janila Arrazola L Quenisha Lovins, LRT/CTRS  Aspin Palomarez L 12/29/2014 12:44 PM 
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
## 2014-12-29 NOTE — Progress Notes (Signed)
Patient ID: Steven Barker, male   DOB: 1986/06/04, 29 y.o.   MRN: [id number]   Pt discharged home with 12 CAB. Pt was stable at the time of discharge. All papers and prescriptions were given and valuables returned. Verbal understanding expressed. Denies SI/HI and A/VH. Pt given opportunity to express concerns and ask questions. Pt expressed interest in attending support groups and following up with [facility] post discharge.

## 2014-12-29 NOTE — [facility]HH Suicide Risk Assessment (Signed)
[facility] Discharge Suicide Risk Assessment   Demographic Factors:  Male, Adolescent or young adult and Caucasian  Total Time spent with patient: 30 minutes  Musculoskeletal: Strength & Muscle Tone: within normal limits Gait & Station: normal Patient leans: N/A  Psychiatric Specialty Exam: Physical Exam  Review of Systems  Constitutional: Negative.   HENT: Negative.   Eyes: Negative.   Respiratory: Negative.   Cardiovascular: Negative.   Gastrointestinal: Negative.   Genitourinary: Negative.   Musculoskeletal: Negative.   Skin: Negative.   Neurological: Negative.   Endo/Heme/Allergies: Negative.   Psychiatric/Behavioral: Positive for substance abuse. The patient is nervous/anxious.     Blood pressure 121/82, pulse 74, temperature 98.1 F (36.7 C), temperature source Oral, resp. rate 18, height 5' 6.54" (1.69 m), weight 63.957 kg (141 lb), SpO2 99 %.Body mass index is 22.39 kg/(m^2).  General Appearance: Fairly Groomed  Patent attorney::  Fair  Speech:  Clear and Coherent409  Volume:  Normal  Mood:  Euthymic  Affect:  Appropriate  Thought Process:  Coherent and Goal Directed  Orientation:  Full (Time, Place, and Person)  Thought Content:  plans as he moves on  Suicidal Thoughts:  No  Homicidal Thoughts:  No  Memory:  Immediate;   Fair Recent;   Fair Remote;   Fair  Judgement:  Fair  Insight:  Present  Psychomotor Activity:  Normal  Concentration:  Fair  Recall:  [employer] of Knowledge:Fair  Language: Fair  Akathisia:  No  Handed:  Right  AIMS (if indicated):     Assets:  Desire for Improvement Housing Social Support  Sleep:  Number of Hours: 6  Cognition: WNL  ADL's:  Intact   Have you used any form of tobacco in the last 30 days? (Cigarettes, Smokeless Tobacco, Cigars, and/or Pipes): Yes  Has this patient used any form of tobacco in the last 30 days? (Cigarettes, Smokeless Tobacco, Cigars, and/or Pipes) Yes, A prescription for an FDA-approved tobacco cessation  medication was offered at discharge and the patient refused  Mental Status Per Nursing Assessment::   On Admission:     Current Mental Status by Physician: In full contact with reality. There are no active S/S of withdrawal. There are no active SI plans or intent. He states he is ready to go home. He is committed to continue to abstain and to take his medications. He has worked a relapse Astronomer. He is going to pursue counseling   Loss Factors: Legal issues  Historical Factors: Impulsivity  Risk Reduction Factors:   Sense of responsibility to family, Living with another person, especially a relative and Positive social support  Continued Clinical Symptoms:  Depression:   Comorbid alcohol abuse/dependence Alcohol/Substance Abuse/Dependencies  Cognitive Features That Contribute To Risk:  Closed-mindedness, Polarized thinking and Thought constriction (tunnel vision)    Suicide Risk:  Minimal: No identifiable suicidal ideation.  Patients presenting with no risk factors but with morbid ruminations; may be classified as minimal risk based on the severity of the depressive symptoms  Principal Problem: MDD (major depressive disorder), recurrent episode, severe Discharge Diagnoses:  Patient Active Problem List   Diagnosis Date Noted  . Substance induced mood disorder [F19.94] 11/05/2014  . Suicidal ideation [R45.851] 11/05/2014  . Anxiety state, unspecified [F41.1] 01/01/2014  . 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 Rheumatoid arthritis [M06.9] 05/17/2013  . Polysubstance abuse [F19.10] 05/17/2013    Follow-up Information    Follow up with [facility].   Why:  Walk in between  8am-9am Monday through Friday for hospital follow-up/medication management within 3 days of discharge.    Contact information:   [address] [place] [address] Phone: [phone] Fax: [phone]      Plan Of Care/Follow-up recommendations:   Activity:  as tolerated Diet:  regular Follow up [facility] as above Is patient on multiple antipsychotic therapies at discharge:  No   Has Patient had three or more failed trials of antipsychotic monotherapy by history:  No  Recommended Plan for Multiple Antipsychotic Therapies: NA    Koa Zoeller A 12/29/2014, 10:11 AM

## 2014-12-29 NOTE — Progress Notes (Signed)
Patient ID: Steven Barker, male   DOB: 04/29/1986, 29 y.o.   MRN: [id number]  Pt currently presents with a anxious affect and restless behavior. Pt pleasant per pt baseline mood. Per self inventory, pt rates depression at a 2, hopelessness 2 and anxiety 8. Pt's daily goal is to "stay calm" and they intend to do so by "take meds." Pt reports fair sleep, good concentration and a good appetite.   Pt provided with medications per providers orders. Pt's labs and vitals were monitored throughout the day. Pt supported emotionally and encouraged to express concerns and questions. Pt educated on medications and substance abuse.  Pt's safety ensured with 15 minute and environmental checks. Pt currently denies SI/HI and A/V hallucinations. Pt verbally agrees to seek staff if SI/HI or A/VH occurs and to consult with staff before acting on these thoughts. Pt to be discharged today. Pt reports "I'm going to take a cab to my grand-mothers house and then go to the grocery store. I do all my shopping at the first of the month." Pt also reports "I don't know" when asked if returning to family home will be a positive step for him. Pt expresses some interest in attending follow up substance abuse programs.

## 2014-12-29 NOTE — [facility]HH Group Notes (Signed)
[facility] LCSW Aftercare Discharge Planning Group Note   12/29/2014 9:46 AM  Participation Quality:  Appropriate   Mood/Affect:  Appropriate  Depression Rating:  2  Anxiety Rating:  8  Thoughts of Suicide:  No Will you contract for safety?   NA  Current AVH:  No  Plan for Discharge/Comments:  Pt reports that he did not call [facility] yet but plans to do so at home today. Pt to return home and follow-up at [facility] until court dates are resolved. Pt given [facility] and [facility] information, bus pass, and SPE info.   Transportation Means: bus/cab.   Supports: girlfriend   Counselling psychologist, Oncologist

## 2014-12-29 NOTE — Progress Notes (Signed)
Pt attended wrap-up group, as AA/NA did not show up this evening.  Each pt was to identify their goal and explain if goal was reached.  Pt's plan was to talk to MD about possible d/c tomorrow, states that if d/c'd would have to go back to his dads house and does not really want to go back there because it's just a "bad situation".  When asked if he had brought this up to his CM regarding placement stated that he was given a list of places he could call but failed to follow through. States that he wants to finish up his court dates because he could facing prison time.   Tomi Bamberger, MHT

## 2014-12-29 NOTE — Progress Notes (Signed)
  [facility] Adult Case Management Discharge Plan :  Will you be returning to the same living situation after discharge:  Yes,  home. pt may [facility] into [facility] At discharge, do you have transportation home?: Yes,  bus pass Do you have the ability to pay for your medications: Yes,  mental health  Release of information consent forms completed and submitted to medical records by CSW.  Patient to Follow up at: Follow-up Information    Follow up with [facility].   Why:  Walk in between 8am-9am Monday through Friday for hospital follow-up/medication management within 3 days of discharge.    Contact information:   [address], [place] [address] Phone: [phone] Fax: [phone]    Pt given [facility] and [facility] info and was encouraged to follow-up after court dates.   Patient denies SI/HI: Yes,  during group/self report.     Safety Planning and Suicide Prevention discussed: Yes,  SPE completed with pt, contact attempts made with pt's gf. pt provided with SPI pamphlet and mobile crisis information. Pt encouraged to share with his support network, ask questions, and talk about any concerns relating to SPe.  Have you used any form of tobacco in the last 30 days? (Cigarettes, Smokeless Tobacco, Cigars, and/or Pipes): Yes  Has patient been referred to the Quitline?: Patient refused referral  Smart, [name]  12/29/2014, 8:27 AM

## 2014-12-29 NOTE — Progress Notes (Signed)
Recreation Therapy Notes  Animal-Assisted Activity (AAA) Program Checklist/Progress Notes Patient Eligibility Criteria Checklist & Daily Group note for Rec Tx Intervention  Date: [id number] Time: 2:45pm Location: [address]    AAA/T Program Assumption of Risk Form signed by Patient/ or Parent Legal Guardian yes  Patient is free of allergies or sever asthma yes  Patient reports no fear of animals yes  Patient reports no history of cruelty to animals yes  Patient understands his/her participation is voluntary yes  Behavioral Response: Did not attend.   Marykay Lex Dosia Yodice, LRT/CTRS  Azaylea Maves L 12/29/2014 8:40 AM

## 2015-01-02 ENCOUNTER — Encounter (HOSPITAL_COMMUNITY): Payer: Self-pay | Admitting: *Deleted

## 2015-01-02 ENCOUNTER — Emergency Department (HOSPITAL_COMMUNITY)
Admission: EM | Admit: 2015-01-02 | Discharge: 2015-01-10 | Disposition: A | Discharge status: Other Acute Inpatient Hospital | Payer: Federal, State, Local not specified - Other | Attending: Emergency Medicine | Admitting: Emergency Medicine

## 2015-01-02 DIAGNOSIS — Z8739 Personal history of other diseases of the musculoskeletal system and connective tissue: Secondary | ICD-10-CM | POA: Insufficient documentation

## 2015-01-02 DIAGNOSIS — F419 Anxiety disorder, unspecified: Secondary | ICD-10-CM | POA: Insufficient documentation

## 2015-01-02 DIAGNOSIS — Z79899 Other long term (current) drug therapy: Secondary | ICD-10-CM | POA: Insufficient documentation

## 2015-01-02 DIAGNOSIS — F1410 Cocaine abuse, uncomplicated: Secondary | ICD-10-CM | POA: Insufficient documentation

## 2015-01-02 DIAGNOSIS — F332 Major depressive disorder, recurrent severe without psychotic features: Secondary | ICD-10-CM | POA: Diagnosis present

## 2015-01-02 DIAGNOSIS — F1910 Other psychoactive substance abuse, uncomplicated: Secondary | ICD-10-CM | POA: Diagnosis present

## 2015-01-02 DIAGNOSIS — F1210 Cannabis abuse, uncomplicated: Secondary | ICD-10-CM | POA: Insufficient documentation

## 2015-01-02 DIAGNOSIS — F1310 Sedative, hypnotic or anxiolytic abuse, uncomplicated: Secondary | ICD-10-CM | POA: Insufficient documentation

## 2015-01-02 DIAGNOSIS — R45851 Suicidal ideations: Secondary | ICD-10-CM

## 2015-01-02 DIAGNOSIS — F329 Major depressive disorder, single episode, unspecified: Secondary | ICD-10-CM | POA: Insufficient documentation

## 2015-01-02 DIAGNOSIS — Z720 Tobacco use: Secondary | ICD-10-CM | POA: Insufficient documentation

## 2015-01-02 LAB — ACETAMINOPHEN LEVEL

## 2015-01-02 LAB — COMPREHENSIVE METABOLIC PANEL
ALK PHOS: 76 U/L (ref 38–126)
ALT: 31 U/L (ref 17–63)
ANION GAP: 8 (ref 5–15)
AST: 85 U/L — ABNORMAL HIGH (ref 15–41)
Albumin: 4.3 g/dL (ref 3.5–5.0)
BUN: 15 mg/dL (ref 6–20)
CHLORIDE: 106 mmol/L (ref 101–111)
CO2: 24 mmol/L (ref 22–32)
CREATININE: 0.94 mg/dL (ref 0.61–1.24)
Calcium: 9.2 mg/dL (ref 8.9–10.3)
GLUCOSE: 123 mg/dL — AB (ref 70–99)
Potassium: 4.2 mmol/L (ref 3.5–5.1)
Sodium: 138 mmol/L (ref 135–145)
Total Bilirubin: 0.8 mg/dL (ref 0.3–1.2)
Total Protein: 7.7 g/dL (ref 6.5–8.1)

## 2015-01-02 LAB — RAPID URINE DRUG SCREEN, HOSP PERFORMED
AMPHETAMINES: NOT DETECTED
BARBITURATES: NOT DETECTED
BENZODIAZEPINES: POSITIVE — AB
Cocaine: POSITIVE — AB
Opiates: NOT DETECTED
Tetrahydrocannabinol: POSITIVE — AB

## 2015-01-02 LAB — ETHANOL: Alcohol, Ethyl (B): <5 mg/dL (ref <5)

## 2015-01-02 LAB — CBC
HEMATOCRIT: 45.6 % (ref 39.0–52.0)
Hemoglobin: 15.5 g/dL (ref 13.0–17.0)
MCH: 31.6 pg (ref 26.0–34.0)
MCHC: 34 g/dL (ref 30.0–36.0)
MCV: 92.9 fL (ref 78.0–100.0)
Platelets: 224 10*3/uL (ref 150–400)
RBC: 4.91 MIL/uL (ref 4.22–5.81)
RDW: 13.3 % (ref 11.5–15.5)
WBC: 5.9 10*3/uL (ref 4.0–10.5)

## 2015-01-02 LAB — SALICYLATE LEVEL

## 2015-01-02 MED ORDER — GABAPENTIN 400 MG PO CAPS
400.0000 mg | ORAL_CAPSULE | Freq: Three times a day (TID) | ORAL | Status: DC
  Administered 2015-01-02 – 2015-01-10 (×26): 400 mg via ORAL
  Filled 2015-01-02 (×26): qty 1

## 2015-01-02 MED ORDER — CITALOPRAM HYDROBROMIDE 20 MG PO TABS
20.0000 mg | ORAL_TABLET | Freq: Every day | ORAL | Status: DC
Start: 2015-01-03 — End: 2015-01-07
  Administered 2015-01-03 – 2015-01-07 (×5): 20 mg via ORAL
  Filled 2015-01-02 (×7): qty 1

## 2015-01-02 MED ORDER — TRAZODONE HCL 100 MG PO TABS
100.0000 mg | ORAL_TABLET | Freq: Every day | ORAL | Status: DC
  Administered 2015-01-02 – 2015-01-09 (×8): 100 mg via ORAL
  Filled 2015-01-02 (×8): qty 1

## 2015-01-02 MED ORDER — PANTOPRAZOLE SODIUM 20 MG PO TBEC
20.0000 mg | DELAYED_RELEASE_TABLET | Freq: Every day | ORAL | Status: DC
  Administered 2015-01-03 – 2015-01-10 (×8): 20 mg via ORAL
  Filled 2015-01-02 (×9): qty 1

## 2015-01-02 MED ORDER — SUCRALFATE 1 GM/10ML PO SUSP
1.0000 g | Freq: Three times a day (TID) | ORAL | Status: DC
  Administered 2015-01-02 – 2015-01-10 (×33): 1 g via ORAL
  Filled 2015-01-02 (×35): qty 10

## 2015-01-02 MED ORDER — OLANZAPINE 5 MG PO TBDP
5.0000 mg | ORAL_TABLET | Freq: Two times a day (BID) | ORAL | Status: DC | PRN
  Administered 2015-01-03: 5 mg via ORAL
  Filled 2015-01-02: qty 1

## 2015-01-02 NOTE — ED Notes (Signed)
Pt. Noted in room. No complaints or concerns voiced. No distress or abnormal behavior noted. Will continue to monitor with security cameras. Q 15 minute rounds continue. 

## 2015-01-02 NOTE — ED Notes (Signed)
Pt changing into scrubs 

## 2015-01-02 NOTE — ED Notes (Signed)
Pt. Noted sleeping in room. No complaints or concerns voiced. No distress or abnormal behavior noted. Will continue to monitor with security cameras. Q 15 minute rounds continue. 

## 2015-01-02 NOTE — Progress Notes (Signed)
DWI, court date on 01/14/2015

## 2015-01-02 NOTE — BH Assessment (Addendum)
Tele Assessment Note   Steven Barker is an 29 y.o. male that is self-referred to [facility] reporting SI with plan to hang himself.  Pt stated he took "a handful of Xanax" 3 days ago to kill himself, "but it didn't work, I just wound up in a ditch."  Pt continues to endorse SI with plan.  Pt was recently discharged from [facility] on 12/23/14 for similar sx.  Pt denies HI or AVH.  No delusions noted.  Pt stated, "I don't know how to explain it.  I can't put it into words."  Pt reports he has used marijuana and cocaine once 3 days ago when he took the Xanax in an attempt to kill himself.  Pt stated he is taking his meds as prescribed from [facility].  However, pt doesn't feel the medications are working.  Pt cooperative, oriented x 4, had depressed mood and affect, good eye contact, logical/coherent thought processes and normal speech.  Consulted with Nanine Means, NP, who recommended inpatient treatment for the pt.  As there are no beds at [facility] currently per Thurman Coyer, [facility], pt will be referred elsewhere by TTS.  Updated ED and TTS staff.  Axis I: 296.33 Major Depressive Disorder, Recurrent Episode, Severe Axis II: Deferred Axis III:  Past Medical History  Diagnosis Date  . Anxiety   . Depression   . Rheumatoid arthritis(714.0)    Axis IV: economic problems, other psychosocial or environmental problems and problems with primary support group Axis V: 21-30 behavior considerably influenced by delusions or hallucinations OR serious impairment in judgment, communication OR inability to function in almost all areas  Past Medical History:  Past Medical History  Diagnosis Date  . Anxiety   . Depression   . Rheumatoid arthritis(714.0)     History reviewed. No pertinent past surgical history.  Family History: No family history on file.  Social History:  reports that he has been smoking Cigarettes.  He has a 5 pack-year smoking history. He does not have any smokeless tobacco history on file. He reports that he uses  illicit drugs (Marijuana and Cocaine). He reports that he does not drink alcohol.  Additional Social History:  Alcohol / Drug Use Pain Medications: see med list Prescriptions: see med list Over the Counter: see med list History of alcohol / drug use?: Yes Longest period of sobriety (when/how long):  (unknown) Negative Consequences of Use: Financial, Personal relationships, Legal, Work / School Withdrawal Symptoms:  (na-pt denies) Substance #1 Name of Substance 1: THC 1 - Age of First Use: 12 1 - Amount (size/oz): 3 blunts 1 - Frequency: Weekly 1 - Duration: 15+ years 1 - Last Use / Amount: 3 days ago-1 blunt Substance #2 Name of Substance 2: Etoh 2 - Age of First Use: 21 2 - Amount (size/oz): 12 beers 2 - Frequency: "I'll drink a couple of days in a row" 2 - Duration: 8 years 2 - Last Use / Amount: "I've been sober for about a month now" Substance #3 Name of Substance 3: Cocaine 3 - Age of First Use: 22 3 - Amount (size/oz): "small amount" 3 - Frequency: 1-2x per month 3 - Duration: Past 6 years, intermittently 3 - Last Use / Amount: 3 days ago - 1 line  CIWA: CIWA-Ar BP: 112/77 mmHg Pulse Rate: 80 COWS:    PATIENT STRENGTHS: (choose at least two) Average or above average intelligence General fund of knowledge  Allergies:  Allergies  Allergen Reactions  . Clindamycin/Lincomycin Nausea And Vomiting  Patient stated that he felt like he was going to die    Home Medications:  (Not in a hospital admission)  OB/GYN Status:  No LMP for male patient.  General Assessment Data Location of Assessment: [facility] ED Is this a Tele or Face-to-Face Assessment?: Tele Assessment Is this an Initial Assessment or a Re-assessment for this encounter?: Initial Assessment Marital status: Single Maiden name:  (na) Is patient pregnant?: Other (Comment) (na) Pregnancy Status: Other (Comment) (na) Living Arrangements: Parent, Other relatives Can pt return to current living arrangement?:  Yes Admission Status: Voluntary Is patient capable of signing voluntary admission?: Yes Referral Source: Self/Family/Friend Insurance type: None     Crisis Care Plan Living Arrangements: Parent, Other relatives Name of Psychiatrist: Transport planner Name of Therapist: [facility]  Education Status Is patient currently in school?: No Current Grade: na Highest grade of school patient has completed: 8 Name of school: na Contact person: na  Risk to self with the past 6 months Suicidal Ideation: Yes-Currently Present Has patient been a risk to self within the past 6 months prior to admission? : Yes Suicidal Intent: Yes-Currently Present Has patient had any suicidal intent within the past 6 months prior to admission? : Yes Is patient at risk for suicide?: Yes Suicidal Plan?: Yes-Currently Present Has patient had any suicidal plan within the past 6 months prior to admission? : Yes Specify Current Suicidal Plan: plan to hang himself Access to Means: Yes Specify Access to Suicidal Means: can get material to hang self What has been your use of drugs/alcohol within the last 12 months?: pt reports marijuana and cocaine use Previous Attempts/Gestures: Yes How many times?: 7 Other Self Harm Risks: na - pt denies Triggers for Past Attempts: Family contact, Other personal contacts Intentional Self Injurious Behavior: None Family Suicide History: Unknown Recent stressful life event(s): Financial Problems, Legal Issues, Other (Comment) (SI, depression) Persecutory voices/beliefs?: No Depression: Yes Depression Symptoms: Despondent, Insomnia, Loss of interest in usual pleasures, Feeling worthless/self pity, Feeling angry/irritable Substance abuse history and/or treatment for substance abuse?: Yes Suicide prevention information given to non-admitted patients: Not applicable  Risk to Others within the past 6 months Homicidal Ideation: No Does patient have any lifetime risk of violence toward others  beyond the six months prior to admission? : No Thoughts of Harm to Others: No Current Homicidal Intent: No Current Homicidal Plan: No Access to Homicidal Means: No Identified Victim: na - pt denies History of harm to others?: No Assessment of Violence: In past 6-12 months Violent Behavior Description: verbal aggression, punches holes in walls Does patient have access to weapons?: No Criminal Charges Pending?: No Describe Pending Criminal Charges: unknown Does patient have a court date: No Court Date:  (na) Is patient on probation?: Unknown  Psychosis Hallucinations: None noted (reports has had in past) Delusions: None noted  Mental Status Report Appearance/Hygiene: Unremarkable, In scrubs Eye Contact: Good Motor Activity: Freedom of movement, Unremarkable Speech: Logical/coherent Level of Consciousness: Alert Mood: Depressed Affect: Depressed Anxiety Level: Minimal Thought Processes: Coherent, Relevant Judgement: Impaired Orientation: Person, Place, Time, Situation Obsessive Compulsive Thoughts/Behaviors: None  Cognitive Functioning Concentration: Decreased Memory: Recent Intact, Remote Intact IQ: Average Insight: Fair Impulse Control: Poor Appetite: Good Weight Loss: 10 Weight Gain: 0 Sleep: No Change Total Hours of Sleep:  (reports takes meds to sleep through night) Vegetative Symptoms: None  ADLScreening [facility] Assessment Services) Patient's cognitive ability adequate to safely complete daily activities?: Yes Patient able to express need for assistance with ADLs?: Yes Independently performs ADLs?: Yes (  appropriate for developmental age)  Prior Inpatient Therapy Prior Inpatient Therapy: Yes Prior Therapy Dates: 2014-2016 Prior Therapy Facilty/Provider(s): [facility], [facility] Reason for Treatment: SI  Prior Outpatient Therapy Prior Outpatient Therapy: Yes Prior Therapy Dates: Ongoing Prior Therapy Facilty/Provider(s): [facility] Reason for Treatment: Depression Does  patient have an ACCT team?: No Does patient have Intensive In-House Services?  : No Does patient have [facility] services? : No Does patient have P4CC services?: No  ADL Screening (condition at time of admission) Patient's cognitive ability adequate to safely complete daily activities?: Yes Is the patient deaf or have difficulty hearing?: No Does the patient have difficulty seeing, even when wearing glasses/contacts?: No Does the patient have difficulty concentrating, remembering, or making decisions?: No Patient able to express need for assistance with ADLs?: Yes Does the patient have difficulty dressing or bathing?: No Independently performs ADLs?: Yes (appropriate for developmental age) Does the patient have difficulty walking or climbing stairs?: No  Home Assistive Devices/Equipment Home Assistive Devices/Equipment: None    Abuse/Neglect Assessment (Assessment to be complete while patient is alone) Physical Abuse: Denies Verbal Abuse: Denies Sexual Abuse: Denies Exploitation of patient/patient's resources: Denies Self-Neglect: Denies Values / Beliefs Cultural Requests During Hospitalization: None Spiritual Requests During Hospitalization: None Consults Spiritual Care Consult Needed: No Social Work Consult Needed: No Merchant navy officer (For Healthcare) Does patient have an advance directive?: No Would patient like information on creating an advanced directive?: No - patient declined information    Additional Information 1:1 In Past 12 Months?: No CIRT Risk: No Elopement Risk: No Does patient have medical clearance?: Yes     Disposition:  Disposition Initial Assessment Completed for this Encounter: Yes Disposition of Patient: Referred to, Inpatient treatment program Type of inpatient treatment program: Adult  Casimer Lanius, MS, [facility] Therapeutic Triage Specialist [facility]   01/02/2015 1:15 PM

## 2015-01-02 NOTE — ED Provider Notes (Signed)
CSN: [id number]     Arrival date & time 01/02/15  1137 History   First MD Initiated Contact with Patient 01/02/15 1204     Chief Complaint  Patient presents with  . Suicidal  . Medical Clearance     (Consider location/radiation/quality/duration/timing/severity/associated sxs/prior Treatment) HPI Comments: Patient here complaining of suicidal ideations with a suicide attempt 2 days ago by taking Xanax. Denies any command hallucinations. Patient has a long history of major depressive disorder and was recently hospitalized for same. States he has been compliant with his medications. Denies any current ingestion of aspirin or Tylenol. Denies any somatic complaints at this time. Nothing makes the symptoms better or worse.  The history is provided by the patient.    Past Medical History  Diagnosis Date  . Anxiety   . Depression   . Rheumatoid arthritis(714.0)    History reviewed. No pertinent past surgical history. No family history on file. History  Substance Use Topics  . Smoking status: Current Every Day Smoker -- 0.50 packs/day for 10 years    Types: Cigarettes  . Smokeless tobacco: Not on file  . Alcohol Use: No     Comment: last used over a month ago    Review of Systems  All other systems reviewed and are negative.     Allergies  Clindamycin/lincomycin  Home Medications   Prior to Admission medications   Medication Sig Start Date End Date Taking? Authorizing Provider  citalopram (CELEXA) 20 MG tablet Take 1 tablet (20 mg total) by mouth daily. 12/29/14   Shuvon B Rankin, NP  gabapentin (NEURONTIN) 400 MG capsule Take 1 capsule (400 mg total) by mouth 3 (three) times daily. 12/29/14   Shuvon B Rankin, NP  OLANZapine zydis (ZYPREXA) 5 MG disintegrating tablet Take 1 tablet (5 mg total) by mouth 2 (two) times daily as needed (Agitation). 12/29/14   Shuvon B Rankin, NP  pantoprazole (PROTONIX) 20 MG tablet Take 1 tablet (20 mg total) by mouth daily. For GERD 12/29/14   Shuvon B  Rankin, NP  QUEtiapine (SEROQUEL) 100 MG tablet Take 100 mg (1 tablet) at breakfast and 200 mg (2 tablets) at bed time for mood control 12/29/14   Shuvon B Rankin, NP  sucralfate (CARAFATE) 1 GM/10ML suspension Take 10 mLs (1 g total) by mouth 4 (four) times daily -  with meals and at bedtime. For GERD 12/29/14   Shuvon B Rankin, NP  traZODone (DESYREL) 100 MG tablet Take 1 tablet (100 mg total) by mouth at bedtime. For sleep 12/29/14   Shuvon B Rankin, NP   BP 112/77 mmHg  Pulse 80  Temp(Src) 98 F (36.7 C) (Oral)  Resp 17  SpO2 98% Physical Exam  Constitutional: He is oriented to person, place, and time. He appears well-developed and well-nourished.  Non-toxic appearance. No distress.  HENT:  Head: Normocephalic and atraumatic.  Eyes: Conjunctivae, EOM and lids are normal. Pupils are equal, round, and reactive to light.  Neck: Normal range of motion. Neck supple. No tracheal deviation present. No thyroid mass present.  Cardiovascular: Normal rate, regular rhythm and normal heart sounds.  Exam reveals no gallop.   No murmur heard. Pulmonary/Chest: Effort normal and breath sounds normal. No stridor. No respiratory distress. He has no decreased breath sounds. He has no wheezes. He has no rhonchi. He has no rales.  Abdominal: Soft. Normal appearance and bowel sounds are normal. He exhibits no distension. There is no tenderness. There is no rebound and no CVA tenderness.  Musculoskeletal:  Normal range of motion. He exhibits no edema or tenderness.  Neurological: He is alert and oriented to person, place, and time. He has normal strength. No cranial nerve deficit or sensory deficit. GCS eye subscore is 4. GCS verbal subscore is 5. GCS motor subscore is 6.  Skin: Skin is warm and dry. No abrasion and no rash noted.  Psychiatric: His affect is blunt. His speech is delayed. He is slowed. He expresses suicidal ideation. He expresses suicidal plans.  Nursing note and vitals reviewed.   ED Course   Procedures (including critical care time) Labs Review Labs Reviewed  ACETAMINOPHEN LEVEL  CBC  COMPREHENSIVE METABOLIC PANEL  ETHANOL  SALICYLATE LEVEL  URINE RAPID DRUG SCREEN (HOSP PERFORMED)    Imaging Review No results found.   EKG Interpretation None      MDM   Final diagnoses:  None    Patient will be medically cleared and evaluated by behavior health for admission    Lorre Nick, MD 01/02/15 1213

## 2015-01-02 NOTE — ED Notes (Signed)
Pt ambulatory w/o difficulty to room 

## 2015-01-02 NOTE — ED Notes (Signed)
Report received from Lizbeth Bark RN. Pt. Alert and oriented in no distress denies HI, AVH and pain.  Pt. States he still has SI without a plan. Pt. Instructed to come to me with problems or concerns.Will continue to monitor for safety via security cameras and Q 15 minute checks.

## 2015-01-02 NOTE — BH Assessment (Signed)
[facility] Assessment Progress Note   Called to schedule pt's tele assessment with this clinician as well as gathered clinical information on the pt from EDP Allen.  Pt to be seen via tele assessment.  Casimer Lanius, MS, [facility] Therapeutic Triage Specialist [facility]

## 2015-01-02 NOTE — ED Notes (Signed)
"  I am mentally unstable, took a handful of Xanax 2 nights ago trying to kill myself, ended up waking up in ditch" Still wants to kill self. No plan. No HI

## 2015-01-02 NOTE — ED Notes (Signed)
Sleeping, easily aroused

## 2015-01-03 DIAGNOSIS — F1910 Other psychoactive substance abuse, uncomplicated: Secondary | ICD-10-CM | POA: Diagnosis not present

## 2015-01-03 DIAGNOSIS — F332 Major depressive disorder, recurrent severe without psychotic features: Secondary | ICD-10-CM | POA: Diagnosis not present

## 2015-01-03 DIAGNOSIS — R45851 Suicidal ideations: Secondary | ICD-10-CM | POA: Diagnosis not present

## 2015-01-03 MED ORDER — QUETIAPINE FUMARATE 100 MG PO TABS
100.0000 mg | ORAL_TABLET | Freq: Every morning | ORAL | Status: DC
  Administered 2015-01-04 – 2015-01-10 (×7): 100 mg via ORAL
  Filled 2015-01-03 (×7): qty 1

## 2015-01-03 MED ORDER — QUETIAPINE FUMARATE 100 MG PO TABS
200.0000 mg | ORAL_TABLET | Freq: Every day | ORAL | Status: DC
  Administered 2015-01-03 – 2015-01-10 (×8): 200 mg via ORAL
  Filled 2015-01-03 (×8): qty 2

## 2015-01-03 MED ORDER — ACETAMINOPHEN 325 MG PO TABS
650.0000 mg | ORAL_TABLET | Freq: Four times a day (QID) | ORAL | Status: DC | PRN
  Administered 2015-01-03 – 2015-01-10 (×10): 650 mg via ORAL
  Filled 2015-01-03 (×11): qty 2

## 2015-01-03 MED ORDER — NICOTINE 21 MG/24HR TD PT24
21.0000 mg | MEDICATED_PATCH | Freq: Once | TRANSDERMAL | Status: AC
  Administered 2015-01-03: 21 mg via TRANSDERMAL
  Filled 2015-01-03: qty 1

## 2015-01-03 MED ORDER — HYDROXYZINE HCL 25 MG PO TABS
50.0000 mg | ORAL_TABLET | Freq: Four times a day (QID) | ORAL | Status: DC | PRN
  Administered 2015-01-03 – 2015-01-10 (×13): 50 mg via ORAL
  Filled 2015-01-03 (×13): qty 2

## 2015-01-03 NOTE — ED Notes (Signed)
Patient stayed in his room most of the day.  Slept some and has been up this afternoon.  Patient stated that he refused to meet with Dr. Jannifer Franklin.  Dr. Carmelina Dane was called in to assess him.  It was decided that patient would be applied to [facility].  He is aware and is accepting of the plan.  Has been cooperative.

## 2015-01-03 NOTE — ED Notes (Signed)
Pt. Noted sleeping in room. No complaints or concerns voiced. No distress or abnormal behavior noted. Will continue to monitor with security cameras. Q 15 minute rounds continue. 

## 2015-01-03 NOTE — BH Assessment (Addendum)
[facility] Assessment Progress Note  Per Thedore Mins, MD this pt still requires psychiatric hospitalization and he is to be referred to [facility]. At 13:03 La at the [facility] authorized referral, #[id number], from 01/03/2015 - 01/09/2015. Please note that authorization does not mean that pt has been accepted to the facility. At 13:05 I spoke to [place] at [facility], who took demographic information and approved referral. Referral information was then faxed. At 13:52 I called Alla German again; he confirms receipt.  Doylene Canning, MA Triage Specialist [phone]

## 2015-01-03 NOTE — ED Notes (Signed)
Patient appears flat. Reports SI. Denies HI, AVH. Rates anxiety 9/10, depression 10/10. Reports headache, states he is having trouble being still and is biting his nails.  Encouragement offered. Given Tylenol, Vistaril.  Q 15 safety checks continue.

## 2015-01-03 NOTE — BH Assessment (Signed)
[facility] Assessment Progress Note  Connie at [facility] called at 13:58.  Pt has been accepted to wait list.  Doylene Canning, MA Triage Specialist [phone]

## 2015-01-03 NOTE — ED Notes (Signed)
Patient reports that he wants to take Seroquel. Became anxious when informed of scheduled medications and Seroquel was not included. States that this medication helps him with mood and sleep. States that he takes the medication twice a day and wants to know why he hasn't received it. Patient states that he would prefer the Seroquel to Zyprexa.  Encouragement offered. Informed patient that writer would speak with a provider regarding his concern/request.   Q 15 safety checks continue.

## 2015-01-03 NOTE — Consult Note (Signed)
[facility] Face-to-Face Psychiatry Consult   Reason for Consult:  Recurrent major depressive disorder, severe, Polysubstance abuse Referring Physician:  EDP Patient Identification: Steven Barker MRN:  [id number] Principal Diagnosis: Major depressive disorder, recurrent, severe without psychotic features Diagnosis:   Patient Active Problem List   Diagnosis Date Noted  . Major depressive disorder, recurrent, severe without psychotic features [F33.2]   . Substance induced mood disorder [F19.94] 11/05/2014  . Suicidal ideation [R45.851] 11/05/2014  . Anxiety state, unspecified [F41.1] 01/01/2014  . 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 Rheumatoid arthritis [M06.9] 05/17/2013  . Polysubstance abuse [F19.10] 05/17/2013    Total Time spent with patient: 1 hour  Subjective:   Steven Barker is a 29 y.o. male patient admitted with .Recurrent major depressive disorder, severe, Polysubstance abuse  HPI:  Caucasian male, 29 years old well known to the service was evaluated for recurrent major depression.  Patient was discharged from our inpatient last week Thursday for depression and Polysubstance dependence.  Patient reported that he went home took his medications once and the next day he used 12 tablets of Klonopin, and the following day he used  Unknown amount of Xanax.  Patient reported that he woke up and saw himself at a ditch.  He reported that his family did not want to help him when he got home. Patient stated that he has not done any follow up with [facility] as instructed.  Patient reported that after taking Xanax and Klonopin he started talking "crazy" and called the police.  Patient is homeless and stated that he if goes home he will hang himself.  Patient stated that he is ready to stop living  a depressed life and using drugs.  He reported that he also used Cocaine and Marijuana since his discharge on Thursday.  Patient denies HI/AVH, he is unable to  contract for safety and we have accepted him for admission and will be looking for bed at [facility].  HPI Elements:   Location:  Recurrent Major depression, severe without Psychosis, Cocaine use disorder, moderate, Marijuna use disorder, Moderate, Benzo use disorder, severe, . Quality:  severe, suicidal with plans to hang self. Severity:  severe. Timing:  acute. Duration:  Chronic substance dependence, chronic mental illness.. Context:  seeking treatment for depression and substance use.Marland Kitchen  Past Medical History:  Past Medical History  Diagnosis Date  . Anxiety   . Depression   . Rheumatoid arthritis(714.0)    History reviewed. No pertinent past surgical history. Family History: No family history on file. Social History:  History  Alcohol Use No    Comment: last used over a month ago     History  Drug Use  . Yes  . Special: Marijuana, Cocaine    History   Social History  . Marital Status: Single    Spouse Name: N/A  . Number of Children: N/A  . Years of Education: N/A   Social History Main Topics  . Smoking status: Current Every Day Smoker -- 0.50 packs/day for 10 years    Types: Cigarettes  . Smokeless tobacco: Not on file  . Alcohol Use: No     Comment: last used over a month ago  . Drug Use: Yes    Special: Marijuana, Cocaine  . Sexual Activity: Yes    Birth Control/ Protection: None   Other Topics Concern  . None   Social History Narrative   Additional Social History:    Pain Medications: see med list Prescriptions: see med  list Over the Counter: see med list History of alcohol / drug use?: Yes Longest period of sobriety (when/how long):  (unknown) Negative Consequences of Use: Financial, Personal relationships, Legal, Work / School Withdrawal Symptoms:  (na-pt denies) Name of Substance 1: THC 1 - Age of First Use: 12 1 - Amount (size/oz): 3 blunts 1 - Frequency: Weekly 1 - Duration: 15+ years 1 - Last Use / Amount: 3 days ago-1 blunt Name of Substance 2:  Etoh 2 - Age of First Use: 21 2 - Amount (size/oz): 12 beers 2 - Frequency: "I'll drink a couple of days in a row" 2 - Duration: 8 years 2 - Last Use / Amount: "I've been sober for about a month now" Name of Substance 3: Cocaine 3 - Age of First Use: 22 3 - Amount (size/oz): "small amount" 3 - Frequency: 1-2x per month 3 - Duration: Past 6 years, intermittently 3 - Last Use / Amount: 3 days ago - 1 line               Allergies:   Allergies  Allergen Reactions  . Clindamycin/Lincomycin Nausea And Vomiting    Patient stated that he felt like he was going to die    Labs:  Results for orders placed or performed during the hospital encounter of 01/02/15 (from the past 48 hour(s))  Acetaminophen level     Status: Abnormal   Collection Time: 01/02/15 12:00 PM  Result Value Ref Range   Acetaminophen (Tylenol), Serum <10 (L) 10 - 30 ug/mL    Comment:        THERAPEUTIC CONCENTRATIONS VARY SIGNIFICANTLY. A RANGE OF 10-30 ug/mL MAY BE AN EFFECTIVE CONCENTRATION FOR MANY PATIENTS. HOWEVER, SOME ARE BEST TREATED AT CONCENTRATIONS OUTSIDE THIS RANGE. ACETAMINOPHEN CONCENTRATIONS >150 ug/mL AT 4 HOURS AFTER INGESTION AND >50 ug/mL AT 12 HOURS AFTER INGESTION ARE OFTEN ASSOCIATED WITH TOXIC REACTIONS.   CBC     Status: None   Collection Time: 01/02/15 12:00 PM  Result Value Ref Range   WBC 5.9 4.0 - 10.5 K/uL   RBC 4.91 4.22 - 5.81 MIL/uL   Hemoglobin 15.5 13.0 - 17.0 g/dL   HCT 45.6 39.0 - 52.0 %   MCV 92.9 78.0 - 100.0 fL   MCH 31.6 26.0 - 34.0 pg   MCHC 34.0 30.0 - 36.0 g/dL   RDW 13.3 11.5 - 15.5 %   Platelets 224 150 - 400 K/uL  Comprehensive metabolic panel     Status: Abnormal   Collection Time: 01/02/15 12:00 PM  Result Value Ref Range   Sodium 138 135 - 145 mmol/L   Potassium 4.2 3.5 - 5.1 mmol/L   Chloride 106 101 - 111 mmol/L   CO2 24 22 - 32 mmol/L   Glucose, Bld 123 (H) 70 - 99 mg/dL   BUN 15 6 - 20 mg/dL   Creatinine, Ser 0.94 0.61 - 1.24 mg/dL    Calcium 9.2 8.9 - 10.3 mg/dL   Total Protein 7.7 6.5 - 8.1 g/dL   Albumin 4.3 3.5 - 5.0 g/dL   AST 85 (H) 15 - 41 U/L   ALT 31 17 - 63 U/L   Alkaline Phosphatase 76 38 - 126 U/L   Total Bilirubin 0.8 0.3 - 1.2 mg/dL   GFR calc non Af Amer >60 >60 mL/min   GFR calc Af Amer >60 >60 mL/min    Comment: (NOTE) The eGFR has been calculated using the CKD EPI equation. This calculation has not been validated in  all clinical situations. eGFR's persistently <60 mL/min signify possible Chronic Kidney Disease.    Anion gap 8 5 - 15  Ethanol (ETOH)     Status: None   Collection Time: 01/02/15 12:00 PM  Result Value Ref Range   Alcohol, Ethyl (B) <5 <5 mg/dL    Comment:        LOWEST DETECTABLE LIMIT FOR SERUM ALCOHOL IS 11 mg/dL FOR MEDICAL PURPOSES ONLY   Salicylate level     Status: None   Collection Time: 01/02/15 12:00 PM  Result Value Ref Range   Salicylate Lvl <4.0 2.8 - 30.0 mg/dL  Urine Drug Screen     Status: Abnormal   Collection Time: 01/02/15  1:04 PM  Result Value Ref Range   Opiates NONE DETECTED NONE DETECTED   Cocaine POSITIVE (A) NONE DETECTED   Benzodiazepines POSITIVE (A) NONE DETECTED   Amphetamines NONE DETECTED NONE DETECTED   Tetrahydrocannabinol POSITIVE (A) NONE DETECTED   Barbiturates NONE DETECTED NONE DETECTED    Comment:        DRUG SCREEN FOR MEDICAL PURPOSES ONLY.  IF CONFIRMATION IS NEEDED FOR ANY PURPOSE, NOTIFY LAB WITHIN 5 DAYS.        LOWEST DETECTABLE LIMITS FOR URINE DRUG SCREEN Drug Class       Cutoff (ng/mL) Amphetamine      1000 Barbiturate      200 Benzodiazepine   981 Tricyclics       191 Opiates          300 Cocaine          300 THC              50     Vitals: Blood pressure 113/70, pulse 79, temperature 98 F (36.7 C), temperature source Oral, resp. rate 16, SpO2 100 %.  Risk to Self: Suicidal Ideation: Yes-Currently Present Suicidal Intent: Yes-Currently Present Is patient at risk for suicide?: Yes Suicidal Plan?:  Yes-Currently Present Specify Current Suicidal Plan: plan to hang himself Access to Means: Yes Specify Access to Suicidal Means: can get material to hang self What has been your use of drugs/alcohol within the last 12 months?: pt reports marijuana and cocaine use How many times?: 7 Other Self Harm Risks: na - pt denies Triggers for Past Attempts: Family contact, Other personal contacts Intentional Self Injurious Behavior: None Risk to Others: Homicidal Ideation: No Thoughts of Harm to Others: No Current Homicidal Intent: No Current Homicidal Plan: No Access to Homicidal Means: No Identified Victim: na - pt denies History of harm to others?: No Assessment of Violence: In past 6-12 months Violent Behavior Description: verbal aggression, punches holes in walls Does patient have access to weapons?: No Criminal Charges Pending?: No Describe Pending Criminal Charges: unknown Does patient have a court date: No Court Date:  (na) Prior Inpatient Therapy: Prior Inpatient Therapy: Yes Prior Therapy Dates: 2014-2016 Prior Therapy Facilty/Provider(s): [facility] Reason for Treatment: SI Prior Outpatient Therapy: Prior Outpatient Therapy: Yes Prior Therapy Dates: Ongoing Prior Therapy Facilty/Provider(s): [facility] Reason for Treatment: Depression Does patient have an ACCT team?: No Does patient have Intensive In-House Services?  : No Does patient have [facility] services? : No Does patient have P4[facility] services?: No  Current Facility-Administered Medications  Medication Dose Route Frequency Provider Last Rate Last Dose  . citalopram (CELEXA) tablet 20 mg  20 mg Oral Daily Lacretia Leigh, MD   20 mg at 01/03/15 1002  . gabapentin (NEURONTIN) capsule 400 mg  400 mg Oral TID Lacretia Leigh,  MD   400 mg at 01/03/15 1605  . nicotine (NICODERM CQ - dosed in mg/24 hours) patch 21 mg  21 mg Transdermal Once Delfin Gant, NP   21 mg at 01/03/15 1202  . OLANZapine zydis (ZYPREXA) disintegrating tablet 5  mg  5 mg Oral BID PRN Lacretia Leigh, MD      . pantoprazole (PROTONIX) EC tablet 20 mg  20 mg Oral Daily Lacretia Leigh, MD   20 mg at 01/03/15 1002  . sucralfate (CARAFATE) 1 GM/10ML suspension 1 g  1 g Oral TID WC & HS Lacretia Leigh, MD   1 g at 01/03/15 1336  . traZODone (DESYREL) tablet 100 mg  100 mg Oral QHS Lacretia Leigh, MD   100 mg at 01/02/15 2129   Current Outpatient Prescriptions  Medication Sig Dispense Refill  . citalopram (CELEXA) 20 MG tablet Take 1 tablet (20 mg total) by mouth daily. 30 tablet 0  . gabapentin (NEURONTIN) 400 MG capsule Take 1 capsule (400 mg total) by mouth 3 (three) times daily. 90 capsule 0  . OLANZapine zydis (ZYPREXA) 5 MG disintegrating tablet Take 1 tablet (5 mg total) by mouth 2 (two) times daily as needed (Agitation). 30 tablet 0  . pantoprazole (PROTONIX) 20 MG tablet Take 1 tablet (20 mg total) by mouth daily. For GERD 30 tablet 0  . QUEtiapine (SEROQUEL) 100 MG tablet Take 100 mg (1 tablet) at breakfast and 200 mg (2 tablets) at bed time for mood control 90 tablet 0  . sucralfate (CARAFATE) 1 GM/10ML suspension Take 10 mLs (1 g total) by mouth 4 (four) times daily -  with meals and at bedtime. For GERD 420 mL 0  . traZODone (DESYREL) 100 MG tablet Take 1 tablet (100 mg total) by mouth at bedtime. For sleep 30 tablet 0   ROS is negative for 10 systems reviewed Musculoskeletal: Strength & Muscle Tone: within normal limits Gait & Station: normal Patient leans: N/A  Psychiatric Specialty Exam:     Blood pressure 113/70, pulse 79, temperature 98 F (36.7 C), temperature source Oral, resp. rate 16, SpO2 100 %.There is no weight on file to calculate BMI.  General Appearance: Casual and Fairly Groomed  Engineer, water::  Minimal  Speech:  Clear and Coherent and Pressured  Volume:  Increased  Mood:  Angry, Anxious, Depressed and Irritable  Affect:  Congruent, Depressed and Labile  Thought Process:  Coherent, Goal Directed and Intact  Orientation:  Full  (Time, Place, and Person)  Thought Content:  WDL  Suicidal Thoughts:  Yes.  with intent/plan  Homicidal Thoughts:  No  Memory:  Immediate;   Good Recent;   Good Remote;   Good  Judgement:  Poor  Insight:  Shallow  Psychomotor Activity:  Normal  Concentration:  Good  Recall:  NA  Fund of Knowledge:Poor  Language: Good  Akathisia:  NA  Handed:  Right  AIMS (if indicated):     Assets:  Desire for Improvement Housing  ADL's:  Intact  Cognition: WNL  Sleep:      Medical Decision Making: Review of Psycho-Social Stressors (1), Established Problem, Worsening (2), Review of Medication Regimen & Side Effects (2) and Review of New Medication or Change in Dosage (2)  Treatment Plan Summary:  We have referred patient to [facility] and will be waiting while we resume his home medications. Citalopram 20 mg po daily for depression,  Seroquel 100 mg in am and 200 at bed time for mood control,  Gabapentin 400  mg tid for mood and anxiety,  Olanzapine 5 mg twice a day as needed for agitation  Trazodone 100 mg po qhs for sleep.  Plan:  [facility] placement  Disposition: see above  Delfin Gant   PMHNP-BC 01/03/2015 4:06 PM  Steven Barker seen face-to-face psychiatric evaluation today with the physician extender and staff RN as patient requested psychiatrist other than one he met before. Patient continued to suffer with depression, dysphoria, irritability, anxiety, agitation and insomnia and also endorses relapsed on his drug of abuse which is opiates and benzodiazepines. Patient cannot contract for safety at this time and has suicidal ideation and plan of hanging himself if he does not get the help he needed. Formulated treatment plan including medication management and possible referral to long-term psychiatric hospitalization. Reviewed the information documented and agree with the treatment plan.  Steven Barker,JANARDHAHA R. 01/04/2015 2:35 PM

## 2015-01-04 DIAGNOSIS — R45851 Suicidal ideations: Secondary | ICD-10-CM | POA: Diagnosis not present

## 2015-01-04 DIAGNOSIS — F1910 Other psychoactive substance abuse, uncomplicated: Secondary | ICD-10-CM | POA: Diagnosis not present

## 2015-01-04 DIAGNOSIS — F332 Major depressive disorder, recurrent severe without psychotic features: Secondary | ICD-10-CM | POA: Diagnosis not present

## 2015-01-04 MED ORDER — NICOTINE 21 MG/24HR TD PT24
21.0000 mg | MEDICATED_PATCH | Freq: Once | TRANSDERMAL | Status: AC
  Administered 2015-01-04: 21 mg via TRANSDERMAL
  Filled 2015-01-04: qty 1

## 2015-01-04 NOTE — ED Notes (Signed)
C/O anxiety. Vistaril 50mg  requested and received.

## 2015-01-04 NOTE — Consult Note (Signed)
[facility] Face-to-Face Psychiatry Consult   Reason for Consult:  Recurrent major depressive disorder, severe, Polysubstance abuse Referring Physician:  EDP Patient Identification: Steven Barker MRN:  [id number] Principal Diagnosis: Major depressive disorder, recurrent, severe without psychotic features Diagnosis:   Patient Active Problem List   Diagnosis Date Noted  . Major depressive disorder, recurrent, severe without psychotic features [F33.2]     Priority: High  . Substance induced mood disorder [F19.94] 11/05/2014    Priority: High  . Suicidal ideation [R45.851] 11/05/2014    Priority: High  . Polysubstance abuse [F19.10] 05/17/2013    Priority: High  . Anxiety state, unspecified [F41.1] 01/01/2014  . 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 Rheumatoid arthritis [M06.9] 05/17/2013    Total Time spent with patient: 30 minutes  Subjective:   Steven Barker is a 29 y.o. male patient admitted with .Recurrent major depressive disorder, severe, Polysubstance abuse  HPI:  The patient continues to endorse suicidal ideations with a plan to hang himself.  His biggest stressors are being homeless and a DWI court date on 5.20.  Denies homicidal ideations, hallucinations, and withdrawal symptoms.  HPI Elements:   Location:  Recurrent Major depression, severe without Psychosis, Cocaine use disorder, moderate, Marijuna use disorder, Moderate, Benzo use disorder, severe, . Quality:  severe, suicidal with plans to hang self. Severity:  severe. Timing:  acute. Duration:  Chronic substance dependence, chronic mental illness.. Context:  seeking treatment for depression and substance use.Marland Kitchen  Past Medical History:  Past Medical History  Diagnosis Date  . Anxiety   . Depression   . Rheumatoid arthritis(714.0)    History reviewed. No pertinent past surgical history. Family History: No family history on file. Social History:  History  Alcohol Use No   Comment: last used over a month ago     History  Drug Use  . Yes  . Special: Marijuana, Cocaine    History   Social History  . Marital Status: Single    Spouse Name: N/A  . Number of Children: N/A  . Years of Education: N/A   Social History Main Topics  . Smoking status: Current Every Day Smoker -- 0.50 packs/day for 10 years    Types: Cigarettes  . Smokeless tobacco: Not on file  . Alcohol Use: No     Comment: last used over a month ago  . Drug Use: Yes    Special: Marijuana, Cocaine  . Sexual Activity: Yes    Birth Control/ Protection: None   Other Topics Concern  . None   Social History Narrative   Additional Social History:    Pain Medications: see med list Prescriptions: see med list Over the Counter: see med list History of alcohol / drug use?: Yes Longest period of sobriety (when/how long):  (unknown) Negative Consequences of Use: Financial, Personal relationships, Armed forces operational officer, Work / School Withdrawal Symptoms:  (na-pt denies) Name of Substance 1: THC 1 - Age of First Use: 12 1 - Amount (size/oz): 3 blunts 1 - Frequency: Weekly 1 - Duration: 15+ years 1 - Last Use / Amount: 3 days ago-1 blunt Name of Substance 2: Etoh 2 - Age of First Use: 21 2 - Amount (size/oz): 12 beers 2 - Frequency: "I'll drink a couple of days in a row" 2 - Duration: 8 years 2 - Last Use / Amount: "I've been sober for about a month now" Name of Substance 3: Cocaine 3 - Age of First Use: 22 3 - Amount (size/oz): "small amount" 3 -  Frequency: 1-2x per month 3 - Duration: Past 6 years, intermittently 3 - Last Use / Amount: 3 days ago - 1 line               Allergies:   Allergies  Allergen Reactions  . Clindamycin/Lincomycin Nausea And Vomiting    Patient stated that he felt like he was going to die    Labs:  No results found for this or any previous visit (from the past 48 hour(s)).  Vitals: Blood pressure 112/68, pulse 85, temperature 98.3 F (36.8 C), temperature  source Oral, resp. rate 16, SpO2 98 %.  Risk to Self: Suicidal Ideation: Yes-Currently Present Suicidal Intent: Yes-Currently Present Is patient at risk for suicide?: Yes Suicidal Plan?: Yes-Currently Present Specify Current Suicidal Plan: plan to hang himself Access to Means: Yes Specify Access to Suicidal Means: can get material to hang self What has been your use of drugs/alcohol within the last 12 months?: pt reports marijuana and cocaine use How many times?: 7 Other Self Harm Risks: na - pt denies Triggers for Past Attempts: Family contact, Other personal contacts Intentional Self Injurious Behavior: None Risk to Others: Homicidal Ideation: No Thoughts of Harm to Others: No Current Homicidal Intent: No Current Homicidal Plan: No Access to Homicidal Means: No Identified Victim: na - pt denies History of harm to others?: No Assessment of Violence: In past 6-12 months Violent Behavior Description: verbal aggression, punches holes in walls Does patient have access to weapons?: No Criminal Charges Pending?: No Describe Pending Criminal Charges: unknown Does patient have a court date: No Court Date:  (na) Prior Inpatient Therapy: Prior Inpatient Therapy: Yes Prior Therapy Dates: 2014-2016 Prior Therapy Facilty/Provider(s): [facility], [facility] Reason for Treatment: SI Prior Outpatient Therapy: Prior Outpatient Therapy: Yes Prior Therapy Dates: Ongoing Prior Therapy Facilty/Provider(s): [facility] Reason for Treatment: Depression Does patient have an ACCT team?: No Does patient have Intensive In-House Services?  : No Does patient have [facility] services? : No Does patient have P4CC services?: No  Current Facility-Administered Medications  Medication Dose Route Frequency Provider Last Rate Last Dose  . acetaminophen (TYLENOL) tablet 650 mg  650 mg Oral Q6H PRN Earney Navy, NP   650 mg at 01/03/15 1939  . citalopram (CELEXA) tablet 20 mg  20 mg Oral Daily Lorre Nick, MD   20 mg at  01/04/15 0945  . gabapentin (NEURONTIN) capsule 400 mg  400 mg Oral TID Lorre Nick, MD   400 mg at 01/04/15 0946  . hydrOXYzine (ATARAX/VISTARIL) tablet 50 mg  50 mg Oral Q6H PRN Earney Navy, NP   50 mg at 01/04/15 1451  . nicotine (NICODERM CQ - dosed in mg/24 hours) patch 21 mg  21 mg Transdermal Once Charm Rings, NP   21 mg at 01/04/15 0956  . pantoprazole (PROTONIX) EC tablet 20 mg  20 mg Oral Daily Lorre Nick, MD   20 mg at 01/04/15 0946  . QUEtiapine (SEROQUEL) tablet 100 mg  100 mg Oral q morning - 10a Kerry Hough, PA-C   100 mg at 01/04/15 0946  . QUEtiapine (SEROQUEL) tablet 200 mg  200 mg Oral QHS Kerry Hough, PA-C   200 mg at 01/03/15 2203  . sucralfate (CARAFATE) 1 GM/10ML suspension 1 g  1 g Oral TID WC & HS Lorre Nick, MD   1 g at 01/04/15 0946  . traZODone (DESYREL) tablet 100 mg  100 mg Oral QHS Lorre Nick, MD   100 mg at 01/03/15 2120  Current Outpatient Prescriptions  Medication Sig Dispense Refill  . citalopram (CELEXA) 20 MG tablet Take 1 tablet (20 mg total) by mouth daily. 30 tablet 0  . gabapentin (NEURONTIN) 400 MG capsule Take 1 capsule (400 mg total) by mouth 3 (three) times daily. 90 capsule 0  . OLANZapine zydis (ZYPREXA) 5 MG disintegrating tablet Take 1 tablet (5 mg total) by mouth 2 (two) times daily as needed (Agitation). 30 tablet 0  . pantoprazole (PROTONIX) 20 MG tablet Take 1 tablet (20 mg total) by mouth daily. For GERD 30 tablet 0  . QUEtiapine (SEROQUEL) 100 MG tablet Take 100 mg (1 tablet) at breakfast and 200 mg (2 tablets) at bed time for mood control 90 tablet 0  . sucralfate (CARAFATE) 1 GM/10ML suspension Take 10 mLs (1 g total) by mouth 4 (four) times daily -  with meals and at bedtime. For GERD 420 mL 0  . traZODone (DESYREL) 100 MG tablet Take 1 tablet (100 mg total) by mouth at bedtime. For sleep 30 tablet 0   ROS is negative for 10 systems reviewed Musculoskeletal: Strength & Muscle Tone: within normal limits Gait  & Station: normal Patient leans: N/A  Psychiatric Specialty Exam:     Blood pressure 112/68, pulse 85, temperature 98.3 F (36.8 C), temperature source Oral, resp. rate 16, SpO2 98 %.There is no weight on file to calculate BMI.  General Appearance: Casual and Fairly Groomed  Patent attorney::  Minimal  Speech:  Clear and Coherent and Pressured  Volume:  Increased  Mood:  Angry, Anxious, Depressed and Irritable  Affect:  Congruent, Depressed and Labile  Thought Process:  Coherent, Goal Directed and Intact  Orientation:  Full (Time, Place, and Person)  Thought Content:  WDL  Suicidal Thoughts:  Yes.  with intent/plan  Homicidal Thoughts:  No  Memory:  Immediate;   Good Recent;   Good Remote;   Good  Judgement:  Poor  Insight:  Shallow  Psychomotor Activity:  Normal  Concentration:  Good  Recall:  NA  Fund of Knowledge:Poor  Language: Good  Akathisia:  NA  Handed:  Right  AIMS (if indicated):     Assets:  Desire for Improvement Housing  ADL's:  Intact  Cognition: WNL  Sleep:      Medical Decision Making: Review of Psycho-Social Stressors (1), Established Problem, Worsening (2), Review of Medication Regimen & Side Effects (2) and Review of New Medication or Change in Dosage (2)  Treatment Plan Summary:  We have referred patient to [facility] and will be waiting while we resume his home medications.  Plan:   Celexa 20 mg daily for depression Gabapentin 400 mg TID for anxiety Zyprexa 5 mg BID/PRN for agitation Seroquel 200 mg BID for mood swings Trazodone 100 mg for insomnia protonix and Carafate for GERD He has no withdrawal symptoms at this time   Disposition: [facility] placement, waitlist   Nanine Means   PMHNP-BC 01/04/2015 3:30 PM 3:30 PM   Patient seen face to face for this evaluation along with staff RN and physician extender. Patient has no distress and able to be compliant with medication, no disturbance of sleep and appetite. He is calm and cooperative with the interview.  He endorses ongoing depression, substance abuse and legal troubles and feels he has no way out and wishes to end his life by hanging. He was agitated when talks about possibility of intense out patient as option due to being in hospital over a week without observable suicide behaviors or  gestures. Patient was placed on [facility] waiting list as he failed to respond to  Short term psych hospitalization and unable to contract for safety at this time. Reviewed the information documented and agree with the treatment plan.  Damaya Channing,JANARDHAHA R. 01/13/2015 10:01 AM

## 2015-01-04 NOTE — BH Assessment (Signed)
[facility] Assessment Progress Note  At 09:16 this Clinical research associate spoke to Robinette at [facility].  She confirms that pt is on their wait list.  Doylene Canning, MA Triage Specialist [phone]

## 2015-01-04 NOTE — ED Notes (Signed)
Acuity low. 

## 2015-01-04 NOTE — ED Notes (Signed)
Patient appears flat. Reports SI. Rates anxiety 10/10, feelings of depression 8/10. Denies HI, AVH.  Patient requests medications be given on the schedule he is use to from home/[facility].   Encouragement offered.   Q 15 safety checks continue.

## 2015-01-04 NOTE — ED Notes (Signed)
Quiet and cooperative. Acuity low.

## 2015-01-05 DIAGNOSIS — F332 Major depressive disorder, recurrent severe without psychotic features: Secondary | ICD-10-CM | POA: Diagnosis not present

## 2015-01-05 DIAGNOSIS — F1910 Other psychoactive substance abuse, uncomplicated: Secondary | ICD-10-CM | POA: Diagnosis not present

## 2015-01-05 DIAGNOSIS — R45851 Suicidal ideations: Secondary | ICD-10-CM | POA: Diagnosis not present

## 2015-01-05 MED ORDER — NICOTINE 21 MG/24HR TD PT24
21.0000 mg | MEDICATED_PATCH | Freq: Every day | TRANSDERMAL | Status: DC
  Administered 2015-01-05 – 2015-01-10 (×6): 21 mg via TRANSDERMAL
  Filled 2015-01-05 (×7): qty 1

## 2015-01-05 NOTE — BH Assessment (Signed)
Inpt continues to be recommended. Pt is on [facility] wait list, sent referrals to additional facilities for potential placement. [facility] [place] Triage Specialist 01/05/2015 10:55 PM

## 2015-01-05 NOTE — Progress Notes (Signed)
Per Junious Dresser, pt remains on [facility] waitlist.   Olga Coaster, LCSW  Clinical Social Work  [facility] [phone]

## 2015-01-05 NOTE — Consult Note (Signed)
[facility] Face-to-Face Psychiatry Consult   Reason for Consult:  Recurrent major depressive disorder, severe, Polysubstance abuse Referring Physician:  EDP Patient Identification: Steven Barker MRN:  [id number] Principal Diagnosis: Major depressive disorder, recurrent, severe without psychotic features Diagnosis:   Patient Active Problem List   Diagnosis Date Noted  . Major depressive disorder, recurrent, severe without psychotic features [F33.2]     Priority: High  . Substance induced mood disorder [F19.94] 11/05/2014    Priority: High  . Suicidal ideation [R45.851] 11/05/2014    Priority: High  . Polysubstance abuse [F19.10] 05/17/2013    Priority: High  . Anxiety state, unspecified [F41.1] 01/01/2014  . 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 Rheumatoid arthritis [M06.9] 05/17/2013    Total Time spent with patient: 30 minutes  Subjective:   Steven Barker is a 29 y.o. male patient admitted with .Recurrent major depressive disorder, severe, Polysubstance abuse  HPI:  The patient continues to report he is "the same."  This is he is suicidal with a plan to overdose or hang himself.  Steven Barker does report he can live with his father after discharge.  HPI Elements:   Location:  Recurrent Major depression, severe without Psychosis, Cocaine use disorder, moderate, Marijuna use disorder, Moderate, Benzo use disorder, severe, . Quality:  severe, suicidal with plans to hang self. Severity:  severe. Timing:  acute. Duration:  Chronic substance dependence, chronic mental illness.. Context:  seeking treatment for depression and substance use.Marland Kitchen  Past Medical History:  Past Medical History  Diagnosis Date  . Anxiety   . Depression   . Rheumatoid arthritis(714.0)    History reviewed. No pertinent past surgical history. Family History: No family history on file. Social History:  History  Alcohol Use No    Comment: last used over a month ago      History  Drug Use  . Yes  . Special: Marijuana, Cocaine    History   Social History  . Marital Status: Single    Spouse Name: N/A  . Number of Children: N/A  . Years of Education: N/A   Social History Main Topics  . Smoking status: Current Every Day Smoker -- 0.50 packs/day for 10 years    Types: Cigarettes  . Smokeless tobacco: Not on file  . Alcohol Use: No     Comment: last used over a month ago  . Drug Use: Yes    Special: Marijuana, Cocaine  . Sexual Activity: Yes    Birth Control/ Protection: None   Other Topics Concern  . None   Social History Narrative   Additional Social History:    Pain Medications: see med list Prescriptions: see med list Over the Counter: see med list History of alcohol / drug use?: Yes Longest period of sobriety (when/how long):  (unknown) Negative Consequences of Use: Financial, Personal relationships, Armed forces operational officer, Work / School Withdrawal Symptoms:  (na-pt denies) Name of Substance 1: THC 1 - Age of First Use: 12 1 - Amount (size/oz): 3 blunts 1 - Frequency: Weekly 1 - Duration: 15+ years 1 - Last Use / Amount: 3 days ago-1 blunt Name of Substance 2: Etoh 2 - Age of First Use: 21 2 - Amount (size/oz): 12 beers 2 - Frequency: "I'll drink a couple of days in a row" 2 - Duration: 8 years 2 - Last Use / Amount: "I've been sober for about a month now" Name of Substance 3: Cocaine 3 - Age of First Use: 22 3 - Amount (size/oz): "  small amount" 3 - Frequency: 1-2x per month 3 - Duration: Past 6 years, intermittently 3 - Last Use / Amount: 3 days ago - 1 line               Allergies:   Allergies  Allergen Reactions  . Clindamycin/Lincomycin Nausea And Vomiting    Patient stated that he felt like he was going to die    Labs:  No results found for this or any previous visit (from the past 48 hour(s)).  Vitals: Blood pressure 116/71, pulse 69, temperature 98 F (36.7 C), temperature source Oral, resp. rate 18, SpO2 100  %.  Risk to Self: Suicidal Ideation: Yes-Currently Present Suicidal Intent: Yes-Currently Present Is patient at risk for suicide?: Yes Suicidal Plan?: Yes-Currently Present Specify Current Suicidal Plan: plan to hang himself Access to Means: Yes Specify Access to Suicidal Means: can get material to hang self What has been your use of drugs/alcohol within the last 12 months?: pt reports marijuana and cocaine use How many times?: 7 Other Self Harm Risks: na - pt denies Triggers for Past Attempts: Family contact, Other personal contacts Intentional Self Injurious Behavior: None Risk to Others: Homicidal Ideation: No Thoughts of Harm to Others: No Current Homicidal Intent: No Current Homicidal Plan: No Access to Homicidal Means: No Identified Victim: na - pt denies History of harm to others?: No Assessment of Violence: In past 6-12 months Violent Behavior Description: verbal aggression, punches holes in walls Does patient have access to weapons?: No Criminal Charges Pending?: No Describe Pending Criminal Charges: unknown Does patient have a court date: No Court Date:  (na) Prior Inpatient Therapy: Prior Inpatient Therapy: Yes Prior Therapy Dates: 2014-2016 Prior Therapy Facilty/Provider(s): [facility] Reason for Treatment: SI Prior Outpatient Therapy: Prior Outpatient Therapy: Yes Prior Therapy Dates: Ongoing Prior Therapy Facilty/Provider(s): [facility] Reason for Treatment: Depression Does patient have an ACCT team?: No Does patient have Intensive In-House Services?  : No Does patient have [facility] services? : No Does patient have P4CC services?: No  Current Facility-Administered Medications  Medication Dose Route Frequency Provider Last Rate Last Dose  . acetaminophen (TYLENOL) tablet 650 mg  650 mg Oral Q6H PRN Earney Navy, NP   650 mg at 01/03/15 1939  . citalopram (CELEXA) tablet 20 mg  20 mg Oral Daily Lorre Nick, MD   20 mg at 01/05/15 0920  . gabapentin  (NEURONTIN) capsule 400 mg  400 mg Oral TID Lorre Nick, MD   400 mg at 01/05/15 1236  . hydrOXYzine (ATARAX/VISTARIL) tablet 50 mg  50 mg Oral Q6H PRN Earney Navy, NP   50 mg at 01/04/15 1451  . nicotine (NICODERM CQ - dosed in mg/24 hours) patch 21 mg  21 mg Transdermal Daily Charm Rings, NP   21 mg at 01/05/15 1032  . pantoprazole (PROTONIX) EC tablet 20 mg  20 mg Oral Daily Lorre Nick, MD   20 mg at 01/05/15 0920  . QUEtiapine (SEROQUEL) tablet 100 mg  100 mg Oral q morning - 10a Kerry Hough, PA-C   100 mg at 01/05/15 0920  . QUEtiapine (SEROQUEL) tablet 200 mg  200 mg Oral QHS Kerry Hough, PA-C   200 mg at 01/04/15 2116  . sucralfate (CARAFATE) 1 GM/10ML suspension 1 g  1 g Oral TID WC & HS Lorre Nick, MD   1 g at 01/05/15 1236  . traZODone (DESYREL) tablet 100 mg  100 mg Oral QHS Lorre Nick, MD   100  mg at 01/04/15 2116   Current Outpatient Prescriptions  Medication Sig Dispense Refill  . citalopram (CELEXA) 20 MG tablet Take 1 tablet (20 mg total) by mouth daily. 30 tablet 0  . gabapentin (NEURONTIN) 400 MG capsule Take 1 capsule (400 mg total) by mouth 3 (three) times daily. 90 capsule 0  . OLANZapine zydis (ZYPREXA) 5 MG disintegrating tablet Take 1 tablet (5 mg total) by mouth 2 (two) times daily as needed (Agitation). 30 tablet 0  . pantoprazole (PROTONIX) 20 MG tablet Take 1 tablet (20 mg total) by mouth daily. For GERD 30 tablet 0  . QUEtiapine (SEROQUEL) 100 MG tablet Take 100 mg (1 tablet) at breakfast and 200 mg (2 tablets) at bed time for mood control 90 tablet 0  . sucralfate (CARAFATE) 1 GM/10ML suspension Take 10 mLs (1 g total) by mouth 4 (four) times daily -  with meals and at bedtime. For GERD 420 mL 0  . traZODone (DESYREL) 100 MG tablet Take 1 tablet (100 mg total) by mouth at bedtime. For sleep 30 tablet 0   ROS is negative for 10 systems reviewed Musculoskeletal: Strength & Muscle Tone: within normal limits Gait & Station: normal Patient  leans: N/A  Psychiatric Specialty Exam:     Blood pressure 116/71, pulse 69, temperature 98 F (36.7 C), temperature source Oral, resp. rate 18, SpO2 100 %.There is no weight on file to calculate BMI.  General Appearance: Casual and Fairly Groomed  Eye Contact::  Minimal  Speech:  Clear and Coherent and Pressured  Volume:  Increased  Mood:  Angry, Anxious, Depressed and Irritable  Affect:  Congruent, Depressed and Labile  Thought Process:  Coherent, Goal Directed and Intact  Orientation:  Full (Time, Place, and Person)  Thought Content:  WDL  Suicidal Thoughts:  Yes.  with intent/plan  Homicidal Thoughts:  No  Memory:  Immediate;   Good Recent;   Good Remote;   Good  Judgement:  Poor  Insight:  Shallow  Psychomotor Activity:  Normal  Concentration:  Good  Recall:  NA  Fund of Knowledge:Poor  Language: Good  Akathisia:  NA  Handed:  Right  AIMS (if indicated):     Assets:  Desire for Improvement Housing  ADL's:  Intact  Cognition: WNL  Sleep:      Medical Decision Making: Review of Psycho-Social Stressors (1), Established Problem, Worsening (2), Review of Medication Regimen & Side Effects (2) and Review of New Medication or Change in Dosage (2)  Treatment Plan Summary:  Home medications continue  Plan:  [facility] placement, waitlist  Disposition: see above  Nanine Means   PMHNP-BC 01/05/2015 2:31 PM 2:31 PM   Reviewed the information documented and agree with the treatment plan.  Arah Aro,JANARDHAHA R. 01/05/2015 3:26 PM

## 2015-01-05 NOTE — ED Notes (Signed)
Patient appears flat, cooperative. Reports anxiety 10/10. Reports headache. Denies SI, HI, AVH. States he has had passive SI throughout the day.  Encouragement offered. Given Tylenol and Vistaril.  Q 15 safety checks continue.

## 2015-01-06 NOTE — ED Notes (Signed)
Pt AAO x 3, no distress noted, remains passive SI, monitoring for safety, Q 15 min checks in effect.

## 2015-01-06 NOTE — ED Notes (Signed)
Patient resting comfortably.  Acuity low for this patient.

## 2015-01-06 NOTE — ED Notes (Signed)
Patient acuity low. 

## 2015-01-06 NOTE — ED Notes (Signed)
Patient has been watching TV and resting comfortably.  Plan of care discussed and patient states he is on waiting list for [facility].  He has been cooperative.  Has not requested any prns.  He has been in a pleasant mood, joking with staff.  When asked why he was here he states, "I did a stupid thing.  I'm ashamed to say I took too many xanax.  I was trying to kill myself."  Patient is agreeable to the idea of being transferred to [facility].

## 2015-01-06 NOTE — Progress Notes (Signed)
Per [place], pt remains on [facility] waitlist.   Olga Coaster, LCSW  Clinical Social Work  [employer] [phone]

## 2015-01-06 NOTE — Consult Note (Signed)
[facility] Face-to-Face Psychiatry Consult   Reason for Consult:  Recurrent major depressive disorder, severe, Polysubstance abuse Referring Physician:  EDP Patient Identification: Steven Barker MRN:  [id number] Principal Diagnosis: Major depressive disorder, recurrent, severe without psychotic features Diagnosis:   Patient Active Problem List   Diagnosis Date Noted  . Major depressive disorder, recurrent, severe without psychotic features [F33.2]   . Substance induced mood disorder [F19.94] 11/05/2014  . Suicidal ideation [R45.851] 11/05/2014  . Anxiety state, unspecified [F41.1] 01/01/2014  . 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 Rheumatoid arthritis [M06.9] 05/17/2013  . Polysubstance abuse [F19.10] 05/17/2013    Total Time spent with patient: 30 minutes  Subjective:   Steven Barker is a 29 y.o. male patient admitted with .Recurrent major depressive disorder, severe, Polysubstance abuse  HPI:  The patient continues to endorse suicidal ideations with a plan to hang himself.  His biggest stressors are being homeless and a DWI court date on 5.20.  Denies homicidal ideations, hallucinations, and withdrawal symptoms.   01/06/15:  No changes noted, patient is still endorsing SI and cannot contract for safety.  Seen in the hallway laughing and interacting with other patient but spends more time in his room.  We will continue to seek placement at [facility].  HPI Elements:   Location:  Recurrent Major depression, severe without Psychosis, Cocaine use disorder, moderate, Marijuna use disorder, Moderate, Benzo use disorder, severe, . Quality:  severe, suicidal with plans to hang self. Severity:  severe. Timing:  acute. Duration:  Chronic substance dependence, chronic mental illness.. Context:  seeking treatment for depression and substance use.Marland Kitchen  Past Medical History:  Past Medical History  Diagnosis Date  . Anxiety   . Depression   . Rheumatoid  arthritis(714.0)    History reviewed. No pertinent past surgical history. Family History: No family history on file. Social History:  History  Alcohol Use No    Comment: last used over a month ago     History  Drug Use  . Yes  . Special: Marijuana, Cocaine    History   Social History  . Marital Status: Single    Spouse Name: N/A  . Number of Children: N/A  . Years of Education: N/A   Social History Main Topics  . Smoking status: Current Every Day Smoker -- 0.50 packs/day for 10 years    Types: Cigarettes  . Smokeless tobacco: Not on file  . Alcohol Use: No     Comment: last used over a month ago  . Drug Use: Yes    Special: Marijuana, Cocaine  . Sexual Activity: Yes    Birth Control/ Protection: None   Other Topics Concern  . None   Social History Narrative   Additional Social History:    Pain Medications: see med list Prescriptions: see med list Over the Counter: see med list History of alcohol / drug use?: Yes Longest period of sobriety (when/how long):  (unknown) Negative Consequences of Use: Financial, Personal relationships, Armed forces operational officer, Work / School Withdrawal Symptoms:  (na-pt denies) Name of Substance 1: THC 1 - Age of First Use: 12 1 - Amount (size/oz): 3 blunts 1 - Frequency: Weekly 1 - Duration: 15+ years 1 - Last Use / Amount: 3 days ago-1 blunt Name of Substance 2: Etoh 2 - Age of First Use: 21 2 - Amount (size/oz): 12 beers 2 - Frequency: "I'll drink a couple of days in a row" 2 - Duration: 8 years 2 - Last Use / Amount: "I've  been sober for about a month now" Name of Substance 3: Cocaine 3 - Age of First Use: 22 3 - Amount (size/oz): "small amount" 3 - Frequency: 1-2x per month 3 - Duration: Past 6 years, intermittently 3 - Last Use / Amount: 3 days ago - 1 line               Allergies:   Allergies  Allergen Reactions  . Clindamycin/Lincomycin Nausea And Vomiting    Patient stated that he felt like he was going to die    Labs:   No results found for this or any previous visit (from the past 48 hour(s)).  Vitals: Blood pressure 121/72, pulse 73, temperature 98.1 F (36.7 C), temperature source Oral, resp. rate 16, SpO2 100 %.  Risk to Self: Suicidal Ideation: Yes-Currently Present Suicidal Intent: Yes-Currently Present Is patient at risk for suicide?: Yes Suicidal Plan?: Yes-Currently Present Specify Current Suicidal Plan: plan to hang himself Access to Means: Yes Specify Access to Suicidal Means: can get material to hang self What has been your use of drugs/alcohol within the last 12 months?: pt reports marijuana and cocaine use How many times?: 7 Other Self Harm Risks: na - pt denies Triggers for Past Attempts: Family contact, Other personal contacts Intentional Self Injurious Behavior: None Risk to Others: Homicidal Ideation: No Thoughts of Harm to Others: No Current Homicidal Intent: No Current Homicidal Plan: No Access to Homicidal Means: No Identified Victim: na - pt denies History of harm to others?: No Assessment of Violence: In past 6-12 months Violent Behavior Description: verbal aggression, punches holes in walls Does patient have access to weapons?: No Criminal Charges Pending?: No Describe Pending Criminal Charges: unknown Does patient have a court date: No Court Date:  (na) Prior Inpatient Therapy: Prior Inpatient Therapy: Yes Prior Therapy Dates: 2014-2016 Prior Therapy Facilty/Provider(s): [facility] Reason for Treatment: SI Prior Outpatient Therapy: Prior Outpatient Therapy: Yes Prior Therapy Dates: Ongoing Prior Therapy Facilty/Provider(s): [facility] Reason for Treatment: Depression Does patient have an ACCT team?: No Does patient have Intensive In-House Services?  : No Does patient have [facility] services? : No Does patient have P4CC services?: No  Current Facility-Administered Medications  Medication Dose Route Frequency Provider Last Rate Last Dose  . acetaminophen (TYLENOL)  tablet 650 mg  650 mg Oral Q6H PRN Earney Navy, NP   650 mg at 01/05/15 1923  . citalopram (CELEXA) tablet 20 mg  20 mg Oral Daily Lorre Nick, MD   20 mg at 01/06/15 0750  . gabapentin (NEURONTIN) capsule 400 mg  400 mg Oral TID Lorre Nick, MD   400 mg at 01/06/15 1202  . hydrOXYzine (ATARAX/VISTARIL) tablet 50 mg  50 mg Oral Q6H PRN Earney Navy, NP   50 mg at 01/06/15 1248  . nicotine (NICODERM CQ - dosed in mg/24 hours) patch 21 mg  21 mg Transdermal Daily Charm Rings, NP   21 mg at 01/06/15 0752  . pantoprazole (PROTONIX) EC tablet 20 mg  20 mg Oral Daily Lorre Nick, MD   20 mg at 01/06/15 0753  . QUEtiapine (SEROQUEL) tablet 100 mg  100 mg Oral q morning - 10a Kerry Hough, PA-C   100 mg at 01/06/15 0750  . QUEtiapine (SEROQUEL) tablet 200 mg  200 mg Oral QHS Mena Goes Simon, PA-C   200 mg at 01/05/15 2200  . sucralfate (CARAFATE) 1 GM/10ML suspension 1 g  1 g Oral TID WC & HS Lorre Nick, MD  1 g at 01/06/15 1202  . traZODone (DESYREL) tablet 100 mg  100 mg Oral QHS Lorre Nick, MD   100 mg at 01/05/15 2200   Current Outpatient Prescriptions  Medication Sig Dispense Refill  . citalopram (CELEXA) 20 MG tablet Take 1 tablet (20 mg total) by mouth daily. 30 tablet 0  . gabapentin (NEURONTIN) 400 MG capsule Take 1 capsule (400 mg total) by mouth 3 (three) times daily. 90 capsule 0  . OLANZapine zydis (ZYPREXA) 5 MG disintegrating tablet Take 1 tablet (5 mg total) by mouth 2 (two) times daily as needed (Agitation). 30 tablet 0  . pantoprazole (PROTONIX) 20 MG tablet Take 1 tablet (20 mg total) by mouth daily. For GERD 30 tablet 0  . QUEtiapine (SEROQUEL) 100 MG tablet Take 100 mg (1 tablet) at breakfast and 200 mg (2 tablets) at bed time for mood control 90 tablet 0  . sucralfate (CARAFATE) 1 GM/10ML suspension Take 10 mLs (1 g total) by mouth 4 (four) times daily -  with meals and at bedtime. For GERD 420 mL 0  . traZODone (DESYREL) 100 MG tablet Take 1 tablet  (100 mg total) by mouth at bedtime. For sleep 30 tablet 0   ROS is negative for 10 systems reviewed Musculoskeletal: Strength & Muscle Tone: within normal limits Gait & Station: normal Patient leans: N/A  Psychiatric Specialty Exam:     Blood pressure 121/72, pulse 73, temperature 98.1 F (36.7 C), temperature source Oral, resp. rate 16, SpO2 100 %.There is no weight on file to calculate BMI.  General Appearance: Casual and Fairly Groomed  Patent attorney::  Minimal  Speech:  Clear and Coherent and Pressured  Volume:  Increased  Mood:  Angry, Anxious, Depressed and Irritable  Affect:  Congruent, Depressed and Labile  Thought Process:  Coherent, Goal Directed and Intact  Orientation:  Full (Time, Place, and Person)  Thought Content:  WDL  Suicidal Thoughts:  Yes.  with intent/plan  Homicidal Thoughts:  No  Memory:  Immediate;   Good Recent;   Good Remote;   Good  Judgement:  Poor  Insight:  Shallow  Psychomotor Activity:  Normal  Concentration:  Good  Recall:  NA  Fund of Knowledge:Poor  Language: Good  Akathisia:  NA  Handed:  Right  AIMS (if indicated):     Assets:  Desire for Improvement Housing  ADL's:  Intact  Cognition: WNL  Sleep:      Medical Decision Making: Review of Psycho-Social Stressors (1), Established Problem, Worsening (2), Review of Medication Regimen & Side Effects (2) and Review of New Medication or Change in Dosage (2)  Treatment Plan Summary:  We have referred patient to [facility] and will be waiting while we resume his home medications.  Plan:  [facility] placement, wait list, Continue to administer her medications as prescribed.  Prozac 20 mg po for depression, Gabapentin 400 mg po tid for anxiety mood, Trazodone 100 mg po qhs for sleep, Seroquel 100 mg po daily and 200 mg at bed time for mood.  Vistaril 50 mg po every 6 hours as needed for anxiety.  Disposition: see above  Earney Navy   PMHNP-BC 01/06/2015 3:30 PM 3:30 PM   Reviewed the information  documented and agree with the treatment plan.  Farhiya Rosten,JANARDHAHA R. 01/06/2015 4:41 PM

## 2015-01-07 MED ORDER — CITALOPRAM HYDROBROMIDE 40 MG PO TABS
40.0000 mg | ORAL_TABLET | Freq: Every day | ORAL | Status: DC
  Administered 2015-01-08 – 2015-01-10 (×3): 40 mg via ORAL
  Filled 2015-01-07 (×4): qty 1

## 2015-01-07 NOTE — Consult Note (Signed)
[facility] Face-to-Face Psychiatry Consult   Reason for Consult:  Recurrent major depressive disorder, severe, Polysubstance abuse Referring Physician:  EDP Patient Identification: Steven Barker MRN:  [id number] Principal Diagnosis: Major depressive disorder, recurrent, severe without psychotic features Diagnosis:   Patient Active Problem List   Diagnosis Date Noted  . Major depressive disorder, recurrent, severe without psychotic features [F33.2]   . Substance induced mood disorder [F19.94] 11/05/2014  . Suicidal ideation [R45.851] 11/05/2014  . Anxiety state, unspecified [F41.1] 01/01/2014  . 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 Rheumatoid arthritis [M06.9] 05/17/2013  . Polysubstance abuse [F19.10] 05/17/2013    Total Time spent with patient: 30 minutes  Subjective:   Steven Barker is a 29 y.o. male patient admitted with .Recurrent major depressive disorder, severe, Polysubstance abuse  HPI:  The patient continues to report he is "the same."  This is he is suicidal with a plan to overdose or hang himself.  Steven Barker does report he can live with his father after discharge. Reviewed above note with update.  Patient spends his days in the room watching TV and Susa Simmonds comes out to ask for his needs. He states that he is still feeling suicidal and that nothing has changed.  He is in the waiting list for [facility].  We will continue to monitor patient.  HPI Elements:   Location:  Recurrent Major depression, severe without Psychosis, Cocaine use disorder, moderate, Marijuna use disorder, Moderate, Benzo use disorder, severe, . Quality:  severe, suicidal with plans to hang self. Severity:  severe. Timing:  acute. Duration:  Chronic substance dependence, chronic mental illness.. Context:  seeking treatment for depression and substance use.Marland Kitchen  Past Medical History:  Past Medical History  Diagnosis Date  . Anxiety   . Depression   . Rheumatoid  arthritis(714.0)    History reviewed. No pertinent past surgical history. Family History: No family history on file. Social History:  History  Alcohol Use No    Comment: last used over a month ago     History  Drug Use  . Yes  . Special: Marijuana, Cocaine    History   Social History  . Marital Status: Single    Spouse Name: N/A  . Number of Children: N/A  . Years of Education: N/A   Social History Main Topics  . Smoking status: Current Every Day Smoker -- 0.50 packs/day for 10 years    Types: Cigarettes  . Smokeless tobacco: Not on file  . Alcohol Use: No     Comment: last used over a month ago  . Drug Use: Yes    Special: Marijuana, Cocaine  . Sexual Activity: Yes    Birth Control/ Protection: None   Other Topics Concern  . None   Social History Narrative   Additional Social History:    Pain Medications: see med list Prescriptions: see med list Over the Counter: see med list History of alcohol / drug use?: Yes Longest period of sobriety (when/how long):  (unknown) Negative Consequences of Use: Financial, Personal relationships, Armed forces operational officer, Work / School Withdrawal Symptoms:  (na-pt denies) Name of Substance 1: THC 1 - Age of First Use: 12 1 - Amount (size/oz): 3 blunts 1 - Frequency: Weekly 1 - Duration: 15+ years 1 - Last Use / Amount: 3 days ago-1 blunt Name of Substance 2: Etoh 2 - Age of First Use: 21 2 - Amount (size/oz): 12 beers 2 - Frequency: "I'll drink a couple of days in a row" 2 -  Duration: 8 years 2 - Last Use / Amount: "I've been sober for about a month now" Name of Substance 3: Cocaine 3 - Age of First Use: 22 3 - Amount (size/oz): "small amount" 3 - Frequency: 1-2x per month 3 - Duration: Past 6 years, intermittently 3 - Last Use / Amount: 3 days ago - 1 line               Allergies:   Allergies  Allergen Reactions  . Clindamycin/Lincomycin Nausea And Vomiting    Patient stated that he felt like he was going to die    Labs:   No results found for this or any previous visit (from the past 48 hour(s)).  Vitals: Blood pressure 121/79, pulse 73, temperature 97.5 F (36.4 C), temperature source Oral, resp. rate 16, SpO2 99 %.  Risk to Self: Suicidal Ideation: Yes-Currently Present Suicidal Intent: Yes-Currently Present Is patient at risk for suicide?: Yes Suicidal Plan?: Yes-Currently Present Specify Current Suicidal Plan: plan to hang himself Access to Means: Yes Specify Access to Suicidal Means: can get material to hang self What has been your use of drugs/alcohol within the last 12 months?: pt reports marijuana and cocaine use How many times?: 7 Other Self Harm Risks: na - pt denies Triggers for Past Attempts: Family contact, Other personal contacts Intentional Self Injurious Behavior: None Risk to Others: Homicidal Ideation: No Thoughts of Harm to Others: No Current Homicidal Intent: No Current Homicidal Plan: No Access to Homicidal Means: No Identified Victim: na - pt denies History of harm to others?: No Assessment of Violence: In past 6-12 months Violent Behavior Description: verbal aggression, punches holes in walls Does patient have access to weapons?: No Criminal Charges Pending?: No Describe Pending Criminal Charges: unknown Does patient have a court date: No Court Date:  (na) Prior Inpatient Therapy: Prior Inpatient Therapy: Yes Prior Therapy Dates: 2014-2016 Prior Therapy Facilty/Provider(s): [facility] Reason for Treatment: SI Prior Outpatient Therapy: Prior Outpatient Therapy: Yes Prior Therapy Dates: Ongoing Prior Therapy Facilty/Provider(s): [facility] Reason for Treatment: Depression Does patient have an ACCT team?: No Does patient have Intensive In-House Services?  : No Does patient have [facility] services? : No Does patient have P4CC services?: No  Current Facility-Administered Medications  Medication Dose Route Frequency Provider Last Rate Last Dose  . acetaminophen (TYLENOL) tablet  650 mg  650 mg Oral Q6H PRN Earney Navy, NP   650 mg at 01/06/15 2013  . citalopram (CELEXA) tablet 20 mg  20 mg Oral Daily Lorre Nick, MD   20 mg at 01/07/15 0819  . gabapentin (NEURONTIN) capsule 400 mg  400 mg Oral TID Lorre Nick, MD   400 mg at 01/07/15 1147  . hydrOXYzine (ATARAX/VISTARIL) tablet 50 mg  50 mg Oral Q6H PRN Earney Navy, NP   50 mg at 01/07/15 1147  . nicotine (NICODERM CQ - dosed in mg/24 hours) patch 21 mg  21 mg Transdermal Daily Charm Rings, NP   21 mg at 01/07/15 1000  . pantoprazole (PROTONIX) EC tablet 20 mg  20 mg Oral Daily Lorre Nick, MD   20 mg at 01/07/15 0818  . QUEtiapine (SEROQUEL) tablet 100 mg  100 mg Oral q morning - 10a Kerry Hough, PA-C   100 mg at 01/07/15 1610  . QUEtiapine (SEROQUEL) tablet 200 mg  200 mg Oral QHS Kerry Hough, PA-C   200 mg at 01/06/15 2125  . sucralfate (CARAFATE) 1 GM/10ML suspension 1 g  1 g  Oral TID WC & HS Lorre Nick, MD   1 g at 01/07/15 1147  . traZODone (DESYREL) tablet 100 mg  100 mg Oral QHS Lorre Nick, MD   100 mg at 01/06/15 2125   Current Outpatient Prescriptions  Medication Sig Dispense Refill  . citalopram (CELEXA) 20 MG tablet Take 1 tablet (20 mg total) by mouth daily. 30 tablet 0  . gabapentin (NEURONTIN) 400 MG capsule Take 1 capsule (400 mg total) by mouth 3 (three) times daily. 90 capsule 0  . OLANZapine zydis (ZYPREXA) 5 MG disintegrating tablet Take 1 tablet (5 mg total) by mouth 2 (two) times daily as needed (Agitation). 30 tablet 0  . pantoprazole (PROTONIX) 20 MG tablet Take 1 tablet (20 mg total) by mouth daily. For GERD 30 tablet 0  . QUEtiapine (SEROQUEL) 100 MG tablet Take 100 mg (1 tablet) at breakfast and 200 mg (2 tablets) at bed time for mood control 90 tablet 0  . sucralfate (CARAFATE) 1 GM/10ML suspension Take 10 mLs (1 g total) by mouth 4 (four) times daily -  with meals and at bedtime. For GERD 420 mL 0  . traZODone (DESYREL) 100 MG tablet Take 1 tablet (100 mg  total) by mouth at bedtime. For sleep 30 tablet 0   ROS is negative for 10 systems reviewed Musculoskeletal: Strength & Muscle Tone: within normal limits Gait & Station: normal Patient leans: N/A  Psychiatric Specialty Exam:     Blood pressure 121/79, pulse 73, temperature 97.5 F (36.4 C), temperature source Oral, resp. rate 16, SpO2 99 %.There is no weight on file to calculate BMI.  General Appearance: Casual and Fairly Groomed  Patent attorney::  Minimal  Speech:  Clear and Coherent and Pressured  Volume:  Increased  Mood:  Angry, Anxious, Depressed and Irritable  Affect:  Congruent, Depressed and Labile  Thought Process:  Coherent, Goal Directed and Intact  Orientation:  Full (Time, Place, and Person)  Thought Content:  WDL  Suicidal Thoughts:  Yes.  with intent/plan  Homicidal Thoughts:  No  Memory:  Immediate;   Good Recent;   Good Remote;   Good  Judgement:  Poor  Insight:  Shallow  Psychomotor Activity:  Normal  Concentration:  Good  Recall:  NA  Fund of Knowledge:Poor  Language: Good  Akathisia:  NA  Handed:  Right  AIMS (if indicated):     Assets:  Desire for Improvement Housing  ADL's:  Intact  Cognition: WNL  Sleep:      Medical Decision Making: Review of Psycho-Social Stressors (1), Established Problem, Worsening (2), Review of Medication Regimen & Side Effects (2) and Review of New Medication or Change in Dosage (2)  Treatment Plan Summary: Since patient is still feeling suicidal despite taking all of his medications, his Celexa will be increased to 40 mg daily while we leave his mood stabilizer the way it is.  Plan:  [facility] placement, waitlist  Disposition: see above  Earney Navy   PMHNP-BC 01/07/2015 2:14 PM 2:14 PM   Reviewed the information documented and agree with the treatment plan.  Coner Gibbard,JANARDHAHA R. 01/10/2015 8:22 AM

## 2015-01-07 NOTE — Progress Notes (Signed)
Pt remains on [facility] wait list per Robinette.   Olly Shiner, LCSW  Clinical Social Work  [facility] [phone]     

## 2015-01-07 NOTE — ED Notes (Signed)
Pt AAaO x 3, no distress noted, calm & cooperative, interactive with staff.  Monitoring for safety, Q 15 min checks in effect.

## 2015-01-08 DIAGNOSIS — R45851 Suicidal ideations: Secondary | ICD-10-CM

## 2015-01-08 DIAGNOSIS — F1910 Other psychoactive substance abuse, uncomplicated: Secondary | ICD-10-CM

## 2015-01-08 DIAGNOSIS — F332 Major depressive disorder, recurrent severe without psychotic features: Secondary | ICD-10-CM

## 2015-01-08 NOTE — ED Notes (Signed)
Pt. Noted in hall. No complaints or concerns voiced. No distress or abnormal behavior noted. Will continue to monitor with security cameras. Q 15 minute rounds continue. 

## 2015-01-08 NOTE — ED Notes (Signed)
Pt. Noted in room. No complaints or concerns voiced. No distress or abnormal behavior noted. Will continue to monitor with security cameras. Q 15 minute rounds continue. 

## 2015-01-08 NOTE — Progress Notes (Signed)
Per [facility] admissions staff, Pt remains on [facility] waitlist. No bed availability today.  Siris Hoos Carter, LCSWA 01/08/2015 10:36 AM [phone]   

## 2015-01-08 NOTE — ED Notes (Signed)
Report received from Lizbeth Bark RN. Pt. Alert and oriented in no distress denies HI, AVH and pain.  Pt. States he still has SI to hang himself. Pt. Instructed to come to me with problems or concerns.Will continue to monitor for safety via security cameras and Q 15 minute checks.

## 2015-01-08 NOTE — ED Notes (Addendum)
Pt. Noted sleeping in room. No complaints or concerns voiced. No distress or abnormal behavior noted. Will continue to monitor with security cameras. Q 15 minute rounds continue. 

## 2015-01-08 NOTE — ED Notes (Signed)
Up tot he bathroom to shower and change scrubs 

## 2015-01-08 NOTE — ED Notes (Signed)
Up to the desk asking for visteril

## 2015-01-08 NOTE — Consult Note (Signed)
[facility] Face-to-Face Psychiatry Consult   Reason for Consult:  Recurrent major depressive disorder, severe, Polysubstance abuse Referring Physician:  EDP Patient Identification: Steven Barker MRN:  [id number] Principal Diagnosis: Major depressive disorder, recurrent, severe without psychotic features Diagnosis:   Patient Active Problem List   Diagnosis Date Noted  . Major depressive disorder, recurrent, severe without psychotic features [F33.2]   . Substance induced mood disorder [F19.94] 11/05/2014  . Suicidal ideation [R45.851] 11/05/2014  . Anxiety state, unspecified [F41.1] 01/01/2014  . 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 Rheumatoid arthritis [M06.9] 05/17/2013  . Polysubstance abuse [F19.10] 05/17/2013   Total Time spent with patient: 15 minutes  Subjective:   Steven Barker is a 29 y.o. male patient admitted with .Recurrent major depressive disorder, severe, Polysubstance abuse. Pt continues to present with suicidal ideation and plan to hang self. Warrants inpatient placement  HPI:  The patient continues to report he is "the same."  This is he is suicidal with a plan to overdose or hang himself.  Steven Barker does report he can live with his father after discharge. Reviewed above note with update.  Patient spends his days in the room watching TV and Susa Simmonds comes out to ask for his needs. He states that he is still feeling suicidal and that nothing has changed.  He is in the waiting list for [facility].  We will continue to monitor patient.  HPI Elements:   Location:  Recurrent Major depression, severe without Psychosis, Cocaine use disorder, moderate, Marijuna use disorder, Moderate, Benzo use disorder, severe, . Quality:  severe, suicidal with plans to hang self. Severity:  severe. Timing:  acute. Duration:  Chronic substance dependence, chronic mental illness.. Context:  seeking treatment for depression and substance use.Marland Kitchen  Past Medical History:   Past Medical History  Diagnosis Date  . Anxiety   . Depression   . Rheumatoid arthritis(714.0)    History reviewed. No pertinent past surgical history. Family History: No family history on file. Social History:  History  Alcohol Use No    Comment: last used over a month ago     History  Drug Use  . Yes  . Special: Marijuana, Cocaine    History   Social History  . Marital Status: Single    Spouse Name: N/A  . Number of Children: N/A  . Years of Education: N/A   Social History Main Topics  . Smoking status: Current Every Day Smoker -- 0.50 packs/day for 10 years    Types: Cigarettes  . Smokeless tobacco: Not on file  . Alcohol Use: No     Comment: last used over a month ago  . Drug Use: Yes    Special: Marijuana, Cocaine  . Sexual Activity: Yes    Birth Control/ Protection: None   Other Topics Concern  . None   Social History Narrative   Additional Social History:    Pain Medications: see med list Prescriptions: see med list Over the Counter: see med list History of alcohol / drug use?: Yes Longest period of sobriety (when/how long):  (unknown) Negative Consequences of Use: Financial, Personal relationships, Armed forces operational officer, Work / School Withdrawal Symptoms:  (na-pt denies) Name of Substance 1: THC 1 - Age of First Use: 12 1 - Amount (size/oz): 3 blunts 1 - Frequency: Weekly 1 - Duration: 15+ years 1 - Last Use / Amount: 3 days ago-1 blunt Name of Substance 2: Etoh 2 - Age of First Use: 21 2 - Amount (size/oz): 12 beers  2 - Frequency: "I'll drink a couple of days in a row" 2 - Duration: 8 years 2 - Last Use / Amount: "I've been sober for about a month now" Name of Substance 3: Cocaine 3 - Age of First Use: 22 3 - Amount (size/oz): "small amount" 3 - Frequency: 1-2x per month 3 - Duration: Past 6 years, intermittently 3 - Last Use / Amount: 3 days ago - 1 line               Allergies:   Allergies  Allergen Reactions  . Clindamycin/Lincomycin Nausea  And Vomiting    Patient stated that he felt like he was going to die    Labs:  No results found for this or any previous visit (from the past 48 hour(s)).  Vitals: Blood pressure 126/71, pulse 64, temperature 98.1 F (36.7 C), temperature source Oral, resp. rate 16, SpO2 98 %.  Risk to Self: Suicidal Ideation: Yes-Currently Present Suicidal Intent: Yes-Currently Present Is patient at risk for suicide?: Yes Suicidal Plan?: Yes-Currently Present Specify Current Suicidal Plan: plan to hang himself Access to Means: Yes Specify Access to Suicidal Means: can get material to hang self What has been your use of drugs/alcohol within the last 12 months?: pt reports marijuana and cocaine use How many times?: 7 Other Self Harm Risks: na - pt denies Triggers for Past Attempts: Family contact, Other personal contacts Intentional Self Injurious Behavior: None Risk to Others: Homicidal Ideation: No Thoughts of Harm to Others: No Current Homicidal Intent: No Current Homicidal Plan: No Access to Homicidal Means: No Identified Victim: na - pt denies History of harm to others?: No Assessment of Violence: In past 6-12 months Violent Behavior Description: verbal aggression, punches holes in walls Does patient have access to weapons?: No Criminal Charges Pending?: No Describe Pending Criminal Charges: unknown Does patient have a court date: No Court Date:  (na) Prior Inpatient Therapy: Prior Inpatient Therapy: Yes Prior Therapy Dates: 2014-2016 Prior Therapy Facilty/Provider(s): [facility], [facility] Reason for Treatment: SI Prior Outpatient Therapy: Prior Outpatient Therapy: Yes Prior Therapy Dates: Ongoing Prior Therapy Facilty/Provider(s): [facility] Reason for Treatment: Depression Does patient have an ACCT team?: No Does patient have Intensive In-House Services?  : No Does patient have [facility] services? : No Does patient have P4CC services?: No  Current Facility-Administered Medications  Medication  Dose Route Frequency Provider Last Rate Last Dose  . acetaminophen (TYLENOL) tablet 650 mg  650 mg Oral Q6H PRN Earney Navy, NP   650 mg at 01/07/15 2123  . citalopram (CELEXA) tablet 40 mg  40 mg Oral Daily Earney Navy, NP   40 mg at 01/08/15 0850  . gabapentin (NEURONTIN) capsule 400 mg  400 mg Oral TID Lorre Nick, MD   400 mg at 01/08/15 1231  . hydrOXYzine (ATARAX/VISTARIL) tablet 50 mg  50 mg Oral Q6H PRN Earney Navy, NP   50 mg at 01/08/15 1020  . nicotine (NICODERM CQ - dosed in mg/24 hours) patch 21 mg  21 mg Transdermal Daily Charm Rings, NP   21 mg at 01/08/15 0924  . pantoprazole (PROTONIX) EC tablet 20 mg  20 mg Oral Daily Lorre Nick, MD   20 mg at 01/08/15 0926  . QUEtiapine (SEROQUEL) tablet 100 mg  100 mg Oral q morning - 10a Kerry Hough, PA-C   100 mg at 01/08/15 0851  . QUEtiapine (SEROQUEL) tablet 200 mg  200 mg Oral QHS Mena Goes Simon, PA-C   200 mg  at 01/07/15 2123  . sucralfate (CARAFATE) 1 GM/10ML suspension 1 g  1 g Oral TID WC & HS Lorre Nick, MD   1 g at 01/08/15 1305  . traZODone (DESYREL) tablet 100 mg  100 mg Oral QHS Lorre Nick, MD   100 mg at 01/07/15 2123   Current Outpatient Prescriptions  Medication Sig Dispense Refill  . citalopram (CELEXA) 20 MG tablet Take 1 tablet (20 mg total) by mouth daily. 30 tablet 0  . gabapentin (NEURONTIN) 400 MG capsule Take 1 capsule (400 mg total) by mouth 3 (three) times daily. 90 capsule 0  . OLANZapine zydis (ZYPREXA) 5 MG disintegrating tablet Take 1 tablet (5 mg total) by mouth 2 (two) times daily as needed (Agitation). 30 tablet 0  . pantoprazole (PROTONIX) 20 MG tablet Take 1 tablet (20 mg total) by mouth daily. For GERD 30 tablet 0  . QUEtiapine (SEROQUEL) 100 MG tablet Take 100 mg (1 tablet) at breakfast and 200 mg (2 tablets) at bed time for mood control 90 tablet 0  . sucralfate (CARAFATE) 1 GM/10ML suspension Take 10 mLs (1 g total) by mouth 4 (four) times daily -  with meals and  at bedtime. For GERD 420 mL 0  . traZODone (DESYREL) 100 MG tablet Take 1 tablet (100 mg total) by mouth at bedtime. For sleep 30 tablet 0   ROS is negative for 10 systems reviewed.   Musculoskeletal: Strength & Muscle Tone: within normal limits Gait & Station: normal Patient leans: N/A  Psychiatric Specialty Exam:   Review of Systems  Psychiatric/Behavioral: Positive for depression and suicidal ideas. The patient is nervous/anxious.   All other systems reviewed and are negative.    Blood pressure 126/71, pulse 64, temperature 98.1 F (36.7 C), temperature source Oral, resp. rate 16, SpO2 98 %.There is no weight on file to calculate BMI.  General Appearance: Casual and Fairly Groomed  Eye Contact::  Minimal  Speech:  Clear and Coherent and Pressured  Volume:  Increased  Mood:  Angry, Anxious, Depressed and Irritable  Affect:  Congruent, Depressed and Labile  Thought Process:  Coherent, Goal Directed and Intact  Orientation:  Full (Time, Place, and Person)  Thought Content:  WDL  Suicidal Thoughts:  Yes.  with intent/plan to hang self   Homicidal Thoughts:  No  Memory:  Immediate;   Good Recent;   Good Remote;   Good  Judgement:  Poor  Insight:  Shallow  Psychomotor Activity:  Normal  Concentration:  Good  Recall:  NA  Fund of Knowledge:Poor  Language: Good  Akathisia:  NA  Handed:  Right  AIMS (if indicated):     Assets:  Desire for Improvement Housing  ADL's:  Intact  Cognition: WNL  Sleep:      Medical Decision Making: Review of Psycho-Social Stressors (1), Established Problem, Worsening (2), Review of Medication Regimen & Side Effects (2) and Review of New Medication or Change in Dosage (2)  Treatment Plan Summary:  Major depressive disorder, recurrent, severe without psychotic features managed with Celexa at 40mg  daily; pt continues to worsen despite interventions and increase in medication titration; continues to warrant inpatient placement.   Plan:  [facility]  placement, waitlist  Disposition: see above  FNP-BC 01/08/2015 4:36 PM 4:36 PM

## 2015-01-09 MED ORDER — OXYCODONE-ACETAMINOPHEN 5-325 MG PO TABS: 1.0000 | ORAL_TABLET | Freq: Once | ORAL | Status: DC

## 2015-01-09 MED ORDER — TRAMADOL HCL 50 MG PO TABS: 50.0000 mg | ORAL_TABLET | Freq: Once | ORAL | Status: DC

## 2015-01-09 NOTE — ED Notes (Signed)
Pt. Noted sleeping in room. No complaints or concerns voiced. No distress or abnormal behavior noted. Will continue to monitor with security cameras. Q 15 minute rounds continue. 

## 2015-01-09 NOTE — ED Notes (Signed)
Pt reports that ice makes his knees hurt worse, encouraged to stay off his legs as much as possible. Will try heat pks for additional pain relief

## 2015-01-09 NOTE — ED Notes (Signed)
In day room watching to

## 2015-01-09 NOTE — ED Notes (Signed)
Pt. Noted in room. No complaints or concerns voiced. No distress or abnormal behavior noted. Will continue to monitor with security cameras. Q 15 minute rounds continue. 

## 2015-01-09 NOTE — ED Notes (Signed)
Up at the desk talking w/ pt.  Pleasent,cooperative.

## 2015-01-09 NOTE — ED Notes (Signed)
Up tot he bathroom to shower and change scrubs 

## 2015-01-09 NOTE — ED Notes (Signed)
Hot packs given for knees

## 2015-01-09 NOTE — ED Notes (Signed)
In day room watching tv and talkig w/ other pt

## 2015-01-09 NOTE — ED Notes (Addendum)
Pt. Noted in room. C/O insomnia and headache. No distress or abnormal behavior noted. Will continue to monitor with security cameras. Q 15 minute rounds continue.

## 2015-01-09 NOTE — Consult Note (Signed)
[facility] Face-to-Face Psychiatry Consult   Reason for Consult:  Recurrent major depressive disorder, severe, Polysubstance abuse Referring Physician:  EDP Patient Identification: Steven Barker MRN:  [id number] Principal Diagnosis: Major depressive disorder, recurrent, severe without psychotic features Diagnosis:   Patient Active Problem List   Diagnosis Date Noted  . Major depressive disorder, recurrent, severe without psychotic features [F33.2]   . Substance induced mood disorder [F19.94] 11/05/2014  . Suicidal ideation [R45.851] 11/05/2014  . Anxiety state, unspecified [F41.1] 01/01/2014  . 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 Rheumatoid arthritis [M06.9] 05/17/2013  . Polysubstance abuse [F19.10] 05/17/2013   Total Time spent with patient: 15 minutes  Subjective:   Steven Barker is a 29 y.o. male patient admitted with .Recurrent major depressive disorder, severe, Polysubstance abuse. Pt continues to present with suicidal ideation and plan to hang self. Warrants inpatient placement  HPI:  The patient continues to report he is "the same."  This is he is suicidal with a plan to overdose or hang himself.  Seger does report he can live with his father after discharge. Reviewed above note with update.  Patient spends his days in the room watching TV and Susa Simmonds comes out to ask for his needs. He states that he is still feeling suicidal and that nothing has changed.  He is in the waiting list for [facility].  We will continue to monitor patient.  01/09/15:  On rounds today patient reported his depression is on going and rated his depression 7/10 despite medication titration.  Patient endorses suicide stating" I don't know why I feel this way"  Patient is on the waiting list for [facility] and stated that he need a long term Psychiatric care.  HPI Elements:   Location:  Recurrent Major depression, severe without Psychosis, Cocaine use disorder, moderate, Marijuna  use disorder, Moderate, Benzo use disorder, severe, . Quality:  severe, suicidal with plans to hang self. Severity:  severe. Timing:  acute. Duration:  Chronic substance dependence, chronic mental illness.. Context:  seeking treatment for depression and substance use.Marland Kitchen  Past Medical History:  Past Medical History  Diagnosis Date  . Anxiety   . Depression   . Rheumatoid arthritis(714.0)    History reviewed. No pertinent past surgical history. Family History: No family history on file. Social History:  History  Alcohol Use No    Comment: last used over a month ago     History  Drug Use  . Yes  . Special: Marijuana, Cocaine    History   Social History  . Marital Status: Single    Spouse Name: N/A  . Number of Children: N/A  . Years of Education: N/A   Social History Main Topics  . Smoking status: Current Every Day Smoker -- 0.50 packs/day for 10 years    Types: Cigarettes  . Smokeless tobacco: Not on file  . Alcohol Use: No     Comment: last used over a month ago  . Drug Use: Yes    Special: Marijuana, Cocaine  . Sexual Activity: Yes    Birth Control/ Protection: None   Other Topics Concern  . None   Social History Narrative   Additional Social History:    Pain Medications: see med list Prescriptions: see med list Over the Counter: see med list History of alcohol / drug use?: Yes Longest period of sobriety (when/how long):  (unknown) Negative Consequences of Use: Financial, Personal relationships, Armed forces operational officer, Work / School Withdrawal Symptoms:  (na-pt denies) Name of  Substance 1: THC 1 - Age of First Use: 12 1 - Amount (size/oz): 3 blunts 1 - Frequency: Weekly 1 - Duration: 15+ years 1 - Last Use / Amount: 3 days ago-1 blunt Name of Substance 2: Etoh 2 - Age of First Use: 21 2 - Amount (size/oz): 12 beers 2 - Frequency: "I'll drink a couple of days in a row" 2 - Duration: 8 years 2 - Last Use / Amount: "I've been sober for about a month now" Name of  Substance 3: Cocaine 3 - Age of First Use: 22 3 - Amount (size/oz): "small amount" 3 - Frequency: 1-2x per month 3 - Duration: Past 6 years, intermittently 3 - Last Use / Amount: 3 days ago - 1 line               Allergies:   Allergies  Allergen Reactions  . Clindamycin/Lincomycin Nausea And Vomiting    Patient stated that he felt like he was going to die    Labs:  No results found for this or any previous visit (from the past 48 hour(s)).  Vitals: Blood pressure 110/74, pulse 77, temperature 98 F (36.7 C), temperature source Oral, resp. rate 18, SpO2 100 %.  Risk to Self: Suicidal Ideation: Yes-Currently Present Suicidal Intent: Yes-Currently Present Is patient at risk for suicide?: Yes Suicidal Plan?: Yes-Currently Present Specify Current Suicidal Plan: plan to hang himself Access to Means: Yes Specify Access to Suicidal Means: can get material to hang self What has been your use of drugs/alcohol within the last 12 months?: pt reports marijuana and cocaine use How many times?: 7 Other Self Harm Risks: na - pt denies Triggers for Past Attempts: Family contact, Other personal contacts Intentional Self Injurious Behavior: None Risk to Others: Homicidal Ideation: No Thoughts of Harm to Others: No Current Homicidal Intent: No Current Homicidal Plan: No Access to Homicidal Means: No Identified Victim: na - pt denies History of harm to others?: No Assessment of Violence: In past 6-12 months Violent Behavior Description: verbal aggression, punches holes in walls Does patient have access to weapons?: No Criminal Charges Pending?: No Describe Pending Criminal Charges: unknown Does patient have a court date: No Court Date:  (na) Prior Inpatient Therapy: Prior Inpatient Therapy: Yes Prior Therapy Dates: 2014-2016 Prior Therapy Facilty/Provider(s): [facility], [facility] Reason for Treatment: SI Prior Outpatient Therapy: Prior Outpatient Therapy: Yes Prior Therapy Dates:  Ongoing Prior Therapy Facilty/Provider(s): [facility] Reason for Treatment: Depression Does patient have an ACCT team?: No Does patient have Intensive In-House Services?  : No Does patient have [facility] services? : No Does patient have P4CC services?: No  Current Facility-Administered Medications  Medication Dose Route Frequency Provider Last Rate Last Dose  . acetaminophen (TYLENOL) tablet 650 mg  650 mg Oral Q6H PRN Earney Navy, NP   650 mg at 01/09/15 1535  . citalopram (CELEXA) tablet 40 mg  40 mg Oral Daily Earney Navy, NP   40 mg at 01/09/15 0733  . gabapentin (NEURONTIN) capsule 400 mg  400 mg Oral TID Lorre Nick, MD   400 mg at 01/09/15 1535  . hydrOXYzine (ATARAX/VISTARIL) tablet 50 mg  50 mg Oral Q6H PRN Earney Navy, NP   50 mg at 01/09/15 1052  . nicotine (NICODERM CQ - dosed in mg/24 hours) patch 21 mg  21 mg Transdermal Daily Charm Rings, NP   21 mg at 01/09/15 0806  . oxyCODONE-acetaminophen (PERCOCET/ROXICET) 5-325 MG per tablet 1 tablet  1 tablet Oral Once Forrest  Romeo Apple, MD   1 tablet at 01/09/15 [phone]  . pantoprazole (PROTONIX) EC tablet 20 mg  20 mg Oral Daily Lorre Nick, MD   20 mg at 01/09/15 1052  . QUEtiapine (SEROQUEL) tablet 100 mg  100 mg Oral q morning - 10a Kerry Hough, PA-C   100 mg at 01/09/15 3335  . QUEtiapine (SEROQUEL) tablet 200 mg  200 mg Oral QHS Kerry Hough, PA-C   200 mg at 01/08/15 2129  . sucralfate (CARAFATE) 1 GM/10ML suspension 1 g  1 g Oral TID WC & HS Lorre Nick, MD   1 g at 01/09/15 1318  . traZODone (DESYREL) tablet 100 mg  100 mg Oral QHS Lorre Nick, MD   100 mg at 01/08/15 2129   Current Outpatient Prescriptions  Medication Sig Dispense Refill  . citalopram (CELEXA) 20 MG tablet Take 1 tablet (20 mg total) by mouth daily. 30 tablet 0  . gabapentin (NEURONTIN) 400 MG capsule Take 1 capsule (400 mg total) by mouth 3 (three) times daily. 90 capsule 0  . OLANZapine zydis (ZYPREXA) 5 MG disintegrating  tablet Take 1 tablet (5 mg total) by mouth 2 (two) times daily as needed (Agitation). 30 tablet 0  . pantoprazole (PROTONIX) 20 MG tablet Take 1 tablet (20 mg total) by mouth daily. For GERD 30 tablet 0  . QUEtiapine (SEROQUEL) 100 MG tablet Take 100 mg (1 tablet) at breakfast and 200 mg (2 tablets) at bed time for mood control 90 tablet 0  . sucralfate (CARAFATE) 1 GM/10ML suspension Take 10 mLs (1 g total) by mouth 4 (four) times daily -  with meals and at bedtime. For GERD 420 mL 0  . traZODone (DESYREL) 100 MG tablet Take 1 tablet (100 mg total) by mouth at bedtime. For sleep 30 tablet 0   ROS is negative for 10 systems reviewed.   Musculoskeletal: Strength & Muscle Tone: within normal limits Gait & Station: normal Patient leans: N/A  Psychiatric Specialty Exam:   ROS   Blood pressure 110/74, pulse 77, temperature 98 F (36.7 C), temperature source Oral, resp. rate 18, SpO2 100 %.There is no weight on file to calculate BMI.  General Appearance: Casual and Fairly Groomed  Eye Contact::  Minimal  Speech:  Clear and Coherent and Pressured  Volume:  Increased  Mood:  Angry, Anxious, Depressed and Irritable  Affect:  Congruent, Depressed and Labile  Thought Process:  Coherent, Goal Directed and Intact  Orientation:  Full (Time, Place, and Person)  Thought Content:  WDL  Suicidal Thoughts:  Yes.  with intent/plan to hang self   Homicidal Thoughts:  No  Memory:  Immediate;   Good Recent;   Good Remote;   Good  Judgement:  Poor  Insight:  Shallow  Psychomotor Activity:  Normal  Concentration:  Good  Recall:  NA  Fund of Knowledge:Poor  Language: Good  Akathisia:  NA  Handed:  Right  AIMS (if indicated):     Assets:  Desire for Improvement Housing  ADL's:  Intact  Cognition: WNL  Sleep:      Medical Decision Making: Review of Psycho-Social Stressors (1), Established Problem, Worsening (2), Review of Medication Regimen & Side Effects (2) and Review of New Medication or  Change in Dosage (2)  Treatment Plan Summary:  Major depressive disorder, recurrent, severe without psychotic features managed with Celexa at 40mg  daily; pt continues to worsen despite interventions and increase in medication titration; continues to warrant inpatient placement. He remains on [facility]  placement.  Plan:  [facility] placement, waitlist  Disposition: see above  Earney Navy PMHNP--BC 01/09/2015 4:47 PM 4:47 PM

## 2015-01-10 DIAGNOSIS — R45851 Suicidal ideations: Secondary | ICD-10-CM | POA: Diagnosis not present

## 2015-01-10 DIAGNOSIS — F332 Major depressive disorder, recurrent severe without psychotic features: Secondary | ICD-10-CM | POA: Diagnosis not present

## 2015-01-10 DIAGNOSIS — F1910 Other psychoactive substance abuse, uncomplicated: Secondary | ICD-10-CM | POA: Diagnosis not present

## 2015-01-10 MED ORDER — DIPHENHYDRAMINE HCL 50 MG/ML IJ SOLN
INTRAMUSCULAR | Status: AC
  Filled 2015-01-10: qty 1

## 2015-01-10 MED ORDER — DIPHENHYDRAMINE HCL 50 MG/ML IJ SOLN
50.0000 mg | Freq: Once | INTRAMUSCULAR | Status: AC
  Administered 2015-01-10: 50 mg via INTRAMUSCULAR

## 2015-01-10 MED ORDER — ZIPRASIDONE MESYLATE 20 MG IM SOLR
INTRAMUSCULAR | Status: AC
Start: 2015-01-10 — End: 2015-01-10
  Filled 2015-01-10: qty 20

## 2015-01-10 MED ORDER — ZIPRASIDONE MESYLATE 20 MG IM SOLR
10.0000 mg | Freq: Once | INTRAMUSCULAR | Status: AC
  Administered 2015-01-10: 10 mg via INTRAMUSCULAR

## 2015-01-10 NOTE — ED Notes (Signed)
Patient up and more cooperative this afternoon.  I had a long discussion with patient about options and what is available for him.  I also reminded him that he also needs to put forth some effort.  He keeps going back to his environment and when I suggested that he change his environment he came up with more excuses.  He did eventually agree that he needed to make some changes and told me he would think about things tonight.

## 2015-01-10 NOTE — ED Notes (Signed)
Pt. Noted sleeping in room. No complaints or concerns voiced. No distress or abnormal behavior noted. Will continue to monitor with security cameras. Q 15 minute rounds continue. 

## 2015-01-10 NOTE — [facility] Counselor (Signed)
Accepted to 407-1 per Thurman Coyer.   Kateri Plummer, M.S., LPCA, [place], [facility] Licensed Professional Counselor Associate  Triage Specialist  [facility]  Therapeutic Triage Services Phone: [phone] Fax: [phone]

## 2015-01-10 NOTE — [facility] Assessment (Signed)
[facility] Assessment Progress Note  Per Barbara at [facility] at 08:42, pt remains on their wait list.  Dickey Caamano, MA Triage Specialist [phone]     

## 2015-01-10 NOTE — ED Notes (Signed)
Patient was told this morning that he would not be going to [facility] as previously planned as he does not meet criteria for admission.  Patient was offered intensive outpatient treatment, which would likely give him more support and services than he would get in the hospital.  Patient became angry and pulled open the hall closet and retrieved his duffle bag.  He marched toward the exit door and started yelling to let him out.  He reached into his bag and the other RN grabbed the bag from him as we had no way of knowing what he was reaching for.  We discussed options with the MD and were given orders for Geodon and Benadryl as the doctor felt he was too upset to leave at this time.  He went back to his room willingly and the injections were given.  He has been sleeping since that time.

## 2015-01-10 NOTE — ED Notes (Signed)
Report called to RN Rosemarie Beath, [facility], Pending [place] transport.
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
## 2015-01-10 NOTE — ED Notes (Signed)
Pt accepted to [facility], Dr Jama Flavors, [address] hall, pending transfer between 9-10pm.

## 2015-01-11 ENCOUNTER — Inpatient Hospital Stay (HOSPITAL_COMMUNITY)
Admission: AD | Admit: 2015-01-11 | Discharge: 2015-01-18 | DRG: 885 | Disposition: A | Discharge status: Home / Self Care | Payer: Federal, State, Local not specified - Other | Source: Intra-hospital | Attending: Psychiatry | Admitting: Psychiatry

## 2015-01-11 ENCOUNTER — Encounter (HOSPITAL_COMMUNITY): Payer: Self-pay | Admitting: Emergency Medicine

## 2015-01-11 DIAGNOSIS — F39 Unspecified mood [affective] disorder: Secondary | ICD-10-CM | POA: Diagnosis not present

## 2015-01-11 DIAGNOSIS — F17210 Nicotine dependence, cigarettes, uncomplicated: Secondary | ICD-10-CM | POA: Diagnosis present

## 2015-01-11 DIAGNOSIS — F1320 Sedative, hypnotic or anxiolytic dependence, uncomplicated: Secondary | ICD-10-CM | POA: Insufficient documentation

## 2015-01-11 DIAGNOSIS — F4310 Post-traumatic stress disorder, unspecified: Secondary | ICD-10-CM | POA: Diagnosis present

## 2015-01-11 DIAGNOSIS — F1994 Other psychoactive substance use, unspecified with psychoactive substance-induced mood disorder: Secondary | ICD-10-CM | POA: Diagnosis present

## 2015-01-11 DIAGNOSIS — R45851 Suicidal ideations: Secondary | ICD-10-CM | POA: Diagnosis not present

## 2015-01-11 DIAGNOSIS — G4700 Insomnia, unspecified: Secondary | ICD-10-CM | POA: Diagnosis present

## 2015-01-11 DIAGNOSIS — F411 Generalized anxiety disorder: Secondary | ICD-10-CM | POA: Diagnosis present

## 2015-01-11 DIAGNOSIS — K219 Gastro-esophageal reflux disease without esophagitis: Secondary | ICD-10-CM | POA: Diagnosis present

## 2015-01-11 DIAGNOSIS — F419 Anxiety disorder, unspecified: Secondary | ICD-10-CM | POA: Diagnosis not present

## 2015-01-11 DIAGNOSIS — F332 Major depressive disorder, recurrent severe without psychotic features: Secondary | ICD-10-CM | POA: Diagnosis present

## 2015-01-11 DIAGNOSIS — F410 Panic disorder [episodic paroxysmal anxiety] without agoraphobia: Secondary | ICD-10-CM | POA: Diagnosis present

## 2015-01-11 DIAGNOSIS — M069 Rheumatoid arthritis, unspecified: Secondary | ICD-10-CM | POA: Diagnosis present

## 2015-01-11 DIAGNOSIS — R45850 Homicidal ideations: Secondary | ICD-10-CM | POA: Diagnosis present

## 2015-01-11 MED ORDER — ALUM & MAG HYDROXIDE-SIMETH 200-200-20 MG/5ML PO SUSP
30.0000 mL | ORAL | Status: DC | PRN
Start: 2015-01-11 — End: 2015-01-18

## 2015-01-11 MED ORDER — SUCRALFATE 1 GM/10ML PO SUSP
1.0000 g | Freq: Three times a day (TID) | ORAL | Status: DC
  Administered 2015-01-11 – 2015-01-18 (×29): 1 g via ORAL
  Filled 2015-01-11 (×34): qty 10

## 2015-01-11 MED ORDER — TRAZODONE HCL 100 MG PO TABS: 100.0000 mg | ORAL_TABLET | Freq: Every day | ORAL | Status: DC

## 2015-01-11 MED ORDER — PANTOPRAZOLE SODIUM 20 MG PO TBEC
20.0000 mg | DELAYED_RELEASE_TABLET | Freq: Every day | ORAL | Status: DC
  Administered 2015-01-11 – 2015-01-18 (×8): 20 mg via ORAL
  Filled 2015-01-11 (×10): qty 1

## 2015-01-11 MED ORDER — TRAZODONE HCL 100 MG PO TABS
100.0000 mg | ORAL_TABLET | Freq: Every evening | ORAL | Status: DC | PRN
  Administered 2015-01-11 (×2): 100 mg via ORAL
  Filled 2015-01-11 (×5): qty 1

## 2015-01-11 MED ORDER — NICOTINE 21 MG/24HR TD PT24
21.0000 mg | MEDICATED_PATCH | Freq: Every day | TRANSDERMAL | Status: DC
  Administered 2015-01-11 – 2015-01-18 (×8): 21 mg via TRANSDERMAL
  Filled 2015-01-11 (×11): qty 1

## 2015-01-11 MED ORDER — GABAPENTIN 400 MG PO CAPS
400.0000 mg | ORAL_CAPSULE | Freq: Three times a day (TID) | ORAL | Status: DC
Start: 2015-01-11 — End: 2015-01-12
  Administered 2015-01-11 – 2015-01-12 (×4): 400 mg via ORAL
  Filled 2015-01-11 (×8): qty 1

## 2015-01-11 MED ORDER — OLANZAPINE 5 MG PO TBDP
5.0000 mg | ORAL_TABLET | Freq: Two times a day (BID) | ORAL | Status: DC | PRN
  Administered 2015-01-11 – 2015-01-15 (×8): 5 mg via ORAL
  Filled 2015-01-11 (×8): qty 1

## 2015-01-11 MED ORDER — QUETIAPINE FUMARATE 100 MG PO TABS
100.0000 mg | ORAL_TABLET | Freq: Every morning | ORAL | Status: DC
  Administered 2015-01-11 – 2015-01-12 (×2): 100 mg via ORAL
  Filled 2015-01-11 (×3): qty 1

## 2015-01-11 MED ORDER — ACETAMINOPHEN 325 MG PO TABS
650.0000 mg | ORAL_TABLET | Freq: Four times a day (QID) | ORAL | Status: DC | PRN
  Administered 2015-01-11 – 2015-01-17 (×12): 650 mg via ORAL
  Filled 2015-01-11 (×12): qty 2

## 2015-01-11 MED ORDER — QUETIAPINE FUMARATE 200 MG PO TABS
200.0000 mg | ORAL_TABLET | Freq: Every day | ORAL | Status: DC
  Administered 2015-01-11: 200 mg via ORAL
  Filled 2015-01-11 (×3): qty 1

## 2015-01-11 MED ORDER — HYDROXYZINE HCL 50 MG PO TABS
50.0000 mg | ORAL_TABLET | Freq: Four times a day (QID) | ORAL | Status: DC | PRN
  Administered 2015-01-11 – 2015-01-14 (×7): 50 mg via ORAL
  Filled 2015-01-11 (×7): qty 1

## 2015-01-11 MED ORDER — CITALOPRAM HYDROBROMIDE 20 MG PO TABS
20.0000 mg | ORAL_TABLET | Freq: Every day | ORAL | Status: DC
  Administered 2015-01-11 – 2015-01-13 (×3): 20 mg via ORAL
  Filled 2015-01-11 (×5): qty 1

## 2015-01-11 MED ORDER — MAGNESIUM HYDROXIDE 400 MG/5ML PO SUSP: 30.0000 mL | Freq: Every day | ORAL | Status: DC | PRN

## 2015-01-11 MED ORDER — HYDROXYZINE HCL 25 MG PO TABS
25.0000 mg | ORAL_TABLET | Freq: Four times a day (QID) | ORAL | Status: DC | PRN
  Administered 2015-01-11: 25 mg via ORAL
  Filled 2015-01-11: qty 1

## 2015-01-11 NOTE — Progress Notes (Signed)
[facility] Group Notes:  (Nursing/MHT/Case Management/Adjunct)  Date:  01/11/2015  Time:  10:20 PM  Type of Therapy:  Psychoeducational Skills  Participation Level:  Active  Participation Quality:  Appropriate  Affect:  Appropriate  Cognitive:  Appropriate  Insight:  Appropriate  Engagement in Group:  Engaged  Modes of Intervention:  Discussion  Summary of Progress/Problems: In wrap up group, Steven Barker stated that his day was a 2(1-10[best] scale). The theme today was Recovery, he stated that his plans are to go through a long-term rehab treatment then halfway house.   Madaline Savage 01/11/2015, 10:20 PM

## 2015-01-11 NOTE — [facility] Suicide Risk Assessment (Signed)
[facility] Admission Suicide Risk Assessment   Nursing information obtained from:    Demographic factors:   29 year old single male, lives with GF, currently unemployed  Current Mental Status:   see below Loss Factors:   limited support, legal issues, unemployment, recent relapse  Historical Factors:   substance dependence, depression Risk Reduction Factors:   resilience  Total Time spent with patient: 45 minutes Principal Problem: Substance induced mood disorder Diagnosis:   Patient Active Problem List   Diagnosis Date Noted  . Major depressive disorder, recurrent, severe without psychotic features [F33.2]   . Substance induced mood disorder [F19.94] 11/05/2014  . Suicidal ideation [R45.851] 11/05/2014  . Anxiety state, unspecified [F41.1] 01/01/2014  . 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 Rheumatoid arthritis [M06.9] 05/17/2013  . Polysubstance abuse [F19.10] 05/17/2013     Continued Clinical Symptoms:  Alcohol Use Disorder Identification Test Final Score (AUDIT): 11 The "Alcohol Use Disorders Identification Test", Guidelines for Use in Primary Care, Second Edition.  World Science writer [facility]). Score between 0-7:  no or low risk or alcohol related problems. Score between 8-15:  moderate risk of alcohol related problems. Score between 16-19:  high risk of alcohol related problems. Score 20 or above:  warrants further diagnostic evaluation for alcohol dependence and treatment.   CLINICAL FACTORS:   29 year old man, status post overdose on Benzodiazepines 01/01/15. States he had been experiencing increased anxiety and panic, leading to BZD use and subsequent abuse. States he developed suicidal ideations due to frustration regarding relapse .    Musculoskeletal: Strength & Muscle Tone: within normal limits Gait & Station: normal Patient leans: N/A  Psychiatric Specialty Exam: Physical Exam  ROS  Blood pressure 121/89, pulse 74,  temperature 97.8 F (36.6 C), temperature source Oral, resp. rate 16, height 5\' 6"  (1.676 m), weight 148 lb (67.132 kg), SpO2 100 %.Body mass index is 23.9 kg/(m^2).  See Admit Note MSE                                                        COGNITIVE FEATURES THAT CONTRIBUTE TO RISK:  Closed-mindedness    SUICIDE RISK:   Moderate:  Frequent suicidal ideation with limited intensity, and duration, some specificity in terms of plans, no associated intent, good self-control, limited dysphoria/symptomatology, some risk factors present, and identifiable protective factors, including available and accessible social support.  PLAN OF CARE: Patient will be admitted to inpatient psychiatric unit for stabilization and safety. Will provide and encourage milieu participation. Provide medication management and maked adjustments as needed.  Will follow daily.    Medical Decision Making:  Review of Psycho-Social Stressors (1), Review or order clinical lab tests (1), Established Problem, Worsening (2) and Review of Medication Regimen & Side Effects (2)  I certify that inpatient services furnished can reasonably be expected to improve the patient's condition.   Herold Salguero 01/11/2015, 4:52 PM
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
## 2015-01-11 NOTE — Progress Notes (Signed)
Recreation Therapy Notes  Animal-Assisted Activity (AAA) Program Checklist/Progress Notes Patient Eligibility Criteria Checklist & Daily Group note for Rec Tx Intervention  Date: 01/11/15 Time: 2:30pm Location: [address]   AAA/T Program Assumption of Risk Form signed by Patient/ or Parent Legal Guardian YES   Patient is free of allergies or sever asthma YES   Patient reports no fear of animals YES  Patient reports no history of cruelty to animals YES   Patient understands his/her participation is voluntary YES   Patient washes hands before animal contact YES   Patient washes hands after animal contact YES  Behavioral Response: Appropriate, engaged  Education: Charity fundraiser, Appropriate Animal Interaction   Education Outcome: Acknowledges understanding/In group clarification offered/Needs additional education.   Clinical Observations/Feedback: Patient sat on the floor and pet the dog. Patient left group at 3:07pm.  Steven Barker, LRT, Steven Barker, Steven Barker 01/11/2015 4:27 PM

## 2015-01-11 NOTE — Progress Notes (Signed)
D:Patient in hte hallway on approach.  Patient states he had a bad day.  Patient has been intrusive tonight and had to be redirected. Patient states he does not feel he is getting the help he needs here.  Patient states his medications are not right yet.  When patient was asked what was it he felt he was not getting?, patient states he wants to go to another hallway and interact with the other patients.  Patient was instructed he had to stay on the hallway he was assigned.  Patient states he is passive SI but verbally contracts for safety.  Patient denies HI and denies AVH.   A: Staff to monitor Q 15 mins for safety.  Encouragement and support offered.  Scheduled medications administered per orders. R: Patient remains safe on the unit.  Patient attended group tonight.  Patient visible on the unit and interacting with peers.  Patient taking administered medications.

## 2015-01-11 NOTE — [facility] Group Notes (Signed)
The focus of this group is to educate the patient on the purpose and policies of crisis stabilization and provide a format to answer questions about their admission.  The group details unit policies and expectations of patients while admitted.  Patient did not attend 0900 nurse education orientation group this morning.  Patient talked with MD and came to group during the end of group.

## 2015-01-11 NOTE — Progress Notes (Signed)
Pt attended spiritual care group on grief and loss facilitated by chaplain Artemisa Sladek. Group opened with brief discussion and psycho-social ed around grief and loss in relationships and in relation to self - identifying life patterns, circumstances, changes that cause losses. Established group norm of speaking from own life experience. Group goal of establishing open and affirming space for members to share loss and experience with grief, normalize grief experience and provide psycho social education and grief support.  Group drew on narrative and Alderian therapeutic modalities.      

## 2015-01-11 NOTE — Plan of Care (Signed)
Problem: Consults Goal: Anxiety Disorder Patient Education See Patient Education Module for eduction specifics.  Outcome: Completed/Met Date Met:  01/11/15 Nurse discussed anxiety/coping skills with patient.

## 2015-01-11 NOTE — Progress Notes (Signed)
D:  Patient denied SI and HI, contracts for safety.  Denied A/V hallucinations.   A:  Medications administered per MD orders.  Emotional support and encouragement given patient. R:  Safety maintained with 15 minute checks. Patient stated he took klonipin, cocaine and alittle weed after last discharge from [facility].  Bought Xanax from friend on street.  Went in the woods, woke up in the mud.  Stated he has upcoming court date.  Would like to go to rehab facility.

## 2015-01-11 NOTE — [facility] Group Notes (Addendum)
Pt is awake and alert, report SI thoughts with plan to jump off bridge. Denies A/VH. Pt reports past attempt with medication and elicit drug use. Reports hx of mental health disorders. States he has stopped using acholic for the past 2 months.  Pt reports he recv Geodon this morning due to he wasn't able to "vap" and none to the "staff in the emergency dept was paying me attention" Will continue to monitor for safety.

## 2015-01-11 NOTE — Tx Team (Signed)
Interdisciplinary Treatment Plan Update (Adult)  Date:  01/11/2015  Time Reviewed:  8:33 AM   Progress in Treatment: Attending groups: Patient is attending groups. Participating in groups:  Patient engages in discussion Taking medication as prescribed:  Patient is taking medications Tolerating medication:  Patient is tolerating medications Family/Significant othe contact made:   No, will asked for consent to make collateral contact Patient understands diagnosis:Yes, patient understands diagnosis and need for treatment Discussing patient identified problems/goals with staff:  Yes, patient is able to express goals/problems Medical problems stabilized or resolved:  Yes Denies suicidal/homicidal ideation: Yes, patient is denying SI/HI. Issues/concerns per patient self-inventory:   Other:   Discharge Plan or Barriers:  Reason for Continuation of Hospitalization: Anxiety Depression Medication stabilization Suicidal ideation  Comments:  Steven Barker is an 29 y.o. male that is self-referred to [facility] reporting SI with plan to hang himself. Pt stated he took "a handful of Xanax" 3 days ago to kill himself, "but it didn't work, I just wound up in a ditch." Pt continues to endorse SI with plan. Pt was recently discharged from [facility] on 12/23/14 for similar sx. Pt denies HI or AVH. No delusions noted. Pt stated, "I don't know how to explain it. I can't put it into words." Pt reports he has used marijuana and cocaine once 3 days ago when he took the Xanax in an attempt to kill himself. Pt stated he is taking his meds as prescribed from [facility]. However, pt doesn't feel the medications are working. Pt cooperative, oriented x 4, had depressed mood and affect, good eye contact, logical/coherent thought processes and normal speech.  Additional comments:  Patient and CSW reviewed Patient Discharge Process Letter/Patient Involvement Form.  Patient verbalized understanding and signed form.  Patient and  CSW also reviewed and identified patient's goals and treatment plan.  Patient verbalized understanding and agreed to plan.  Estimated length of stay: 3-5 days New goal(s):  Review of initial/current patient goals per problem list:     Attendees: Patient 01/11/2015 8:33 AM   Family:   01/11/2015 8:33 AM   Physician:  Nehemiah Massed, MD 01/11/2015 8:33 AM   Nursing:   Quintella Reichert, RN 01/11/2015 8:33 AM   Clinical Social Worker:  Juline Patch, LCSW 01/11/2015 8:33 AM   Clinical Social Worker:  Samuella Bruin, LCSW-A 01/11/2015 8:33 AM   Case Manager:  Onnie Boer, RN 01/11/2015 8:33 AM   Other:  Liborio Nixon, RN 01/11/2015 8:33 AM  Other:   01/11/2015  8:33 AM   Other:  01/11/2015 8:33 AM   Other:  01/11/2015 8:33 AM   Other:  01/11/2015 8:33 AM   Other:  Chad Cordial Transition Team Coordinator 01/11/2015 8:33 AM   Other:   01/11/2015 8:33 AM   Other:  01/11/2015 8:33 AM   Other:   01/11/2015 8:33 AM    Scribe for Treatment Team:   [name], 01/11/2015   8:33 AM

## 2015-01-11 NOTE — Tx Team (Signed)
Initial Interdisciplinary Treatment Plan   PATIENT STRESSORS: Substance abuse   PATIENT STRENGTHS: Communication skills   PROBLEM LIST: Problem List/Patient Goals Date to be addressed Date deferred Reason deferred Estimated date of resolution  Substance abuse 01/11/15     anxitey 01/11/15     Suicide risk 01/11/15                                          DISCHARGE CRITERIA:  Adequate post-discharge living arrangements Reduction of life-threatening or endangering symptoms to within safe limits  PRELIMINARY DISCHARGE PLAN: =  PATIENT/FAMIILY INVOLVEMENT: This treatment plan has been presented to and reviewed with the patient, Steven Barker, and/or family member.  The patient and family have been given the opportunity to ask questions and make suggestions.  Steven Barker 01/11/2015, 2:03 AM

## 2015-01-11 NOTE — H&P (Addendum)
Psychiatric Admission Assessment Adult  Patient Identification: Steven Barker MRN:  [id number] Date of Evaluation:  01/11/2015 Chief Complaint:  " I overdosed"  Principal Diagnosis: <principal problem not specified> Diagnosis:   Patient Active Problem List   Diagnosis Date Noted  . Major depressive disorder, recurrent, severe without psychotic features [F33.2]   . Substance induced mood disorder [F19.94] 11/05/2014  . Suicidal ideation [R45.851] 11/05/2014  . Anxiety state, unspecified [F41.1] 01/01/2014  . 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 Rheumatoid arthritis [M06.9] 05/17/2013  . Polysubstance abuse [F19.10] 05/17/2013   History of Present Illness:: 29 year old man, reports a history of substance dependence.  He states he had been sober for a period of time but had been experiencing panic attacks. Because of this he impulsively took some Klonopins from a friend, and later " bought  A bunch of Xanaxes ".  He states he has a history of cocaine abuse, but " bought xanax this time". He took about 15 tablets of Xanax. States that at the time he wanted to " get rid of the anxiety and the panic" but had also developed some suicidal  Ideations related  In part to frustration over relapsing, so he did not care if " I died or not". Of note, Xanax overdose was  Several days ago, and patient states he was not using BZDs consistently recently- there are no current symptoms of BZD withdrawal. He states that after this overdose " I went into a blackout- I can't remember what happened. My brother states I stripped down naked on the street ". Patient was brought to hospital and endorsed suicidal ideations, resulting in admission. He reports depression, anxiety , and PTSD symptoms, stemming from a prior overdose which led to aspiration and prolonged ICU treatment/intubation. States he still has intrusive memories, recollections, and nightmares about  this.  Elements:   Recent benzodiazepine overdose and suicidal ideations in the context of anxiety/panic attacks and recent relapse . Associated Signs/Symptoms: Depression Symptoms:  depressed mood, anhedonia, insomnia, feelings of worthlessness/guilt, recurrent thoughts of death, loss of energy/fatigue, reports he has gained weight, as his appetite has improved after he stopped using cocaine (Hypo) Manic Symptoms:  Does not endorse Anxiety Symptoms:  (+) subjective sense of anxiety, as well as recent panic attacks  Psychotic Symptoms: Does not endorse  PTSD Symptoms: As noted, endorses PTSD symptoms from history of overdose, aspiration and prolonged intubation period in the past. Total Time spent with patient: 45 minutes   Past Psychiatric History-  Patient states he has a history of depression, anxiety, panic attacks , PTSD as above. Denies history of mania, hypomania, denies history of psychosis. Has had prior psychiatric admissions , and has attempted suicide  Several times in the past , by overdosing . Patient had a recent psychiatric admission on our Unit from 4/28- 5/4 /16. At the time he was diagnosed with MDD , Substance Induced Mood Disorder, was discharged on Seroquel, Celexqa, Neurontin  Past Medical History:  States he has been tested negative for Hep C/B and HIV in the recent past. Past Medical History  Diagnosis Date  . Anxiety   . Depression   . Rheumatoid arthritis(714.0)    History reviewed. No pertinent past surgical history. Family History: States there is a history of mental illness in family, mother had bipolar disorder, one brother has substance abuse issues , grandfather may have committed suicide  Social History:  Patient lives with GF- states their relationship is good and  that she is supportive of his recovery. Has no children, but GF has three children, currently unemployed. Has an upcoming court case for DWI. History  Alcohol Use No    Comment: last used  over a month ago     History  Drug Use  . Yes  . Special: Marijuana, Cocaine    History   Social History  . Marital Status: Single    Spouse Name: N/A  . Number of Children: N/A  . Years of Education: N/A   Social History Main Topics  . Smoking status: Current Every Day Smoker -- 0.50 packs/day for 10 years    Types: Cigarettes  . Smokeless tobacco: Not on file  . Alcohol Use: No     Comment: last used over a month ago  . Drug Use: Yes    Special: Marijuana, Cocaine  . Sexual Activity: Yes    Birth Control/ Protection: None   Other Topics Concern  . None   Social History Narrative   Additional Social History:      Name of Substance 1: Cocain 1 - Age of First Use: 17 1 - Frequency: often 1 - Last Use / Amount: 1 week ago Name of Substance 2: Xanax 2 - Age of First Use: 17 2 - Frequency: often 2 - Last Use / Amount: 1 week ago    Musculoskeletal: Strength & Muscle Tone: within normal limits Gait & Station: normal Patient leans: N/A  Psychiatric Specialty Exam: Physical Exam  Review of Systems  Constitutional: Negative for fever, chills and weight loss.  HENT: Negative.   Eyes: Negative.   Respiratory: Negative.   Cardiovascular: Negative.   Gastrointestinal: Negative.        History of GERD , melenas in the past, but denies any current melenas or GI pain  Genitourinary: Negative.   Musculoskeletal: Negative.   Skin: Negative.   Neurological: Negative.   Endo/Heme/Allergies: Negative.   Psychiatric/Behavioral: Positive for depression, suicidal ideas and substance abuse.  all other systems negative   Blood pressure 121/89, pulse 74, temperature 97.8 F (36.6 C), temperature source Oral, resp. rate 16, height 5\' 6"  (1.676 m), weight 148 lb (67.132 kg), SpO2 100 %.Body mass index is 23.9 kg/(m^2).  General Appearance: Fairly Groomed  Patent attorney::  Good  Speech:  Normal Rate  Volume:  Normal  Mood:  Anxious and Depressed  Affect:  Constricted   Thought Process:  Linear  Orientation:  Other:  fully alert and attentive   Thought Content:  denies current hallucinations, does not appear internally preoccupied   Suicidal Thoughts:  No at this time denies any thoughts of hurting self and  contracts for safety on unit   Homicidal Thoughts:  No  Memory:  Recent and remote fair- memories of events that led to admission very limited due to BZD related blackout   Judgement:  Fair  Insight:  Fair  Psychomotor Activity:  Normal  Concentration:  Good  Recall:  Fair  Fund of Knowledge:Good  Language: Good  Akathisia:  Negative  Handed:  Right  AIMS (if indicated):     Assets:  Desire for Improvement Resilience  ADL's:  Impaired  Cognition: WNL  Sleep:      Risk to Self: Is patient at risk for suicide?: Yes What has been your use of drugs/alcohol within the last 12 months?: Patient reports using up to two grams of ccaine daily ; five to six Xanax and smoking five/six blunts per day Risk to Others:  Prior Inpatient Therapy:   Prior Outpatient Therapy:    Alcohol Screening: Patient refused Alcohol Screening Tool: Yes 1. How often do you have a drink containing alcohol?: 2 to 4 times a month 2. How many drinks containing alcohol do you have on a typical day when you are drinking?: 5 or 6 3. How often do you have six or more drinks on one occasion?: Less than monthly Preliminary Score: 3 4. How often during the last year have you found that you were not able to stop drinking once you had started?: Less than monthly 5. How often during the last year have you failed to do what was normally expected from you becasue of drinking?: Less than monthly 6. How often during the last year have you needed a first drink in the morning to get yourself going after a heavy drinking session?: Monthly 7. How often during the last year have you had a feeling of guilt of remorse after drinking?: Less than monthly 8. How often during the last year have you  been unable to remember what happened the night before because you had been drinking?: Less than monthly 9. Have you or someone else been injured as a result of your drinking?: No 10. Has a relative or friend or a doctor or another health worker been concerned about your drinking or suggested you cut down?: No Alcohol Use Disorder Identification Test Final Score (AUDIT): 11 Brief Intervention:  (pt reports he no longer drinks at this time and had quit drinking 2 months ago)  Allergies:   Allergies  Allergen Reactions  . Clindamycin/Lincomycin Nausea And Vomiting    Patient stated that he felt like he was going to die   Lab Results: No results found for this or any previous visit (from the past 48 hour(s)). Current Medications: Current Facility-Administered Medications  Medication Dose Route Frequency Provider Last Rate Last Dose  . acetaminophen (TYLENOL) tablet 650 mg  650 mg Oral Q6H PRN Kerry Hough, PA-C   650 mg at 01/11/15 0936  . alum & mag hydroxide-simeth (MAALOX/MYLANTA) 200-200-20 MG/5ML suspension 30 mL  30 mL Oral Q4H PRN Kerry Hough, PA-C      . citalopram (CELEXA) tablet 20 mg  20 mg Oral Daily Kerry Hough, PA-C   20 mg at 01/11/15 0805  . gabapentin (NEURONTIN) capsule 400 mg  400 mg Oral TID Kerry Hough, PA-C   400 mg at 01/11/15 1207  . hydrOXYzine (ATARAX/VISTARIL) tablet 25 mg  25 mg Oral Q6H PRN Kerry Hough, PA-C   25 mg at 01/11/15 1045  . magnesium hydroxide (MILK OF MAGNESIA) suspension 30 mL  30 mL Oral Daily PRN Kerry Hough, PA-C      . nicotine (NICODERM CQ - dosed in mg/24 hours) patch 21 mg  21 mg Transdermal Daily Kerry Hough, PA-C   21 mg at 01/11/15 0805  . OLANZapine zydis (ZYPREXA) disintegrating tablet 5 mg  5 mg Oral BID PRN Kerry Hough, PA-C   5 mg at 01/11/15 1045  . pantoprazole (PROTONIX) EC tablet 20 mg  20 mg Oral Daily Kerry Hough, PA-C   20 mg at 01/11/15 0804  . QUEtiapine (SEROQUEL) tablet 100 mg  100 mg Oral q  morning - 10a Kerry Hough, PA-C   100 mg at 01/11/15 0934  . QUEtiapine (SEROQUEL) tablet 200 mg  200 mg Oral QHS Kerry Hough, PA-C   200 mg at 01/11/15 0252  .  sucralfate (CARAFATE) 1 GM/10ML suspension 1 g  1 g Oral TID WC & HS Kerry Hough, PA-C   1 g at 01/11/15 1207  . traZODone (DESYREL) tablet 100 mg  100 mg Oral QHS,MR X 1 Spencer E Simon, PA-C   100 mg at 01/11/15 0120   PTA Medications: Prescriptions prior to admission  Medication Sig Dispense Refill Last Dose  . citalopram (CELEXA) 20 MG tablet Take 1 tablet (20 mg total) by mouth daily. 30 tablet 0 01/02/2015 at Unknown time  . gabapentin (NEURONTIN) 400 MG capsule Take 1 capsule (400 mg total) by mouth 3 (three) times daily. 90 capsule 0 01/02/2015 at Unknown time  . OLANZapine zydis (ZYPREXA) 5 MG disintegrating tablet Take 1 tablet (5 mg total) by mouth 2 (two) times daily as needed (Agitation). 30 tablet 0 01/02/2015 at Unknown time  . pantoprazole (PROTONIX) 20 MG tablet Take 1 tablet (20 mg total) by mouth daily. For GERD 30 tablet 0 01/02/2015 at Unknown time  . QUEtiapine (SEROQUEL) 100 MG tablet Take 100 mg (1 tablet) at breakfast and 200 mg (2 tablets) at bed time for mood control 90 tablet 0 01/02/2015 at 1000  . sucralfate (CARAFATE) 1 GM/10ML suspension Take 10 mLs (1 g total) by mouth 4 (four) times daily -  with meals and at bedtime. For GERD 420 mL 0 01/02/2015 at Unknown time  . traZODone (DESYREL) 100 MG tablet Take 1 tablet (100 mg total) by mouth at bedtime. For sleep 30 tablet 0 01/01/2015 at Unknown time    Previous Psychotropic Medications:  As above   Substance Abuse History in the last 12 months:  Yes.  - patient identifies cocaine as prior treatment of choice, but most recent relapse was on Benzodiazepines as above     Consequences of Substance Abuse: blackout, social stressors, legal issues   No results found for this or any previous visit (from the past 72 hour(s)).  Observation Level/Precautions:  15  minute checks  Laboratory:  As needed - will not order HgBA1C as there is a value from April /16. Will order routine Lipid panel as on Seroquel.  Psychotherapy:  Milieu, therapy  Medications:  Continue Celexa for depression, Neurontin for pain/anxiety, Seroquel for Mood Disorder, Anxiety, insomnia, continue Protonix and Carafate for history of GERD   Consultations:  As needed   Discharge Concerns:  Limited sober support network  Estimated LOS: 6 days   Other:     Psychological Evaluations: no   Treatment Plan Summary: Daily contact with patient to assess and evaluate symptoms and progress in treatment, Medication management, Plan inpatient psychiatric admission and medications as above   Medical Decision Making:  Review of Psycho-Social Stressors (1), Review or order clinical lab tests (1), Established Problem, Worsening (2) and Review of Medication Regimen & Side Effects (2)  I certify that inpatient services furnished can reasonably be expected to improve the patient's condition.   Nehemiah Massed 5/17/20164:24 PM

## 2015-01-11 NOTE — [facility] Suicide Risk Assessment (Signed)
[facility] INPATIENT:  Family/Significant Other Suicide Prevention Education  Suicide Prevention Education:  Patient Refusal for Family/Significant Other Suicide Prevention Education: The patient Steven Barker has refused to provide written consent for family/significant other to be provided Family/Significant Other Suicide Prevention Education during admission and/or prior to discharge.  Physician notified.  [name] 01/11/2015, 11:19 AM

## 2015-01-12 LAB — LIPID PANEL
CHOL/HDL RATIO: 3.7 ratio
CHOLESTEROL: 168 mg/dL (ref 0–200)
HDL: 45 mg/dL (ref >40)
LDL Cholesterol: 78 mg/dL (ref 0–99)
TRIGLYCERIDES: 227 mg/dL — AB (ref <150)
VLDL: 45 mg/dL — AB (ref 0–40)

## 2015-01-12 MED ORDER — QUETIAPINE FUMARATE 300 MG PO TABS
150.0000 mg | ORAL_TABLET | Freq: Every morning | ORAL | Status: DC
  Administered 2015-01-13 – 2015-01-18 (×6): 150 mg via ORAL
  Filled 2015-01-12 (×8): qty 3

## 2015-01-12 MED ORDER — QUETIAPINE FUMARATE 100 MG PO TABS
250.0000 mg | ORAL_TABLET | Freq: Every day | ORAL | Status: DC
  Administered 2015-01-12: 250 mg via ORAL
  Filled 2015-01-12 (×2): qty 2.5

## 2015-01-12 MED ORDER — GABAPENTIN 300 MG PO CAPS
600.0000 mg | ORAL_CAPSULE | Freq: Three times a day (TID) | ORAL | Status: DC
  Administered 2015-01-12 – 2015-01-15 (×10): 600 mg via ORAL
  Filled 2015-01-12 (×16): qty 2

## 2015-01-12 NOTE — Progress Notes (Signed)
[facility] MD Progress Note  01/12/2015 2:36 PM Elpidio Thielen  MRN:  [id number] Subjective:   Patient reports he continues to feel depressed, vaguely anxious, and states his sleep has been fragmented. He states " I do not think I am on enough medications", and in particular feels that Seroquel dose is insufficient at this time. States " I like Seroquel and I like Neurontin- they help me be more calm, and more relaxed, less paranoid".  Denies medication side effects. At this time remains focused on going to an inpatient rehab after discharge. Objective : I have discussed case with staff and have met with patient. Patient has been depressed, dysphoric, and vaguely agitated, although redirectable, at times . Some group participation.  Patient states " I know it is harder to get into a Rehab if there is any legal issue going on , but I have spoken with my lawyer and he states I could go to a Rehab, because case will be continued". He presents improved compared to admission, but still dysphoric, somewhat depressed, and reporting subjective anxiety. Describes some passive SI, but denies plan or intention of hurting self and contracts for safety on unit. Tolerating medications well, denies side effects- no akathisia, no dystonia noted .  Lipid panel - mild hypertriglyceridemia .Principal Problem: Substance induced mood disorder Diagnosis:   Patient Active Problem List   Diagnosis Date Noted  . Major depressive disorder, recurrent, severe without psychotic features [F33.2]   . Substance induced mood disorder [F19.94] 11/05/2014  . Suicidal ideation [R45.851] 11/05/2014  . Anxiety state, unspecified [F41.1] 01/01/2014  . 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 Rheumatoid arthritis [M06.9] 05/17/2013  . Polysubstance abuse [F19.10] 05/17/2013   Total Time spent with patient: 25 minutes    Past Medical History:  Past Medical History  Diagnosis Date  . Anxiety    . Depression   . Rheumatoid arthritis(714.0)    History reviewed. No pertinent past surgical history. Family History: History reviewed. No pertinent family history. Social History:  History  Alcohol Use No    Comment: last used over a month ago     History  Drug Use  . Yes  . Special: Marijuana, Cocaine    History   Social History  . Marital Status: Single    Spouse Name: N/A  . Number of Children: N/A  . Years of Education: N/A   Social History Main Topics  . Smoking status: Current Every Day Smoker -- 0.50 packs/day for 10 years    Types: Cigarettes  . Smokeless tobacco: Not on file  . Alcohol Use: No     Comment: last used over a month ago  . Drug Use: Yes    Special: Marijuana, Cocaine  . Sexual Activity: Yes    Birth Control/ Protection: None   Other Topics Concern  . None   Social History Narrative   Additional History:    Sleep:  Fair   Appetite: fair    Assessment:   Musculoskeletal: Strength & Muscle Tone: within normal limits Gait & Station: normal Patient leans: N/A   Psychiatric Specialty Exam: Physical Exam  ROS- no akathisia, no abnormal involuntary movements, denies excessive sedation, denies nausea, vomiting.  Blood pressure 124/89, pulse 73, temperature 97.6 F (36.4 C), temperature source Oral, resp. rate 16, height _0  (1.676 m), weight 148 lb (67.132 kg), SpO2 100 %.Body mass index is 23.9 kg/(m^2).  General Appearance: Fairly Groomed  Engineer, water::  Good  Speech:  Normal  Rate  Volume:  Normal  Mood:  depressed, dysphoric   Affect:  Congruent  Thought Process:  Goal Directed and Linear  Orientation:  Other:  fully alert and attentive   Thought Content:  denies hallucinations, no delusions   Suicidal Thoughts:  Yes.  without intent/plan- denies plan or intention of hurting self and contracts for safety on unit   Homicidal Thoughts:  No  Memory:  Recent and Remote grossly intact  Judgement:  Fair  Insight:  Fair   Psychomotor Activity:  Normal  Concentration:  Fair  Recall:  Good  Fund of Knowledge:Good  Language: Good  Akathisia:  Negative  Handed:  Right  AIMS (if indicated):     Assets:  Desire for Improvement Resilience  ADL's:  Fair   Cognition: WNL  Sleep:        Current Medications: Current Facility-Administered Medications  Medication Dose Route Frequency Provider Last Rate Last Dose  . acetaminophen (TYLENOL) tablet 650 mg  650 mg Oral Q6H PRN Laverle Hobby, PA-C   650 mg at 01/12/15 0810  . alum & mag hydroxide-simeth (MAALOX/MYLANTA) 200-200-20 MG/5ML suspension 30 mL  30 mL Oral Q4H PRN Laverle Hobby, PA-C      . citalopram (CELEXA) tablet 20 mg  20 mg Oral Daily Laverle Hobby, PA-C   20 mg at 01/12/15 0804  . gabapentin (NEURONTIN) capsule 600 mg  600 mg Oral TID Jenne Campus, MD   600 mg at 01/12/15 1154  . hydrOXYzine (ATARAX/VISTARIL) tablet 50 mg  50 mg Oral Q6H PRN Laverle Hobby, PA-C   50 mg at 01/12/15 0810  . magnesium hydroxide (MILK OF MAGNESIA) suspension 30 mL  30 mL Oral Daily PRN Laverle Hobby, PA-C      . nicotine (NICODERM CQ - dosed in mg/24 hours) patch 21 mg  21 mg Transdermal Daily Laverle Hobby, PA-C   21 mg at 01/12/15 0804  . OLANZapine zydis (ZYPREXA) disintegrating tablet 5 mg  5 mg Oral BID PRN Laverle Hobby, PA-C   5 mg at 01/12/15 0810  . pantoprazole (PROTONIX) EC tablet 20 mg  20 mg Oral Daily Laverle Hobby, PA-C   20 mg at 01/12/15 6269  . [START ON 01/13/2015] QUEtiapine (SEROQUEL) tablet 150 mg  150 mg Oral q morning - 10a Myer Peer Cobos, MD      . QUEtiapine (SEROQUEL) tablet 250 mg  250 mg Oral QHS Fernando A Cobos, MD      . sucralfate (CARAFATE) 1 GM/10ML suspension 1 g  1 g Oral TID WC & HS Laverle Hobby, PA-C   1 g at 01/12/15 1155    Lab Results:  Results for orders placed or performed during the hospital encounter of 01/11/15 (from the past 48 hour(s))  Lipid panel     Status: Abnormal   Collection Time: 01/12/15   6:25 AM  Result Value Ref Range   Cholesterol 168 0 - 200 mg/dL   Triglycerides 227 (H) <150 mg/dL   HDL 45 >40 mg/dL   Total CHOL/HDL Ratio 3.7 RATIO   VLDL 45 (H) 0 - 40 mg/dL   LDL Cholesterol 78 0 - 99 mg/dL    Comment:        Total Cholesterol/HDL:CHD Risk Coronary Heart Disease Risk Table                     Men   Women  1/2 Average Risk   3.4  3.3  Average Risk       5.0   4.4  2 X Average Risk   9.6   7.1  3 X Average Risk  23.4   11.0        Use the calculated Patient Ratio above and the CHD Risk Table to determine the patient's CHD Risk.        ATP III CLASSIFICATION (LDL):  <100     mg/dL   Optimal  100-129  mg/dL   Near or Above                    Optimal  130-159  mg/dL   Borderline  160-189  mg/dL   High  >190     mg/dL   Very High Performed at [facility]     Physical Findings: AIMS: Facial and Oral Movements Muscles of Facial Expression: None, normal Lips and Perioral Area: None, normal Jaw: None, normal Tongue: None, normal,Extremity Movements Upper (arms, wrists, hands, fingers): None, normal Lower (legs, knees, ankles, toes): None, normal, Trunk Movements Neck, shoulders, hips: None, normal, Overall Severity Severity of abnormal movements (highest score from questions above): None, normal Incapacitation due to abnormal movements: None, normal Patient's awareness of abnormal movements (rate only patient's report): No Awareness, Dental Status Current problems with teeth and/or dentures?: No Does patient usually wear dentures?: No  CIWA:  CIWA-Ar Total: 1 COWS:  COWS Total Score: 1   Assessment- patient remains depressed, dysphoric, anxious, with some passive SI, but able to contract for safety. He is future oriented and currently hopeful of going to an inpatient rehab after discharge. He is tolerating medications well and is wanting to increase doses , as he denies current side effects and to address ongoing symptoms.   Treatment Plan  Summary: Daily contact with patient to assess and evaluate symptoms and progress in treatment, Medication management, Plan continue inpatient admission and continue medication management   INCREASE SEROQUEL TO 150 mgrs QAM and 250 mgrs QHS to address ANXIETY, MOOD DISORDER, INSOMNIA D/C TRAZODONE as patient states it is causing nightmares . INCREASE NEURONTIN TO 600 mgrs TID to address PAIN/ANXIETY. CONTINUE CELEXA 20 mgrs QAM to address DEPRESSION, PTSD SYMPTOMS. [place], PROTONIX to address history of GERD .   Continue to encourage sobriety, relapse prevention- Treatment team looking  into possible rehab options.    Medical Decision Making:  Established Problem, Stable/Improving (1), Review of Psycho-Social Stressors (1), Review or order clinical lab tests (1), Review of Medication Regimen & Side Effects (2) and Review of New Medication or Change in Dosage (2)     COBOS, FERNANDO 01/12/2015, 2:36 PM

## 2015-01-12 NOTE — [facility] Group Notes (Deleted)
[facility] LCSW Group Therapy  Emotional Regulation 1:15 - 2: 30 PM        01/12/2015  4:23 PM    Type of Therapy:  Group Therapy  Participation Level:  Appropriate  Participation Quality:  Appropriate  Affect:  Appropriate  Cognitive:  Appropriate  Insight:  Developing/Improving   Engagement in Therapy:  Developing/Improving   Modes of Intervention:  Discussion Exploration Problem-Solving Supportive  Summary of Progress/Problems:  Group topic was emotional regulations.  Patient participated in the discussion and was able to identify fear as the emotion that needed to regulated. She talked about the difficulty in leaving [place] and not being able to spend time with her three year old niece with whom she is very close.  Patient able to stated appropriate coping skills including talking with a counselor about changes.  Steven Barker 01/12/2015 4:23 PM

## 2015-01-12 NOTE — Progress Notes (Signed)
[facility] Group Notes:  (Nursing/MHT/Case Management/Adjunct)  Date:  01/12/2015  Time:  10:29 PM  Type of Therapy:  Psychoeducational Skills  Participation Level:  Active  Participation Quality:  Appropriate  Affect:  Appropriate  Cognitive:  Appropriate  Insight:  Appropriate  Engagement in Group:  Engaged  Modes of Intervention:  Discussion  Summary of Progress/Problems: Tonight in Wrap Up group Dellis said that he slept most of the day. He said that his goal for the day was to get his meds raised by the doctor did that so he was in a pretty good mood once he started moving around. Madaline Savage 01/12/2015, 10:29 PM

## 2015-01-12 NOTE — [facility] Group Notes (Signed)
[facility] LCSW Group Therapy  01/12/2015 4:29 PM  Type of Therapy:  Group Therapy  Participation Level:  Did Not Attend   [name] 01/12/2015, 4:29 PM

## 2015-01-12 NOTE — Plan of Care (Signed)
Problem: Consults Goal: Suicide Risk Patient Education (See Patient Education module for education specifics)  Outcome: Completed/Met Date Met:  01/12/15 Nurse discussed suicidal feelings/coping skills with patient.

## 2015-01-12 NOTE — Progress Notes (Signed)
Recreation Therapy Notes  Date:  01/12/15 Time: 9:30am Location: 300 [address] Dayroom  Group Topic: Stress Management  Goal Area(s) Addresses:  Patient will actively participate in stress management techniques presented during session.   Intervention: Stress management techniques  Activity:  Guided Imagery. LRT read a script that walked patients through the technique Guided Imagery. Technique was coupled with deep breathing.   Education:  Stress Management, Discharge Planning.   Clinical Observations/Feedback: Patient did not attend group.   Mariellen Blaney Lillia Abed LRT/CTRS         Caroll Rancher A 01/12/2015 3:48 PM

## 2015-01-12 NOTE — Progress Notes (Signed)
D:  Patient's self inventory sheet, patient has poor sleep, sleep medication was not helpful.  Good appetite, low energy level, poor concentration.  Rated depression and hopeless 9, anxiety 10.  Denied withdrawals, but then stated he was agitated and irritable.  SI, contracts for safety.  Physical problems of pain, knees, hips, hands.  Stated he was not taking pain medication.  Goal is "meds" today.  Plans to talk to MD.  No discharge plans.   A:  Medications administered per MD orders.  Emotional support and encouragement given patient. R:  SI, contracts for safety.  Denied A/V hallucinations.  Denied HI.  Safety maintained with 15 minute checks.

## 2015-01-12 NOTE — Progress Notes (Signed)
D:Patient in the dayroom on approach.  Patient states he has continued to be agitated and angry today.  Patient states he wants to get more sleep tonight.  Patient appears flat and depressed. Patient states he is passive SI but verbally contracts for safety.  Patient denies HI and denies AVH.   A: Staff to monitor Q 15 mins for safety.  Encouragement and support offered.  Scheduled medications administered per orders.  Zyprexa administered prn for agitation.  Vistaril administered prn for  R: Patient remains safe on the unit.  Patient attended group tonight.  Patient visible on the unit and interacting with peers.  Patient taking administered medications.

## 2015-01-13 MED ORDER — QUETIAPINE FUMARATE 300 MG PO TABS
300.0000 mg | ORAL_TABLET | Freq: Every day | ORAL | Status: DC
  Administered 2015-01-13 – 2015-01-17 (×5): 300 mg via ORAL
  Filled 2015-01-13 (×6): qty 1

## 2015-01-13 MED ORDER — CITALOPRAM HYDROBROMIDE 40 MG PO TABS
40.0000 mg | ORAL_TABLET | Freq: Every day | ORAL | Status: DC
  Administered 2015-01-14 – 2015-01-15 (×2): 40 mg via ORAL
  Filled 2015-01-13 (×2): qty 1
  Filled 2015-01-13: qty 2
  Filled 2015-01-13 (×2): qty 1

## 2015-01-13 NOTE — Progress Notes (Signed)
Adult Psychoeducational Group Note  Date:  01/13/2015 Time:  09:30am  Group Topic/Focus:  Self Care:   The focus of this group is to help patients understand the importance of self-care in order to improve or restore emotional, physical, spiritual, interpersonal, and financial health.  Participation Level:  Did Not Attend  Participation Quality: n/a  Affect: n/a  Cognitive: n/a  Insight: n/a  Engagement in Group: n/a  Modes of Intervention:  Activity, Discussion, Education and Support  Additional Comments:  Pt did not attend group. Pt in bed asleep.   Aurora Mask 01/13/2015, 11:02 AM

## 2015-01-13 NOTE — Progress Notes (Signed)
Patient ID: Steven Barker, male   DOB: 1986/03/05, 29 y.o.   MRN: [id number]  Pt currently presents with a flat affect and depressed behavior. Per self inventory, pt rates depression at a 9, hopelessness 9 and anxiety 10. Pt's daily goal is to "stop the bad thoughts" and they intend to do so by "I don't know." Pt also writes "I have been really depressed." Pt reports fair sleep, poor concentration and a fair appetite.   Pt provided with medications per providers orders. Pt's labs and vitals were monitored throughout the day. Pt supported emotionally and encouraged to express concerns and questions. Pt educated on medications and substance abuse. Pt given a 1:1 about substances and its affect on physical health.   Pt's safety ensured with 15 minute and environmental checks. Pt endorses SI and denies that he would act upon these thoughts while at [facility]. Pt currently denies HI and A/V hallucinations. Pt verbally agrees to seek staff if SI/HI or A/VH occurs and to consult with staff before acting on suicidal thoughts.

## 2015-01-13 NOTE — Progress Notes (Signed)
D: Patient in the dayroom on approach.  Patient intrusive and loud.  Patient blurted out in front of all the patients in the dayroom I need my Zyprexa."  Writer spoke with patient 1 on 1 and asked him to speak to staff privately about medications.  Patient states he is passive SI.  Patient denies HH/AVH. A: Staff to monitor Q 15 mins for safety.  Encouragement and support offered.  Scheduled medications administered per orders.  Zyprexa administered prn for agitation. R: Patient remains safe on the unit.  Patient attended group tonight.  Patient visible on the unit and interacting with peers.  Patient taking administered medications.

## 2015-01-13 NOTE — Progress Notes (Signed)
Patient attended karaoke group.  

## 2015-01-13 NOTE — Progress Notes (Addendum)
Patient ID: Steven Barker, male   DOB: 19-Sep-1985, 29 y.o.   MRN: [id number] [facility] MD Progress Note  01/13/2015 12:17 PM Steven Barker  MRN:  [id number] Subjective:  Patient reports ongoing depression, sadness, and reports passive SI, " like thinking about not being alive ",  But denies any actual plan or intention to hurt self and continues to contract for safety on the unit. He states he has had no visitors partly because he has asked family, loved ones not to come see him yet, because " I don't want them to see me like this and get bummed out". States he has been trying to " sleep more" in order to avoid negative feelings, depression. Denies medication side effects. Although depressed, he does continue to be future oriented and remains interested in going to an inpatient rehab after discharge. Objective : I have discussed case with staff and have met with patient. There is some partial improvement , noticed in increased verbal output, slightly more reactive ( although still constricted ) affect, and improved grooming. He does , however, continue to present depressed, sad, constricted in affect overall. Denies medication side effects- states Celexa seems to be helping " a little". He states that of all the medications he has been on , Seroquel has been most effective " but I know it can take a little time to kick in". He remains optimistic that medications will work for him. No disruptive or agitated behaviors, some group participation although at times tends to isolate in room. Staff has noted his mood to be depressed, dysphoric . Some group participation.  Tolerating medications well, denies side effects- as noted, he states Seroquel has been effective and well tolerated  EKG- WNL, no QTc  Prolongation.  .Principal Problem: Substance induced mood disorder Diagnosis:   Patient Active Problem List   Diagnosis Date Noted  . Major depressive disorder, recurrent, severe without psychotic features  [F33.2]   . Substance induced mood disorder [F19.94] 11/05/2014  . Suicidal ideation [R45.851] 11/05/2014  . Anxiety state, unspecified [F41.1] 01/01/2014  . 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 Rheumatoid arthritis [M06.9] 05/17/2013  . Polysubstance abuse [F19.10] 05/17/2013   Total Time spent with patient: 20 minutes   Past Medical History:  Past Medical History  Diagnosis Date  . Anxiety   . Depression   . Rheumatoid arthritis(714.0)    History reviewed. No pertinent past surgical history. Family History: History reviewed. No pertinent family history. Social History:  History  Alcohol Use No    Comment: last used over a month ago     History  Drug Use  . Yes  . Special: Marijuana, Cocaine    History   Social History  . Marital Status: Single    Spouse Name: N/A  . Number of Children: N/A  . Years of Education: N/A   Social History Main Topics  . Smoking status: Current Every Day Smoker -- 0.50 packs/day for 10 years    Types: Cigarettes  . Smokeless tobacco: Not on file  . Alcohol Use: No     Comment: last used over a month ago  . Drug Use: Yes    Special: Marijuana, Cocaine  . Sexual Activity: Yes    Birth Control/ Protection: None   Other Topics Concern  . None   Social History Narrative   Additional History:    Sleep:  States he slept better and did not have nightmares or vivid dreams now that he is  off Trazodone.  Appetite: fair    Assessment:   Musculoskeletal: Strength & Muscle Tone: within normal limits Gait & Station: normal Patient leans: N/A   Psychiatric Specialty Exam: Physical Exam  ROS- no akathisia,  No vomiting , nausea, no epigastralgia or melenas reported/endorsed   Blood pressure 126/79, pulse 67, temperature 97.4 F (36.3 C), temperature source Oral, resp. rate 16, height 5' 6"  (1.676 m), weight 148 lb (67.132 kg), SpO2 100 %.Body mass index is 23.9 kg/(m^2).  General  Appearance: grooming is improving   Eye Contact::  Good  Speech:  Normal Rate  Volume:  Normal  Mood:  Depressed  Affect:  Congruent- constricted, but smiles briefly at times   Thought Process:  Goal Directed and Linear  Orientation:  Other:  fully alert and attentive   Thought Content:  denies hallucinations, no delusions - not internally preoccupied   Suicidal Thoughts:  Yes.  without intent/plan- describes some ongoing passive thoughts of death, but  denies plan or intention of hurting self and contracts for safety on unit   Homicidal Thoughts:  No  Memory:  Recent and Remote grossly intact  Judgement:  Fair  Insight:  Fair  Psychomotor Activity:  Normal  Concentration:  Fair  Recall:  Good  Fund of Knowledge:Good  Language: Good  Akathisia:  Negative  Handed:  Right  AIMS (if indicated):     Assets:  Desire for Improvement Resilience  ADL's:  Fair   Cognition: WNL  Sleep:        Current Medications: Current Facility-Administered Medications  Medication Dose Route Frequency Provider Last Rate Last Dose  . acetaminophen (TYLENOL) tablet 650 mg  650 mg Oral Q6H PRN Laverle Hobby, PA-C   650 mg at 01/13/15 5726  . alum & mag hydroxide-simeth (MAALOX/MYLANTA) 200-200-20 MG/5ML suspension 30 mL  30 mL Oral Q4H PRN Laverle Hobby, PA-C      . citalopram (CELEXA) tablet 20 mg  20 mg Oral Daily Laverle Hobby, PA-C   20 mg at 01/13/15 0803  . gabapentin (NEURONTIN) capsule 600 mg  600 mg Oral TID Jenne Campus, MD   600 mg at 01/13/15 1207  . hydrOXYzine (ATARAX/VISTARIL) tablet 50 mg  50 mg Oral Q6H PRN Laverle Hobby, PA-C   50 mg at 01/12/15 2125  . magnesium hydroxide (MILK OF MAGNESIA) suspension 30 mL  30 mL Oral Daily PRN Laverle Hobby, PA-C      . nicotine (NICODERM CQ - dosed in mg/24 hours) patch 21 mg  21 mg Transdermal Daily Laverle Hobby, PA-C   21 mg at 01/13/15 0803  . OLANZapine zydis (ZYPREXA) disintegrating tablet 5 mg  5 mg Oral BID PRN Laverle Hobby, PA-C   5 mg at 01/13/15 2035  . pantoprazole (PROTONIX) EC tablet 20 mg  20 mg Oral Daily Laverle Hobby, PA-C   20 mg at 01/13/15 5974  . QUEtiapine (SEROQUEL) tablet 150 mg  150 mg Oral q morning - 10a Jenne Campus, MD   150 mg at 01/13/15 0958  . QUEtiapine (SEROQUEL) tablet 250 mg  250 mg Oral QHS Jenne Campus, MD   250 mg at 01/12/15 2125  . sucralfate (CARAFATE) 1 GM/10ML suspension 1 g  1 g Oral TID WC & HS Laverle Hobby, PA-C   1 g at 01/13/15 1207    Lab Results:  Results for orders placed or performed during the hospital encounter of 01/11/15 (from the past 48  hour(s))  Lipid panel     Status: Abnormal   Collection Time: 01/12/15  6:25 AM  Result Value Ref Range   Cholesterol 168 0 - 200 mg/dL   Triglycerides 227 (H) <150 mg/dL   HDL 45 >40 mg/dL   Total CHOL/HDL Ratio 3.7 RATIO   VLDL 45 (H) 0 - 40 mg/dL   LDL Cholesterol 78 0 - 99 mg/dL    Comment:        Total Cholesterol/HDL:CHD Risk Coronary Heart Disease Risk Table                     Men   Women  1/2 Average Risk   3.4   3.3  Average Risk       5.0   4.4  2 X Average Risk   9.6   7.1  3 X Average Risk  23.4   11.0        Use the calculated Patient Ratio above and the CHD Risk Table to determine the patient's CHD Risk.        ATP III CLASSIFICATION (LDL):  <100     mg/dL   Optimal  100-129  mg/dL   Near or Above                    Optimal  130-159  mg/dL   Borderline  160-189  mg/dL   High  >190     mg/dL   Very High Performed at [facility]     Physical Findings: AIMS: Facial and Oral Movements Muscles of Facial Expression: None, normal Lips and Perioral Area: None, normal Jaw: None, normal Tongue: None, normal,Extremity Movements Upper (arms, wrists, hands, fingers): None, normal Lower (legs, knees, ankles, toes): None, normal, Trunk Movements Neck, shoulders, hips: None, normal, Overall Severity Severity of abnormal movements (highest score from questions above): None,  normal Incapacitation due to abnormal movements: None, normal Patient's awareness of abnormal movements (rate only patient's report): No Awareness, Dental Status Current problems with teeth and/or dentures?: No Does patient usually wear dentures?: No  CIWA:  CIWA-Ar Total: 1 COWS:  COWS Total Score: 1   Assessment- patient remains depressed and dysphoric , sad, with passive SI, but contracting for safety on the unit. He has  A history of responding well to Seroquel and is tolerating Celexa well.  Still wanting to go to an inpatient rehab after discharge- states he is trying to get legal case continued so that legal issues will not impede his ability to go to Rehab, as he is aware that [facility] may not accept him with outstanding, upcoming legal issues .    Treatment Plan Summary: Daily contact with patient to assess and evaluate symptoms and progress in treatment, Medication management, Plan continue inpatient admission and continue medication management   INCREASE SEROQUEL TO 150 mgrs QAM and  300  mgrs QHS to address ANXIETY, MOOD DISORDER, INSOMNIA CONTINUE  NEURONTIN TO 600 mgrs TID to address PAIN/ANXIETY. INCREASE  CELEXA to 40 mgrs QAM to address  PERSISTENT DEPRESSION, PTSD SYMPTOMS. [place], PROTONIX to address history of GERD .  Encourage group participation, milieu, and working towards minimizing isolation.   Continue to encourage sobriety, relapse prevention- Treatment team looking  into possible rehab options.    Medical Decision Making:  Established Problem, Stable/Improving (1), Review of Psycho-Social Stressors (1), Review or order clinical lab tests (1), Review of Medication Regimen & Side Effects (2) and Review of New Medication or Change  in Dosage (2)     Steven Barker 01/13/2015, 12:17 PM

## 2015-01-13 NOTE — BHH Group Notes (Signed)
[facility] LCSW Group Therapy  [facility] 1:15 - 2:30 PM 01/13/2015 2:59 PM  Type of Therapy:  Group Therapy  Participation Level:  Did Not Attend   [name] 01/13/2015, 2:59 PM

## 2015-01-14 NOTE — BHH Group Notes (Signed)
[facility] LCSW Aftercare Discharge Planning Group Note   01/14/2015 10:04 AM    Participation Quality:  Appropraite  Mood/Affect:  Appropriate  Depression Rating:  8  Anxiety Rating:  8  Thoughts of Suicide:  No  Will you contract for safety? Yes  Current AVH:  Yes  Plan for Discharge/Comments:  Patient attended discharge planning group and actively participated in group. He will discharge home and follow up with [facility]. Suicide prevention education reviewed and SPE document provided.   Transportation Means: Patient has transportation.   Supports:  Patient has a support system.   Keara Pagliarulo, Joesph July

## 2015-01-14 NOTE — Tx Team (Signed)
Interdisciplinary Treatment Plan Update (Adult)  Date:  01/14/2015  Time Reviewed:  11:30 AM   Progress in Treatment: Attending groups: Patient is attending groups. Participating in groups:  Patient engages in discussion Taking medication as prescribed:  Patient is taking medications Tolerating medication:  Patient is tolerating medications Family/Significant othe contact made:   No, patient declined collateral contact Patient understands diagnosis:Yes, patient understands diagnosis and need for treatment Discussing patient identified problems/goals with staff:  Yes, patient is able to express goals/problems Medical problems stabilized or resolved:  Yes Denies suicidal/homicidal ideation: No. Patient endorses SI but contracts for safety Issues/concerns per patient self-inventory:   Other:   Discharge Plan or Barriers:  Discharge home with family and follow up with [facility]  Reason for Continuation of Hospitalization: Anxiety Depression Medication stabilization Suicidal ideation  Comments:    Continue medication stabilization  Additional comments:  Patient and CSW reviewed Patient Discharge Process Letter/Patient Involvement Form.  Patient verbalized understanding and signed form.  Patient and CSW also reviewed and identified patient's goals and treatment plan.  Patient verbalized understanding and agreed to plan.  Estimated length of stay: 3-5 days   Review of initial/current patient goals per problem list:  Please see plan of care.   Attendees: Patient 01/14/2015 11:30 AM   Family:   01/14/2015 11:30 AM   Physician:  Nehemiah Massed, MD 01/14/2015 11:30 AM   Nursing:   Quintella Reichert, RN 01/14/2015 11:30 AM   Clinical Social Worker:  Juline Patch, LCSW 01/14/2015 11:30 AM   Clinical Social Worker:  Belenda Cruise Drinkard, LCSW-A 01/14/2015 11:30 AM   Case Manager:  Onnie Boer, RN 01/14/2015 11:30 AM   Other:  Liborio Nixon, RN 01/14/2015 11:30 AM  Other:   01/14/2015  11:30 AM    Other:  01/14/2015 11:30 AM   Other:  01/14/2015 11:30 AM   Other:  01/14/2015 11:30 AM   Other:  Chad Cordial Transition Team Coordinator 01/14/2015 11:30 AM   Other:   01/14/2015 11:30 AM   Other:  01/14/2015 11:30 AM   Other:   01/14/2015 11:30 AM    Scribe for Treatment Team:   [name], 01/14/2015   11:30 AM

## 2015-01-14 NOTE — BHH Group Notes (Signed)
[facility] LCSW Group Therapy  01/14/2015 3:43 PM  Type of Therapy:  Group Therapy  Participation Level:  Did Not Attend  [name] 01/14/2015, 3:43 PM

## 2015-01-14 NOTE — Progress Notes (Signed)
Adult Psychoeducational Group Note  Date:  01/14/2015 Time:  10:54 PM  Group Topic/Focus:  Goals Group:   The focus of this group is to help patients establish daily goals to achieve during treatment and discuss how the patient can incorporate goal setting into their daily lives to aide in recovery.  Participation Level:  Active  Participation Quality:  Appropriate  Affect:  Appropriate  Cognitive:  Appropriate  Insight: Appropriate  Engagement in Group:  Engaged  Modes of Intervention:  Discussion  Additional Comments:  Pt stated that being able to play ball and do something active made his day go by smoother.  Terie Purser R 01/14/2015, 10:54 PM

## 2015-01-14 NOTE — Progress Notes (Signed)
D: patient is A&Ox4, negative for A/V hallucinations. Negative for HI but does have passive SI, without a plan currently. States he can contract for safety "as long as we keep giving him his medications". Patient is mostly cooperative, blunted affect, interacting appropriately and going to groups. A: Patient is getting his medications as scheduled and prn. Patient given emotional support as needed. Continue monitoring patient q15 minutes and prn for safety. R: patient remains safe. Patient states he will let staff know if he can no longer contract for safety.  Darcella Shiffman, Wyman Songster, RN

## 2015-01-14 NOTE — Progress Notes (Signed)
Recreation Therapy Notes  Date: 01/14/15 Time: 9:30am Location: [address] Dayroom  Group Topic: Stress Management  Goal Area(s) Addresses:  Patient will verbalize importance of using healthy stress management.  Patient will identify positive emotions associated with healthy stress management.   Intervention: Guided Imagery Script  Activity :  Patients will listen to LRT read the Guided Imagery Script and follow the prompts to become more relaxed.  Education:  Stress Management, Discharge Planning.   Education Outcome: Acknowledges edcuation/In group clarification offered  Clinical Observations/Feedback: Patient did not participate.   Caroll Rancher, LRT/CTRS         Caroll Rancher A 01/14/2015 4:53 PM

## 2015-01-14 NOTE — Progress Notes (Addendum)
Patient ID: Steven Barker, male   DOB: [birth year], 29 y.o.   MRN: [id number] [facility] MD Progress Note  01/14/2015 3:00 PM Steven Barker  MRN:  [id number] Subjective:   Patient continues to report depression, sadness. He does state he is starting to feel somewhat better, and states that he is going to ask his loved ones to visit him later today or tomorrow, whereas he had asked them not to come visit him thus far because he did not want them to see him in his depressed condition. Denies medication side effects.  Objective : I have discussed case with staff and have met with patient. Patient continues to report significant depression, sense of sadness, anhedonia, and passive SI at this time. He does contract for safety on the unit. Some group participation. No overtly disruptive behaviors on unit, bu yesterday requested Zyprexa PRN for subjective agitation. Although depressed, remains future oriented and stated that he has spoken with his lawyer to insure that an upcoming court date is extended/continued, so that it does not interfere with his ability to go to an inpatient rehabilitation setting which is his goal. States he feels he is at high risk of relapse at this time if he leaves- states " I went to buy cocaine ( his substance of choice from which he had been sober for several months ) but the guy did not have it , so I got Xanax instead". He states he is motivated in sobriety and wants to go to a Rehab. Although endorsing depression and still appearing constricted in affect, there is improvement- he is better groomed, he is more conversant, and he seems more future oriented . Denies medication side effects at present .  As noted, states Seroquel has been effective for him. Denies side effects. We have reviewed side effects to include risk of movement disorders, weight gain and  Metabolic disorder .   Marland KitchenPrincipal Problem: Substance induced mood disorder Diagnosis:   Patient Active Problem List   Diagnosis Date Noted  . Major depressive disorder, recurrent, severe without psychotic features [F33.2]   . Substance induced mood disorder [F19.94] 11/05/2014  . Suicidal ideation [R45.851] 11/05/2014  . Anxiety state, unspecified [F41.1] 01/01/2014  . 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 Rheumatoid arthritis [M06.9] 05/17/2013  . Polysubstance abuse [F19.10] 05/17/2013   Total Time spent with patient: 20 minutes   Past Medical History:  Past Medical History  Diagnosis Date  . Anxiety   . Depression   . Rheumatoid arthritis(714.0)    History reviewed. No pertinent past surgical history. Family History: History reviewed. No pertinent family history. Social History:  History  Alcohol Use No    Comment: last used over a month ago     History  Drug Use  . Yes  . Special: Marijuana, Cocaine    History   Social History  . Marital Status: Single    Spouse Name: N/A  . Number of Children: N/A  . Years of Education: N/A   Social History Main Topics  . Smoking status: Current Every Day Smoker -- 0.50 packs/day for 10 years    Types: Cigarettes  . Smokeless tobacco: Not on file  . Alcohol Use: No     Comment: last used over a month ago  . Drug Use: Yes    Special: Marijuana, Cocaine  . Sexual Activity: Yes    Birth Control/ Protection: None   Other Topics Concern  . None   Social History Narrative  Additional History:    Sleep:  States he slept better and did not have nightmares or vivid dreams now that he is off Trazodone.  Appetite: fair    Assessment:   Musculoskeletal: Strength & Muscle Tone: within normal limits Gait & Station: normal Patient leans: N/A   Psychiatric Specialty Exam: Physical Exam  ROS- no akathisia,  No vomiting , nausea, no epigastralgia or melenas reported/endorsed   Blood pressure 125/76, pulse 72, temperature 97.8 F (36.6 C), temperature source Oral, resp. rate 19, height _0   (1.676 m), weight 148 lb (67.132 kg), SpO2 100 %.Body mass index is 23.9 kg/(m^2).  General Appearance: Well Groomed  Engineer, water::  Good  Speech:  Normal Rate  Volume:  Normal  Mood:  Depressed  Affect:   Still constricted but at times reactive affect    Thought Process:  Goal Directed and Linear  Orientation:  Other:  fully alert and attentive   Thought Content:  denies hallucinations, no delusions - not internally preoccupied   Suicidal Thoughts:  Yes.  without intent/plan-  Continues to describe passive thoughts of death and states would not feel safe if discharged at this time, but denies plan or intention of hurting self on unit at this time, and contracts for safety on the unit .  Homicidal Thoughts:  No  Memory:  Recent and Remote grossly intact  Judgement:  Fair  Insight:  Fair  Psychomotor Activity:  Normal  Concentration:  Fair  Recall:  Good  Fund of Knowledge:Good  Language: Good  Akathisia:  Negative  Handed:  Right  AIMS (if indicated):     Assets:  Desire for Improvement Resilience  ADL's:  Fair   Cognition: WNL  Sleep:  Number of Hours: 6.75     Current Medications: Current Facility-Administered Medications  Medication Dose Route Frequency Provider Last Rate Last Dose  . acetaminophen (TYLENOL) tablet 650 mg  650 mg Oral Q6H PRN Laverle Hobby, PA-C   650 mg at 01/14/15 3646  . alum & mag hydroxide-simeth (MAALOX/MYLANTA) 200-200-20 MG/5ML suspension 30 mL  30 mL Oral Q4H PRN Laverle Hobby, PA-C      . citalopram (CELEXA) tablet 40 mg  40 mg Oral Daily Jenne Campus, MD   40 mg at 01/14/15 0806  . gabapentin (NEURONTIN) capsule 600 mg  600 mg Oral TID Jenne Campus, MD   600 mg at 01/14/15 1201  . hydrOXYzine (ATARAX/VISTARIL) tablet 50 mg  50 mg Oral Q6H PRN Laverle Hobby, PA-C   50 mg at 01/14/15 1201  . magnesium hydroxide (MILK OF MAGNESIA) suspension 30 mL  30 mL Oral Daily PRN Laverle Hobby, PA-C      . nicotine (NICODERM CQ - dosed in mg/24  hours) patch 21 mg  21 mg Transdermal Daily Laverle Hobby, PA-C   21 mg at 01/14/15 0808  . OLANZapine zydis (ZYPREXA) disintegrating tablet 5 mg  5 mg Oral BID PRN Laverle Hobby, PA-C   5 mg at 01/14/15 8032  . pantoprazole (PROTONIX) EC tablet 20 mg  20 mg Oral Daily Laverle Hobby, PA-C   20 mg at 01/14/15 1224  . QUEtiapine (SEROQUEL) tablet 150 mg  150 mg Oral q morning - 10a Jenne Campus, MD   150 mg at 01/14/15 0946  . QUEtiapine (SEROQUEL) tablet 300 mg  300 mg Oral QHS Jenne Campus, MD   300 mg at 01/13/15 2136  . sucralfate (CARAFATE) 1 GM/10ML suspension 1  g  1 g Oral TID WC & HS Laverle Hobby, PA-C   1 g at 01/14/15 1201    Lab Results:  No results found for this or any previous visit (from the past 48 hour(s)).  Physical Findings: AIMS: Facial and Oral Movements Muscles of Facial Expression: None, normal Lips and Perioral Area: None, normal Jaw: None, normal Tongue: None, normal,Extremity Movements Upper (arms, wrists, hands, fingers): None, normal Lower (legs, knees, ankles, toes): None, normal, Trunk Movements Neck, shoulders, hips: None, normal, Overall Severity Severity of abnormal movements (highest score from questions above): None, normal Incapacitation due to abnormal movements: None, normal Patient's awareness of abnormal movements (rate only patient's report): No Awareness, Dental Status Current problems with teeth and/or dentures?: No Does patient usually wear dentures?: No  CIWA:  CIWA-Ar Total: 1 COWS:  COWS Total Score: 1   Assessment- patient remains  Sad, depressed, but with improving relatedness, interactions with others, speech, and grooming. Although describes passive thoughts of death, he is future oriented, and remains motivated and intent on going to an inpatient rehabilitation setting after discharge, even working on getting court dates continued so he can go to Rehab without pending court issues . He presents genuinely motivated in  recovery, relapse prevention .  Tolerating medications well and no side effects as medications have been increased in dose-  reports current medication management helping and well tolerated.    Treatment Plan Summary: Daily contact with patient to assess and evaluate symptoms and progress in treatment, Medication management, Plan continue inpatient admission and continue medication management   CONTINUE  SEROQUEL 150 mgrs QAM and  300  mgrs QHS to address ANXIETY, MOOD DISORDER, INSOMNIA CONTINUE  NEURONTIN  600 mgrs TID to address PAIN/ANXIETY. CONTINUE  CELEXA 40 mgrs QAM to address  PERSISTENT DEPRESSION, PTSD SYMPTOMS. [place], PROTONIX to address history of GERD .  Encourage group participation, milieu, and working towards minimizing isolation.   Continue to encourage sobriety, relapse prevention- Treatment team looking  into possible rehab options.  As discussed with SW, paperwork/referral is being sent to [place], but states that legal issues may still be a reason for him not to be accepted - patient is aware that this is a possibility.   Medical Decision Making:  Established Problem, Stable/Improving (1), Review of Psycho-Social Stressors (1), Review or order clinical lab tests (1) and Review of Medication Regimen & Side Effects (2)     Kee Drudge 01/14/2015, 3:00 PM

## 2015-01-15 DIAGNOSIS — F1994 Other psychoactive substance use, unspecified with psychoactive substance-induced mood disorder: Secondary | ICD-10-CM

## 2015-01-15 DIAGNOSIS — F332 Major depressive disorder, recurrent severe without psychotic features: Principal | ICD-10-CM

## 2015-01-15 DIAGNOSIS — R45851 Suicidal ideations: Secondary | ICD-10-CM

## 2015-01-15 MED ORDER — GABAPENTIN 400 MG PO CAPS
800.0000 mg | ORAL_CAPSULE | Freq: Every day | ORAL | Status: DC
  Administered 2015-01-15: 800 mg via ORAL
  Filled 2015-01-15 (×4): qty 2

## 2015-01-15 MED ORDER — GABAPENTIN 300 MG PO CAPS
600.0000 mg | ORAL_CAPSULE | Freq: Two times a day (BID) | ORAL | Status: DC
  Administered 2015-01-16 (×2): 600 mg via ORAL
  Filled 2015-01-15 (×5): qty 2

## 2015-01-15 MED ORDER — CITALOPRAM HYDROBROMIDE 40 MG PO TABS
40.0000 mg | ORAL_TABLET | Freq: Every day | ORAL | Status: DC
  Administered 2015-01-16 – 2015-01-18 (×3): 40 mg via ORAL
  Filled 2015-01-15 (×4): qty 1

## 2015-01-15 MED ORDER — OLANZAPINE 5 MG PO TBDP
5.0000 mg | ORAL_TABLET | Freq: Three times a day (TID) | ORAL | Status: DC | PRN
  Administered 2015-01-15 – 2015-01-17 (×6): 5 mg via ORAL
  Filled 2015-01-15 (×6): qty 1

## 2015-01-15 NOTE — Progress Notes (Signed)
Patient ID: Steven Barker, male   DOB: June 25, 1986, 29 y.o.   MRN: [id number]   Pt currently presents with a flat affect and depressed behavior. Per self inventory, pt rates depression, hopelessness and anxiety at a 10. Pt's daily goal is to "stay in groups until the end" and they intend to do so by "try squeezing the stress ball." Pt reports poor sleep, poor concentration and a good appetite.   Pt provided with medications per providers orders. Pt's labs and vitals were monitored throughout the day. Pt supported emotionally and encouraged to express concerns and questions. Pt educated on medications and substance abuse.  Pt's safety ensured with 15 minute and environmental checks. Pt currently denies SI/HI and A/V hallucinations. Pt verbally agrees to seek staff if SI/HI or A/VH occurs and to consult with staff before acting on these thoughts. Pt has a sarcastic tone when speaking to staff members. Pt reports "I want to go to rehab." Pt tried to Corporate investment banker into giving him nicorette gum on top of a patch that he took off and left in his room.

## 2015-01-15 NOTE — Progress Notes (Signed)
Writer has observed patient up in the dayroom interacting appropriately with peers. Writer spoke with him 1:1 and he requested medications for anxiety, agitation and pain. He reports passive si and verblly contracts for safety, denies hi/a/v hallucinations.  He attended group this evening and reports that his goal is to go to rehab once discharged because he has been lying about needing help. Support and encouragement given, safety maintained on unit with 15 min checks.

## 2015-01-15 NOTE — Progress Notes (Signed)
Patient ID: Steven Barker, male   DOB: Nov 19, 1985, 29 y.o.   MRN: [id number]  Adult Psychoeducational Group Note  Date:  01/15/2015 Time: 09:30am  Group Topic/Focus:  Crisis Planning:   The purpose of this group is to help patients create a crisis plan for use upon discharge or in the future, as needed.  Participation Level:  Active  Participation Quality:  Minimal  Affect:  Anxious  Cognitive:  Alert and Oriented  Insight: Improving  Engagement in Group:   Improving  Modes of Intervention:  Activity, Confrontation, Discussion, Education and Support  Additional Comments:  Pt able to identify trigger of his/her mental health crisis prior to coming to [facility].   Aurora Mask 01/15/2015, 5:31 PM

## 2015-01-15 NOTE — Plan of Care (Signed)
Problem: Diagnosis: Increased Risk For Suicide Attempt Goal: STG-Patient Will Comply With Medication Regime Outcome: Progressing Patient compliant with scheduled medications.     

## 2015-01-15 NOTE — Progress Notes (Signed)
[facility] MD Progress Note  01/15/2015 5:16 PM Steven Barker  MRN:  [id number] Subjective:   Patient continues to report depression, sadness. He does state he is starting to feel somewhat better. Denies medication side effects.  Objective : I have discussed case with staff and have met with patient. Patient continues to report significant depression, sense of sadness, anhedonia, and passive SI at this time. He does contract for safety on the unit. Some group participation. No odisruptive behaviors on unit.  States he asks for Zyprexa PRN for subjective agitation. Denies medication side effects at present .  As noted, states Seroquel has been effective for him.    .Principal Problem: Substance induced mood disorder Diagnosis:   Patient Active Problem List   Diagnosis Date Noted  . Major depressive disorder, recurrent, severe without psychotic features [F33.2]   . Substance induced mood disorder [F19.94] 11/05/2014  . Suicidal ideation [R45.851] 11/05/2014  . Anxiety state, unspecified [F41.1] 01/01/2014  . 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 Rheumatoid arthritis [M06.9] 05/17/2013  . Polysubstance abuse [F19.10] 05/17/2013   Total Time spent with patient: 20 minutes   Past Medical History:  Past Medical History  Diagnosis Date  . Anxiety   . Depression   . Rheumatoid arthritis(714.0)    History reviewed. No pertinent past surgical history. Family History: History reviewed. No pertinent family history. Social History:  History  Alcohol Use No    Comment: last used over a month ago     History  Drug Use  . Yes  . Special: Marijuana, Cocaine    History   Social History  . Marital Status: Single    Spouse Name: N/A  . Number of Children: N/A  . Years of Education: N/A   Social History Main Topics  . Smoking status: Current Every Day Smoker -- 0.50 packs/day for 10 years    Types: Cigarettes  . Smokeless tobacco: Not on file  .  Alcohol Use: No     Comment: last used over a month ago  . Drug Use: Yes    Special: Marijuana, Cocaine  . Sexual Activity: Yes    Birth Control/ Protection: None   Other Topics Concern  . None   Social History Narrative   Additional History:    Sleep:  States he slept better and did not have nightmares or vivid dreams now that he is off Trazodone.  Appetite: fair    Assessment:   Musculoskeletal: Strength & Muscle Tone: within normal limits Gait & Station: normal Patient leans: N/A   Psychiatric Specialty Exam: Physical Exam  ROS- no akathisia,  No vomiting , nausea, no epigastralgia or melenas reported/endorsed   Blood pressure 124/82, pulse 68, temperature 97.8 F (36.6 C), temperature source Oral, resp. rate 18, height 5' 6"  (1.676 m), weight 67.132 kg (148 lb), SpO2 100 %.Body mass index is 23.9 kg/(m^2).  General Appearance: Well Groomed  Engineer, water::  Good  Speech:  Normal Rate  Volume:  Normal  Mood:  Depressed  Affect:   Still constricted but at times reactive affect    Thought Process:  Goal Directed and Linear  Orientation:  Other:  fully alert and attentive   Thought Content:  denies hallucinations, no delusions - not internally preoccupied   Suicidal Thoughts:  Yes.  without intent/plan-  Continues to describe passive thoughts of death and states would not feel safe if discharged at this time, but denies plan or intention of hurting self on unit  at this time, and contracts for safety on the unit .  Homicidal Thoughts:  No  Memory:  Recent and Remote grossly intact  Judgement:  Fair  Insight:  Fair  Psychomotor Activity:  Normal  Concentration:  Fair  Recall:  Good  Fund of Knowledge:Good  Language: Good  Akathisia:  Negative  Handed:  Right  AIMS (if indicated):     Assets:  Desire for Improvement Resilience  ADL's:  Fair   Cognition: WNL  Sleep:  Number of Hours: 6.75     Current Medications: Current Facility-Administered Medications   Medication Dose Route Frequency Provider Last Rate Last Dose  . acetaminophen (TYLENOL) tablet 650 mg  650 mg Oral Q6H PRN Laverle Hobby, PA-C   650 mg at 01/15/15 1632  . alum & mag hydroxide-simeth (MAALOX/MYLANTA) 200-200-20 MG/5ML suspension 30 mL  30 mL Oral Q4H PRN Laverle Hobby, PA-C      . [START ON 01/16/2015] citalopram (CELEXA) tablet 40 mg  40 mg Oral Daily Kerrie Buffalo, NP      . Derrill Memo ON 01/16/2015] gabapentin (NEURONTIN) capsule 600 mg  600 mg Oral BID Kerrie Buffalo, NP      . gabapentin (NEURONTIN) capsule 800 mg  800 mg Oral QHS Kerrie Buffalo, NP      . hydrOXYzine (ATARAX/VISTARIL) tablet 50 mg  50 mg Oral Q6H PRN Laverle Hobby, PA-C   50 mg at 01/14/15 1945  . magnesium hydroxide (MILK OF MAGNESIA) suspension 30 mL  30 mL Oral Daily PRN Laverle Hobby, PA-C      . nicotine (NICODERM CQ - dosed in mg/24 hours) patch 21 mg  21 mg Transdermal Daily Laverle Hobby, PA-C   21 mg at 01/15/15 0817  . OLANZapine zydis (ZYPREXA) disintegrating tablet 5 mg  5 mg Oral TID PRN Kerrie Buffalo, NP   5 mg at 01/15/15 1626  . pantoprazole (PROTONIX) EC tablet 20 mg  20 mg Oral Daily Laverle Hobby, PA-C   20 mg at 01/15/15 1610  . QUEtiapine (SEROQUEL) tablet 150 mg  150 mg Oral q morning - 10a Jenne Campus, MD   150 mg at 01/15/15 1054  . QUEtiapine (SEROQUEL) tablet 300 mg  300 mg Oral QHS Jenne Campus, MD   300 mg at 01/14/15 2114  . sucralfate (CARAFATE) 1 GM/10ML suspension 1 g  1 g Oral TID WC & HS Laverle Hobby, PA-C   1 g at 01/15/15 1649    Lab Results:  No results found for this or any previous visit (from the past 48 hour(s)).  Physical Findings: AIMS: Facial and Oral Movements Muscles of Facial Expression: None, normal Lips and Perioral Area: None, normal Jaw: None, normal Tongue: None, normal,Extremity Movements Upper (arms, wrists, hands, fingers): None, normal Lower (legs, knees, ankles, toes): None, normal, Trunk Movements Neck, shoulders, hips:  None, normal, Overall Severity Severity of abnormal movements (highest score from questions above): None, normal Incapacitation due to abnormal movements: None, normal Patient's awareness of abnormal movements (rate only patient's report): No Awareness, Dental Status Current problems with teeth and/or dentures?: No Does patient usually wear dentures?: No  CIWA:  CIWA-Ar Total: 1 COWS:  COWS Total Score: 2   Assessment- patient remains  Sad, depressed, but with improving relatedness, interactions with others, speech, and grooming. Although describes passive thoughts of death, he is future oriented, and remains motivated and intent on going to an inpatient rehabilitation setting after discharge, even working on getting court dates  continued so he can go to Rehab without pending court issues . He presents genuinely motivated in recovery, relapse prevention .  Tolerating medications well and no side effects as medications have been increased in dose-  reports current medication management helping and well tolerated.    Treatment Plan Summary: Daily contact with patient to assess and evaluate symptoms and progress in treatment, Medication management, Plan continue inpatient admission and continue medication management   CONTINUE  SEROQUEL 150 mgrs QAM and  300  mgrs QHS to address ANXIETY, MOOD DISORDER, INSOMNIA CONTINUE  NEURONTIN  600 mgrs TID to address PAIN/ANXIETY. CONTINUE  CELEXA 40 mgrs QAM to address  PERSISTENT DEPRESSION, PTSD SYMPTOMS. [place], PROTONIX to address history of GERD .  Increased Zyprexa PRN to 10 mg for agitation. Encourage group participation, milieu, and working towards minimizing isolation.   Continue to encourage sobriety, relapse prevention- Treatment team looking  into possible rehab options.  As discussed with SW, paperwork/referral is being sent to [place], but states that legal issues may still be a reason for him not to be accepted - patient  is aware that this is a possibility.   Medical Decision Making:  Established Problem, Stable/Improving (1), Review of Psycho-Social Stressors (1), Review or order clinical lab tests (1) and Review of Medication Regimen & Side Effects (2)  Sheila May Agustin AGNP-BC 01/15/2015, 5:16 PM  I reviewed chart and agreed with the findings and treatment Plan.  Berniece Andreas, MD

## 2015-01-15 NOTE — Plan of Care (Signed)
Problem: Alteration in mood; excessive anxiety as evidenced by: Goal: STG-Pt will report an absence of self-harm thoughts/actions (Patient will report an absence of self-harm thoughts or actions)  Outcome: Not Progressing Patient reports passive suicidal ideations and verbally contracts with Clinical research associate and will seek staff if thoughts become to where he feels he will act upon them or needing to talk.

## 2015-01-15 NOTE — [facility] Group Notes (Signed)
[facility] Group Notes:  (Clinical Social Work)  01/15/2015     1:15-2:15PM  Summary of Progress/Problems:   The main focus of today's process group was to learn how to use a decisional balance exercise to move forward in the Stages of Change, which were described and discussed.  Motivational Interviewing and a worksheet were utilized to help patients explore in depth the perceived benefits and costs of unhealthy coping techniques, as well as the  benefits and costs of replacing that with a healthy coping skills.   The patient expressed that their unhealthy coping involves drugs, and he initially he said he does not have any healthy coping skills until CSW pointed out that he has previously said "walking."  He talked in group a little with insight, but did not stay for the entire group.  Type of Therapy:  Group Therapy - Process   Participation Level:  Minimal  Participation Quality:  Attentive  Affect:  Blunted and Depressed  Cognitive:  Alert  Insight:  Improving  Engagement in Therapy:  Improving  Modes of Intervention:  Education, Motivational Interviewing  Steven Mantle, LCSW 01/15/2015, 5:04 PM

## 2015-01-15 NOTE — Progress Notes (Signed)
Writer entered patient's room and observed him lying in bed resting. He reports that his day started off not so good but as the day progressed it got a little better. He reports that he was able to play a little basketball today which he enjoyed. He reports that he wants to rest for a little while. Nolan reports passive si and verbally contracts for safety. Support given, safety maintained on unit with 15 min checks.

## 2015-01-16 DIAGNOSIS — F329 Major depressive disorder, single episode, unspecified: Secondary | ICD-10-CM

## 2015-01-16 DIAGNOSIS — G4700 Insomnia, unspecified: Secondary | ICD-10-CM

## 2015-01-16 DIAGNOSIS — F419 Anxiety disorder, unspecified: Secondary | ICD-10-CM

## 2015-01-16 DIAGNOSIS — R45850 Homicidal ideations: Secondary | ICD-10-CM

## 2015-01-16 DIAGNOSIS — K219 Gastro-esophageal reflux disease without esophagitis: Secondary | ICD-10-CM

## 2015-01-16 DIAGNOSIS — F39 Unspecified mood [affective] disorder: Secondary | ICD-10-CM

## 2015-01-16 MED ORDER — GABAPENTIN 300 MG PO CAPS
600.0000 mg | ORAL_CAPSULE | Freq: Three times a day (TID) | ORAL | Status: DC
  Administered 2015-01-16 – 2015-01-17 (×2): 600 mg via ORAL
  Filled 2015-01-16 (×6): qty 2

## 2015-01-16 NOTE — Progress Notes (Signed)
Psychoeducational Group Note  Date:  01/16/2015 Time:  1015  Group Topic/Focus:  Making Healthy Choices:   The focus of this group is to help patients identify negative/unhealthy choices they were using prior to admission and identify positive/healthier coping strategies to replace them upon discharge.  Participation Level:  Active  Participation Quality:  Appropriate  Affect:  Appropriate  Cognitive:  Oriented  Insight:  Improving  Engagement in Group:  Engaged  Additional Comments:  Pt participated in the group, and was able to identify needs that they have. They also were able to identify areas that they needed to grow in.  Joniel Graumann A 01/16/2015 

## 2015-01-16 NOTE — [facility] Group Notes (Signed)
[facility] Group Notes: (Clinical Social Work)   01/16/2015      Type of Therapy:  Group Therapy   Participation Level:  Did Not Attend despite MHT prompting   Ailis Rigaud Grossman-Orr, LCSW 01/16/2015, 4:27 PM     

## 2015-01-16 NOTE — Progress Notes (Signed)
Patient ID: Steven Barker, male   DOB: [birth year], 29 y.o.   MRN: [id number] D: Client is visible on the unit, watching TV and interacting with peers. Client reports depression at "9" of 10. "I been sleeping most of the day, that's what people do when they are depressed" A: Writer introduced self to client, encouraged him to report any concerns. Reviewed medications, administered as prescribed. Staff will monitor q22min for safety. R: Client is safe on the unit, attended group.

## 2015-01-16 NOTE — Plan of Care (Signed)
Problem: Consults Goal: Substance Abuse Patient Education See Patient Education Module for education specifics.  Outcome: Completed/Met Date Met:  01/16/15 Nurse discussed substance abuse/coping skills with patient.

## 2015-01-16 NOTE — Progress Notes (Signed)
Patient ID: Steven Barker, male   DOB: December 15, 1985, 29 y.o.   MRN: [id number] [facility] MD Progress Note  01/16/2015 4:34 PM Steven Barker  MRN:  [id number] Subjective:   Patient states, "I'm depressed, I don't want to be around anybody, I've got no energy." and "i've gotten a little better but I think my neurontin needs to go back to the way Dr. Parke Poisson had it, even though I slept good." "I still plan to hang myself when I get out of here.""I still want to kill those drug dealers." But he does not disclose whom. My anxiety is way up!   Objective : I have discussed case with staff and have met with patient. He seems to be maximizing his symptoms out of proportion to his behaviors. He is focused on medication and displays medication seeking behaviors. He has been interacting with peers and attending some of the groups, but does not appear to be depressed 8/10. He states he is in pain, but walks without discomfort and sits comfortably during our discussion. He does not appear to be grimacing or shifting due to pain.   .Principal Problem: Substance induced mood disorder Diagnosis:   Patient Active Problem List   Diagnosis Date Noted  . Major depressive disorder, recurrent, severe without psychotic features [F33.2]   . Substance induced mood disorder [F19.94] 11/05/2014  . Suicidal ideation [R45.851] 11/05/2014  . Anxiety state, unspecified [F41.1] 01/01/2014  . 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 Rheumatoid arthritis [M06.9] 05/17/2013  . Polysubstance abuse [F19.10] 05/17/2013   Total Time spent with patient: 30 minutes   Past Medical History:  Past Medical History  Diagnosis Date  . Anxiety   . Depression   . Rheumatoid arthritis(714.0)    History reviewed. No pertinent past surgical history. Family History: History reviewed. No pertinent family history. Social History:  History  Alcohol Use No    Comment: last used over a month ago     History   Drug Use  . Yes  . Special: Marijuana, Cocaine    History   Social History  . Marital Status: Single    Spouse Name: N/A  . Number of Children: N/A  . Years of Education: N/A   Social History Main Topics  . Smoking status: Current Every Day Smoker -- 0.50 packs/day for 10 years    Types: Cigarettes  . Smokeless tobacco: Not on file  . Alcohol Use: No     Comment: last used over a month ago  . Drug Use: Yes    Special: Marijuana, Cocaine  . Sexual Activity: Yes    Birth Control/ Protection: None   Other Topics Concern  . None   Social History Narrative   Additional History:    Sleep:  States he slept better and did not have nightmares or vivid dreams now that he is off Trazodone.  Appetite: fair    Assessment: Patient appears to be trying to prolong his admission by maximizing his symptoms which do not appear to be congruent with his actions or behaviors when observed without his knowledge.  Musculoskeletal: Strength & Muscle Tone: within normal limits Gait & Station: normal Patient leans: N/A   Psychiatric Specialty Exam: Physical Exam  ROS- no akathisia,  No vomiting , nausea, no epigastralgia or melenas reported/endorsed   Blood pressure 113/86, pulse 79, temperature 97.3 F (36.3 C), temperature source Oral, resp. rate 16, height _0  (1.676 m), weight 148 lb (67.132 kg), SpO2 100 %.Body mass  index is 23.9 kg/(m^2).  General Appearance: Well Groomed  Engineer, water::  Good  Speech:  Normal Rate  Volume:  Normal  Mood:  Depressed  Affect:   Incongruent with stated level of depression  Thought Process:  Goal Directed and Linear  Orientation:  Other:  fully alert and attentive   Thought Content:  denies hallucinations, no delusions - not internally preoccupied   Suicidal Thoughts: Yes.  States plans to hang himself when discharged .  Homicidal Thoughts: Yes. Drug dealers  Memory:  Recent and Remote grossly intact  Judgement:  Fair  Insight:  Fair   Psychomotor Activity:  Normal  Concentration:  Fair  Recall:  Good  Fund of Knowledge:Good  Language: Good  Akathisia:  Negative  Handed:  Right  AIMS (if indicated):     Assets:  Desire for Improvement Resilience  ADL's:  Fair   Cognition: WNL  Sleep:  Number of Hours: 6.75     Current Medications: Current Facility-Administered Medications  Medication Dose Route Frequency Provider Last Rate Last Dose  . acetaminophen (TYLENOL) tablet 650 mg  650 mg Oral Q6H PRN Laverle Hobby, PA-C   650 mg at 01/16/15 2025  . alum & mag hydroxide-simeth (MAALOX/MYLANTA) 200-200-20 MG/5ML suspension 30 mL  30 mL Oral Q4H PRN Laverle Hobby, PA-C      . citalopram (CELEXA) tablet 40 mg  40 mg Oral Daily Kerrie Buffalo, NP   40 mg at 01/16/15 0736  . gabapentin (NEURONTIN) capsule 600 mg  600 mg Oral BID Kerrie Buffalo, NP   600 mg at 01/16/15 1630  . gabapentin (NEURONTIN) capsule 800 mg  800 mg Oral QHS Kerrie Buffalo, NP   800 mg at 01/15/15 2116  . hydrOXYzine (ATARAX/VISTARIL) tablet 50 mg  50 mg Oral Q6H PRN Laverle Hobby, PA-C   50 mg at 01/14/15 1945  . magnesium hydroxide (MILK OF MAGNESIA) suspension 30 mL  30 mL Oral Daily PRN Laverle Hobby, PA-C      . nicotine (NICODERM CQ - dosed in mg/24 hours) patch 21 mg  21 mg Transdermal Daily Laverle Hobby, PA-C   21 mg at 01/16/15 0735  . OLANZapine zydis (ZYPREXA) disintegrating tablet 5 mg  5 mg Oral TID PRN Kerrie Buffalo, NP   5 mg at 01/16/15 0817  . pantoprazole (PROTONIX) EC tablet 20 mg  20 mg Oral Daily Laverle Hobby, PA-C   20 mg at 01/16/15 0735  . QUEtiapine (SEROQUEL) tablet 150 mg  150 mg Oral q morning - 10a Jenne Campus, MD   150 mg at 01/16/15 0906  . QUEtiapine (SEROQUEL) tablet 300 mg  300 mg Oral QHS Myer Peer Cobos, MD   300 mg at 01/15/15 2111  . sucralfate (CARAFATE) 1 GM/10ML suspension 1 g  1 g Oral TID WC & HS Laverle Hobby, PA-C   1 g at 01/16/15 1630    Lab Results:  No results found for this or any  previous visit (from the past 28 hour(s)).  Physical Findings: AIMS: Facial and Oral Movements Muscles of Facial Expression: None, normal Lips and Perioral Area: None, normal Jaw: None, normal Tongue: None, normal,Extremity Movements Upper (arms, wrists, hands, fingers): None, normal Lower (legs, knees, ankles, toes): None, normal, Trunk Movements Neck, shoulders, hips: None, normal, Overall Severity Severity of abnormal movements (highest score from questions above): None, normal Incapacitation due to abnormal movements: None, normal Patient's awareness of abnormal movements (rate only patient's report): No Awareness, Dental  Status Current problems with teeth and/or dentures?: No Does patient usually wear dentures?: No  CIWA:  CIWA-Ar Total: 1 COWS:  COWS Total Score: 1    Assessment: Patient appears to be trying to prolong his admission by maximizing his symptoms which do not appear to be congruent with his actions or behaviors when observed without his knowledge.   Treatment Plan Summary: Daily contact with patient to assess and evaluate symptoms and progress in treatment, Medication management, Plan continue inpatient admission and continue medication management   CONTINUE  SEROQUEL 150 mgrs QAM and  300  mgrs QHS to address ANXIETY, MOOD DISORDER, INSOMNIA CONTINUE  NEURONTIN  600 mgrs TID to address PAIN/ANXIETY. CONTINUE  CELEXA 40 mgrs QAM to address  PERSISTENT DEPRESSION, PTSD SYMPTOMS. [place], PROTONIX to address history of GERD .  Continue Zyprexa PRN to 10 mg for agitation. Encourage group participation, milieu, and working towards minimizing isolation.   Continue to encourage sobriety, relapse prevention- Treatment team looking  into possible rehab options.  As discussed with SW, paperwork/referral is being sent to [place], but states that legal issues may still be a reason for him not to be accepted - patient is aware that this is a possibility.    Medical Decision Making:  Established Problem, Stable/Improving (1), Review of Psycho-Social Stressors (1), Review or order clinical lab tests (1) and Review of Medication Regimen & Side Effects (2)   Milta Deiters T. Mashburn RPAC 4:45 PM 01/16/2015  I reviewed chart and agreed with the findings and treatment Plan.  Berniece Andreas, MD

## 2015-01-16 NOTE — Progress Notes (Signed)
D:  Patient's self inventory sheet, patient sleeps good, sleep medication is helpful.  Good appetite, low energy level, poor concentration.  Rated depression 9, hopeleslsl and anxiety 10.  Denied withdrawals.  Has felt agitated and irritable.  SI, contracts for safety.  Physical problems pain, worst pain #8 in past 24 hours, knees, hips, all joints.  Pain medication is not helpful.  Goal is to stop bad thoughts.  "I have thoughts of killing drug dealers and whores.  All of them and myself."  A:  Medications administered per MD orders.  Emotional support and encouragement given patient. R:  SI and HI, contracts for safety.  SI to hang self and HI to people he lives near.  Denied A/V hallucinations.  Safety maintained with 15 minute checks.

## 2015-01-17 MED ORDER — GABAPENTIN 400 MG PO CAPS
400.0000 mg | ORAL_CAPSULE | Freq: Four times a day (QID) | ORAL | Status: DC
  Administered 2015-01-17 – 2015-01-18 (×4): 400 mg via ORAL
  Filled 2015-01-17 (×9): qty 1

## 2015-01-17 NOTE — Progress Notes (Signed)
Recreation Therapy Notes  Date: 01/17/15 Time: 9:30am Location: [address] Dayroom  Group Topic: Stress Management  Goal Area(s) Addresses:  Patient will verbalize importance of using healthy stress management.  Patient will identify positive emotions associated with healthy stress management.   Intervention: Stress Management  Activity :  Guided Imagery.  LRT introduced and educated patients on stress management technique of guided imagery.  Scripts were used to deliver the technique to patients, patients were asked to follow script read allowed by LRT to engage in practicing the stress management technique.  Education:  Stress Management, Discharge Planning.   Education Outcome: Needs additional education  Clinical Observations/Feedback:  Patient did not attend.   Karol Skarzynski, LRT/CTRS         Devi Hopman A 01/17/2015 3:48 PM 

## 2015-01-17 NOTE — Plan of Care (Signed)
Problem: Consults Goal: [facility] General Treatment Patient Education Outcome: Completed/Met Date Met:  01/17/15 Nurse discussed depression/coping skills with patient.

## 2015-01-17 NOTE — Progress Notes (Signed)
D:  Patient's self inventory sheet, patient has poor sleep, sleep medication was not helpful.  Good appetite, normal energy level, poor concentration.  Rated depression, anxiety and hopeless #8.  Denied withdrawals, but stated she does feel agitated, irritable.  SI, contracts for safety. SI sometimes, contracts for safety, no plan.  Physical pain, worst pain #6, knees.  Goal is to stop thoughts of hurting people.  Worst pain #6, knees.  Plan to talk to people.  "I have bad thoughts of hurting myself and others on the outside." A:  Medications administered per MD orders.  Emotional support and encouragement given patient. R:   Denied A/V hallucinations.  Safety maintained with 15 minute checks.

## 2015-01-17 NOTE — Progress Notes (Signed)
Patient ID: Steven Barker, male   DOB: 02/26/1986, 29 y.o.   MRN: [id number] D: Client reports depression as "5" of 10, indicated passive SI, "sometimes", but currently contracts for safety. "I been up all day, been to all the groups" "went out to play basketball and played cards outside. Client visible in the dayroom interacting with peers. A: Writer commended client on attending groups, encouraged him to stay with discharge plans to [facility] and then to long term treatment. Staff will monitor q53min for safety. R: Client is safe on the unit, attended group.

## 2015-01-17 NOTE — Progress Notes (Addendum)
Patient ID: Steven Barker, male   DOB: 1986/05/01, 29 y.o.   MRN: [id number] [facility] MD Progress Note  01/17/2015 12:20 PM Aurther Harlin  MRN:  [id number] Subjective:   Patient  States he is a little bit better today, but has continued to report depression, sadness .  Objective : I have discussed case with staff, reviewed chart notes,  and have met with patient. Patient has continued to describe depression, sadness, suicidal ruminations, although has contracted for safety on unit. He has also been reporting homicidal ideations towards  " Drug Dealer ". Staff has noted, however, that patient is engaging with select peers, and noticed fuller range of affect during said interactions. No disruptive behaviors on unit . Today patient states he is feeling "  A little better", although still depressed. Describes passive SI,  But denies any intention of hurting self at this time and is able to contract for safety. Describes HI towards drug dealer, although does not endorse actual intention or planning, and states " It's just that I am tired of him offering me drugs for free in order to get me hooked again". Denies medication side effects, but wants to change Neurontin dose to QID , as he feels it works better for him with this dosing pattern. Continues to experience some cravings- states he is motivated in recovery, sobriety.   .Principal Problem: Substance induced mood disorder Diagnosis:   Patient Active Problem List   Diagnosis Date Noted  . Major depressive disorder, recurrent, severe without psychotic features [F33.2]   . Substance induced mood disorder [F19.94] 11/05/2014  . Suicidal ideation [R45.851] 11/05/2014  . Anxiety state, unspecified [F41.1] 01/01/2014  . 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 Rheumatoid arthritis [M06.9] 05/17/2013  . Polysubstance abuse [F19.10] 05/17/2013   Total Time spent with patient: 20 minutes   Past Medical History:   Past Medical History  Diagnosis Date  . Anxiety   . Depression   . Rheumatoid arthritis(714.0)    History reviewed. No pertinent past surgical history. Family History: History reviewed. No pertinent family history. Social History:  History  Alcohol Use No    Comment: last used over a month ago     History  Drug Use  . Yes  . Special: Marijuana, Cocaine    History   Social History  . Marital Status: Single    Spouse Name: N/A  . Number of Children: N/A  . Years of Education: N/A   Social History Main Topics  . Smoking status: Current Every Day Smoker -- 0.50 packs/day for 10 years    Types: Cigarettes  . Smokeless tobacco: Not on file  . Alcohol Use: No     Comment: last used over a month ago  . Drug Use: Yes    Special: Marijuana, Cocaine  . Sexual Activity: Yes    Birth Control/ Protection: None   Other Topics Concern  . None   Social History Narrative   Additional History:    Sleep:  Improved   Appetite: improving    Assessment:   Musculoskeletal: Strength & Muscle Tone: within normal limits Gait & Station: normal Patient leans: N/A   Psychiatric Specialty Exam: Physical Exam  ROS- no nausea, no vomiting, no akathisia.   Blood pressure 124/90, pulse 92, temperature 98.4 F (36.9 C), temperature source Oral, resp. rate 18, height 5' 6"  (1.676 m), weight 148 lb (67.132 kg), SpO2 100 %.Body mass index is 23.9 kg/(m^2).  General Appearance: Fairly Groomed  Eye Contact::  Good  Speech:  Normal Rate  Volume:  Normal  Mood:  Describes ongoing depression  Affect:   Still constricted but at times reactive affect    Thought Process:  Goal Directed and Linear  Orientation:  Other:  fully alert and attentive   Thought Content:  denies hallucinations, no delusions - not internally preoccupied   Suicidal Thoughts:  Yes.  without intent/plan-  Able to contract for safety on the unit, and denies any plan or intention of hurting self at this time.  Homicidal  Thoughts:  Yes, towards " Drug Dealer", but without plan or intention at this time  Memory:  Recent and Remote grossly intact  Judgement:  Fair  Insight:  Fair  Psychomotor Activity:  Normal  Concentration:  Fair  Recall:  Good  Fund of Knowledge:Good  Language: Good  Akathisia:  Negative  Handed:  Right  AIMS (if indicated):     Assets:  Desire for Improvement Resilience  ADL's:  Fair   Cognition: WNL  Sleep:  Number of Hours: 6.75     Current Medications: Current Facility-Administered Medications  Medication Dose Route Frequency Provider Last Rate Last Dose  . acetaminophen (TYLENOL) tablet 650 mg  650 mg Oral Q6H PRN Laverle Hobby, PA-C   650 mg at 01/17/15 2094  . alum & mag hydroxide-simeth (MAALOX/MYLANTA) 200-200-20 MG/5ML suspension 30 mL  30 mL Oral Q4H PRN Laverle Hobby, PA-C      . citalopram (CELEXA) tablet 40 mg  40 mg Oral Daily Kerrie Buffalo, NP   40 mg at 01/17/15 0738  . gabapentin (NEURONTIN) capsule 400 mg  400 mg Oral QID Jenne Campus, MD   400 mg at 01/17/15 1203  . hydrOXYzine (ATARAX/VISTARIL) tablet 50 mg  50 mg Oral Q6H PRN Laverle Hobby, PA-C   50 mg at 01/14/15 1945  . magnesium hydroxide (MILK OF MAGNESIA) suspension 30 mL  30 mL Oral Daily PRN Laverle Hobby, PA-C      . nicotine (NICODERM CQ - dosed in mg/24 hours) patch 21 mg  21 mg Transdermal Daily Laverle Hobby, PA-C   21 mg at 01/17/15 0739  . OLANZapine zydis (ZYPREXA) disintegrating tablet 5 mg  5 mg Oral TID PRN Kerrie Buffalo, NP   5 mg at 01/17/15 0742  . pantoprazole (PROTONIX) EC tablet 20 mg  20 mg Oral Daily Laverle Hobby, PA-C   20 mg at 01/17/15 0739  . QUEtiapine (SEROQUEL) tablet 150 mg  150 mg Oral q morning - 10a Jenne Campus, MD   150 mg at 01/17/15 0932  . QUEtiapine (SEROQUEL) tablet 300 mg  300 mg Oral QHS Myer Peer Harriett Azar, MD   300 mg at 01/16/15 2110  . sucralfate (CARAFATE) 1 GM/10ML suspension 1 g  1 g Oral TID WC & HS Laverle Hobby, PA-C   1 g at  01/17/15 1202    Lab Results:  No results found for this or any previous visit (from the past 40 hour(s)).  Physical Findings: AIMS: Facial and Oral Movements Muscles of Facial Expression: None, normal Lips and Perioral Area: None, normal Jaw: None, normal Tongue: None, normal,Extremity Movements Upper (arms, wrists, hands, fingers): None, normal Lower (legs, knees, ankles, toes): None, normal, Trunk Movements Neck, shoulders, hips: None, normal, Overall Severity Severity of abnormal movements (highest score from questions above): None, normal Incapacitation due to abnormal movements: None, normal Patient's awareness of abnormal movements (rate only patient's report): No Awareness,  Dental Status Current problems with teeth and/or dentures?: No Does patient usually wear dentures?: No  CIWA:  CIWA-Ar Total: 1 COWS:  COWS Total Score: 1   Assessment- patient reports ongoing depression, and passive SI. Also has reported some vague HI towards his drug dealer, stating he is angry that this person tries to give him drugs for free in order to perpetuate addiction. Although still describing depression, does state he is better, and remains future oriented, wanting to to  [facility] after discharge. Also considered possible referral to an inpatient program in [place], but he stated he would rather stay closer to [place]. Tolerating medications well .   Treatment Plan Summary: Daily contact with patient to assess and evaluate symptoms and progress in treatment, Medication management, Plan continue inpatient admission and continue medication management   CONTINUE  SEROQUEL 150 mgrs QAM and  300  mgrs QHS to address ANXIETY, MOOD DISORDER, INSOMNIA CHANGE  NEURONTIN   To 400  mgrs QID to address PAIN/ANXIETY. CONTINUE  CELEXA 40 mgrs QAM to address  PERSISTENT DEPRESSION, PTSD SYMPTOMS. [place], PROTONIX to address history of GERD .  As per team, referrals to [facility] have been  made- patient states that this would be his optimal discharge plan.     Medical Decision Making:  Established Problem, Stable/Improving (1), Review of Psycho-Social Stressors (1) and Review of Medication Regimen & Side Effects (2)     Kimoni Pickerill 01/17/2015, 12:20 PM

## 2015-01-17 NOTE — [facility] Group Notes (Signed)
[facility] LCSW Aftercare Discharge Planning Group Note   01/17/2015 12:44 PM    Participation Quality:  Appropraite  Mood/Affect:  Appropriate  Depression Rating:  9  Anxiety Rating:  10  Thoughts of Suicide:  Yes  Will you contract for safety?  Yes  Current AVH:  No  Plan for Discharge/Comments:  Patient attended discharge planning group and actively participated in group. Patient advised he has been accepted for an admission assessment at [facility] on Jan 25, 2015.  Suicide prevention education reviewed and SPE document provided.   Transportation Means: Patient has transportation.   Supports:  Patient has a support system.   Alinna Siple, [place] July

## 2015-01-17 NOTE — [facility] Group Notes (Signed)
[facility] LCSW Group Therapy          Overcoming Obstacles       1:15 -2:30        01/17/2015       Type of Therapy:  Group Therapy  Participation Level:  Appropriate  Participation Quality:  Appropriate  Affect:  Appropriate  Cognitive:  Appropriate  Insight: Developing/Improving Engaged  Engagement in Therapy: Developing/Imprvoing Engaged  Modes of Intervention:  Discussion Exploration  Education Rapport BuildingProblem-Solving Support  Summary of Progress/Problems:  The main focus of today's group was overcoming obstacles. He advised the obstacle is has to overcome is using drugs   identify appropriate coping skills inc   [name] 01/17/2015

## 2015-01-18 DIAGNOSIS — F1320 Sedative, hypnotic or anxiolytic dependence, uncomplicated: Secondary | ICD-10-CM | POA: Insufficient documentation

## 2015-01-18 MED ORDER — GABAPENTIN 400 MG PO CAPS: 400.0000 mg | ORAL_CAPSULE | Freq: Four times a day (QID) | ORAL | Status: DC

## 2015-01-18 MED ORDER — PANTOPRAZOLE SODIUM 20 MG PO TBEC: 20.0000 mg | DELAYED_RELEASE_TABLET | Freq: Every day | ORAL | Status: DC

## 2015-01-18 MED ORDER — HYDROXYZINE HCL 50 MG PO TABS: 50.0000 mg | ORAL_TABLET | Freq: Four times a day (QID) | ORAL | Status: DC | PRN

## 2015-01-18 MED ORDER — CITALOPRAM HYDROBROMIDE 40 MG PO TABS: 40.0000 mg | ORAL_TABLET | Freq: Every day | ORAL | Status: DC

## 2015-01-18 MED ORDER — QUETIAPINE FUMARATE 50 MG PO TABS: 150.0000 mg | ORAL_TABLET | Freq: Every morning | ORAL | Status: DC

## 2015-01-18 MED ORDER — QUETIAPINE FUMARATE 300 MG PO TABS: 300.0000 mg | ORAL_TABLET | Freq: Every day | ORAL | Status: DC

## 2015-01-18 MED ORDER — SUCRALFATE 1 GM/10ML PO SUSP: 1.0000 g | Freq: Three times a day (TID) | ORAL | Status: DC

## 2015-01-18 MED ORDER — NICOTINE 21 MG/24HR TD PT24: 21.0000 mg | MEDICATED_PATCH | Freq: Every day | TRANSDERMAL | Status: DC

## 2015-01-18 NOTE — Progress Notes (Signed)
Adult Psychoeducational Group Note  Date:  01/18/2015 Time:  0900 Group Topic/Focus:  Orientation:   The focus of this group is to educate the patient on the purpose and policies of crisis stabilization and provide a format to answer questions about their admission.  The group details unit policies and expectations of patients while admitted.  Participation Level:  Active  Participation Quality:  Appropriate  Affect:  Appropriate  Cognitive:  Appropriate  Insight: Appropriate  Engagement in Group:  Engaged  Modes of Intervention:  Education  Additional Comments:    Jamice Carreno L 01/18/2015, 10:10 AM

## 2015-01-18 NOTE — Tx Team (Signed)
Interdisciplinary Treatment Plan Update (Adult)  Date:  01/18/2015  Time Reviewed:  9:30 AM   Progress in Treatment: Attending groups: Patient is attending groups. Participating in groups:  Patient engages in discussion Taking medication as prescribed:  Patient is taking medications Tolerating medication:  Patient is tolerating medications Family/Significant othe contact made:   No, patient declined collateral contact Patient understands diagnosis:Yes, patient understands diagnosis and need for treatment Discussing patient identified problems/goals with staff:  Yes, patient is able to express goals/problems Medical problems stabilized or resolved:  Yes Denies suicidal/homicidal ideation: No. Patient endorses SI but contracts for safety Issues/concerns per patient self-inventory:   Other:   Discharge Plan or Barriers:    5/24: Discharge home with family and follow up with [facility].  Reason for Continuation of Hospitalization: Anxiety Depression Medication stabilization Suicidal ideation  Comments:    Continue medication stabilization  Additional comments:  Patient and CSW reviewed Patient Discharge Process Letter/Patient Involvement Form.  Patient verbalized understanding and signed form.  Patient and CSW also reviewed and identified patient's goals and treatment plan.  Patient verbalized understanding and agreed to plan.  Estimated length of stay: Discharge anticipated for today 01/18/15.   Review of initial/current patient goals per problem list:  Please see plan of care.   Attendees: Patient:    Family:    Physician: Dr. Jama Flavors; Dr. Dub Mikes 01/18/2015 9:30 AM  Nursing: Horton Chin, Kathi Simpers, RN 01/18/2015 9:30 AM  Clinical Social Worker: Samuella Bruin,  LCSWA 01/18/2015 9:30 AM  Other: Chad Cordial, LCSWA  01/18/2015 9:30 AM  Other: Leisa Lenz, Vesta Mixer Liaison 01/18/2015 9:30 AM  Other: Onnie Boer, Case Manager 01/18/2015 9:30 AM  Other:  Serena Colonel, May Augustin, NP 01/18/2015 9:30 AM  Other:    Other:    Other:    Other:    Other:      Samuella Bruin, MSW, LCSWA Clinical Social Worker [facility] [phone]

## 2015-01-18 NOTE — Progress Notes (Signed)
Patient ID: Steven Barker, male   DOB: [birth year], 29 y.o.   MRN: [id number]   Pt discharged home with bus passes. Pt was stable at this time. All papers and prescriptions were given and valuables returned. Verbal understanding expressed. Denies SI/HI and A/VH. Pt given opportunity to express concerns and ask questions.

## 2015-01-18 NOTE — Progress Notes (Signed)
Patient ID: Steven Barker, male   DOB: 1985/09/17, 29 y.o.   MRN: [id number]  Pt currently presents with a blunted affect and anxious behavior. Per self inventory, pt rates depression at a 6, hopelessness 6 and anxiety 10. Pt's daily goal is "panic attacks" and they intend to do so by "deep breaths." Pt reports fair sleep, poor concentration and a good appetite. Pt sarcastic and intrusive in other patients treatment.   Pt provided with medications per providers orders. Pt's labs and vitals were monitored throughout the day. Pt supported emotionally and encouraged to express concerns and questions. Pt educated on medications and substance abuse.   Pt's safety ensured with 15 minute and environmental checks. Pt reports SI on daily inventory sheet but states "not really, no plan" to Clinical research associate. Pt currently denies HI and A/V hallucinations. Pt verbally agrees to seek staff if HI or A/VH occurs and to consult with staff before acting on SI thoughts. Pt to be discharged today. Pt states "The first thing I'm going to do is smoke a ciggarrette." Pt also states "I don't know what I'll do for the next couple of days before I go to my follow up..." Pt encouraged to seek support groups within the community during that time.

## 2015-01-18 NOTE — Discharge Summary (Signed)
Physician Discharge Summary Note  Patient:  Steven Barker is an 29 y.o., male MRN:  [id number] DOB:  [birth year] Patient phone:  [phone] (home)  Patient address:   [address] [place] [address],  Total Time spent with patient: 45 minutes  Date of Admission:  01/11/2015 Date of Discharge: 01/18/2015  Reason for Admission:  Patient took a "bunch of Klonopin and Xanax"  Principal Problem: Substance induced mood disorder Discharge Diagnoses: Patient Active Problem List   Diagnosis Date Noted  . Benzodiazepine dependence [F13.20]   . Major depressive disorder, recurrent, severe without psychotic features [F33.2]   . Substance induced mood disorder [F19.94] 11/05/2014  . Suicidal ideation [R45.851] 11/05/2014  . Anxiety state, unspecified [F41.1] 01/01/2014  . 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 Rheumatoid arthritis [M06.9] 05/17/2013  . Polysubstance abuse [F19.10] 05/17/2013   Musculoskeletal: Strength & Muscle Tone: within normal limits Gait & Station: normal Patient leans: N/A  Psychiatric Specialty Exam:  SEE SRA Physical Exam  Vitals reviewed. Psychiatric: His mood appears anxious.    Review of Systems  Constitutional: Negative for fever and chills.  Cardiovascular: Negative for chest pain.  Gastrointestinal: Negative for nausea.  Neurological: Negative for dizziness and headaches.  Psychiatric/Behavioral: The patient is nervous/anxious.   All other systems reviewed and are negative.   Blood pressure 114/80, pulse 88, temperature 97.5 F (36.4 C), temperature source Oral, resp. rate 18, height 5\' 6"  (1.676 m), weight 67.132 kg (148 lb), SpO2 100 %.Body mass index is 23.9 kg/(m^2).  Have you used any form of tobacco in the last 30 days? (Cigarettes, Smokeless Tobacco, Cigars, and/or Pipes): Yes  Has this patient used any form of tobacco in the last 30 days? (Cigarettes, Smokeless Tobacco, Cigars, and/or Pipes) Yes,  Prescription not provided because: SAMPLES ACTUALLY GIVEN AND RX TO BE FILLED BY PHARMACY OF THEIR CHOICE  Past Medical History:  Past Medical History  Diagnosis Date  . Anxiety   . Depression   . Rheumatoid arthritis(714.0)    History reviewed. No pertinent past surgical history. Family History: History reviewed. No pertinent family history. Social History:  History  Alcohol Use No    Comment: last used over a month ago     History  Drug Use  . Yes  . Special: Marijuana, Cocaine    History   Social History  . Marital Status: Single    Spouse Name: N/A  . Number of Children: N/A  . Years of Education: N/A   Social History Main Topics  . Smoking status: Current Every Day Smoker -- 0.50 packs/day for 10 years    Types: Cigarettes  . Smokeless tobacco: Not on file  . Alcohol Use: No     Comment: last used over a month ago  . Drug Use: Yes    Special: Marijuana, Cocaine  . Sexual Activity: Yes    Birth Control/ Protection: None   Other Topics Concern  . None   Social History Narrative    Risk to Self: Is patient at risk for suicide?: Yes What has been your use of drugs/alcohol within the last 12 months?: Patient reports using up to two grams of ccaine daily ; five to six Xanax and smoking five/six blunts per day Risk to Others:   Prior Inpatient Therapy:   Prior Outpatient Therapy:    Level of Care:  OP  Hospital Course:  Steven Barker, 29 yo male, reported a history of substance dependence. He stated being sober for a  period of time but recently experienced panic attacks.  He impulsively took some Klonopin and Xanax to relieve his panic symptoms that turned into suicidal ideations.  He went into a blackout and he was unable to recall the events.  He reported depression, anxiety , and PTSD symptoms, stemming from a prior overdose which led to aspiration and prolonged ICU treatment/intubation.  His intrusive memories, recollections, and nightmares did improve during  stay. Steven Barker was admitted for Substance induced mood disorder and crisis management.  He was treated discharged with the medications listed below under Medication List.  Medical problems were identified and treated as needed.  Home medications were restarted as appropriate.  Improvement was monitored by observation and Steven Barker daily report of symptom reduction.  Emotional and mental status was monitored by daily self-inventory reports completed by Steven Barker and clinical staff.         Steven Barker was evaluated by the treatment team for stability and plans for continued recovery upon discharge.  Steven Barker motivation was an integral factor for scheduling further treatment.  Employment, transportation, bed availability, health status, family support, and any pending legal issues were also considered during his hospital stay.  He was offered further treatment options upon discharge including but not limited to Residential, Intensive Outpatient, and Outpatient treatment.  Steven Barker will follow up with the services as listed below under Follow Up Information.     Upon completion of this admission the patient was both mentally and medically stable for discharge denying suicidal/homicidal ideation, auditory/visual/tactile hallucinations, delusional thoughts and paranoia.       Consults:  psychiatry  Significant Diagnostic Studies:  labs: per ED  Discharge Vitals:   Blood pressure 114/80, pulse 88, temperature 97.5 F (36.4 C), temperature source Oral, resp. rate 18, height 5\' 6"  (1.676 m), weight 67.132 kg (148 lb), SpO2 100 %. Body mass index is 23.9 kg/(m^2). Lab Results:   No results found for this or any previous visit (from the past 72 hour(s)).  Physical Findings: AIMS: Facial and Oral Movements Muscles of Facial Expression: None, normal Lips and Perioral Area: None, normal Jaw: None, normal Tongue: None, normal,Extremity Movements Upper (arms, wrists, hands,  fingers): None, normal Lower (legs, knees, ankles, toes): None, normal, Trunk Movements Neck, shoulders, hips: None, normal, Overall Severity Severity of abnormal movements (highest score from questions above): None, normal Incapacitation due to abnormal movements: None, normal Patient's awareness of abnormal movements (rate only patient's report): No Awareness, Dental Status Current problems with teeth and/or dentures?: No Does patient usually wear dentures?: No  CIWA:  CIWA-Ar Total: 1 COWS:  COWS Total Score: 1   See Psychiatric Specialty Exam and Suicide Risk Assessment completed by Attending Physician prior to discharge.  Discharge destination:  Home  Is patient on multiple antipsychotic therapies at discharge:  No   Has Patient had three or more failed trials of antipsychotic monotherapy by history:  No    Recommended Plan for Multiple Antipsychotic Therapies: NA     Medication List    STOP taking these medications        OLANZapine zydis 5 MG disintegrating tablet  Commonly known as:  ZYPREXA     traZODone 100 MG tablet  Commonly known as:  DESYREL      TAKE these medications      Indication   citalopram 40 MG tablet  Commonly known as:  CELEXA  Take 1 tablet (40 mg total) by mouth daily.   Indication:  Depression  gabapentin 400 MG capsule  Commonly known as:  NEURONTIN  Take 1 capsule (400 mg total) by mouth 4 (four) times daily.   Indication:  Agitation, Cocaine Dependence     hydrOXYzine 50 MG tablet  Commonly known as:  ATARAX/VISTARIL  Take 1 tablet (50 mg total) by mouth every 6 (six) hours as needed for anxiety.   Indication:  Anxiety Neurosis     nicotine 21 mg/24hr patch  Commonly known as:  NICODERM CQ - dosed in mg/24 hours  Place 1 patch (21 mg total) onto the skin daily.   Indication:  Nicotine Addiction     pantoprazole 20 MG tablet  Commonly known as:  PROTONIX  Take 1 tablet (20 mg total) by mouth daily.   Indication:   Gastroesophageal Reflux Disease     QUEtiapine 300 MG tablet  Commonly known as:  SEROQUEL  Take 1 tablet (300 mg total) by mouth at bedtime.   Indication:  mood control     QUEtiapine 50 MG tablet  Commonly known as:  SEROQUEL  Take 3 tablets (150 mg total) by mouth every morning.   Indication:  MOOD CONTROL     sucralfate 1 GM/10ML suspension  Commonly known as:  CARAFATE  Take 10 mLs (1 g total) by mouth 4 (four) times daily -  with meals and at bedtime.   Indication:  Gastroesophageal Reflux Disease           Follow-up Information    Follow up with [facility] On 01/18/2015.   Contact information:   [address] [place], [place]   [address]  [phone]      Follow up with [facility] On 01/25/2015.   Why:  Tuesday, Jan 25, 2015 at 8 AM   Contact information:   [address] [place], [place]   [address]  [phone]      Follow-up recommendations:  Activity:  as tol, diet as tol  Comments:  1.  Take all your medications as prescribed.              2.  Report any adverse side effects to outpatient provider.                       3.  Patient instructed to not use alcohol or illegal drugs while on prescription medicines.            4.  In the event of worsening symptoms, instructed patient to call 911, the crisis hotline or go to nearest emergency room for evaluation of symptoms.  Total Discharge Time: 40 min  Signed: Velna Hatchet May Omari Koslosky AGNP-BC 01/18/2015, 10:57 AM   Patient seen, Suicide Assessment Completed.  Disposition Plan Reviewed

## 2015-01-18 NOTE — [facility] Suicide Risk Assessment (Signed)
[facility] Discharge Suicide Risk Assessment   Demographic Factors:  29 year old single male, no children, plans to live with father,grandmother   Total Time spent with patient: 30 minutes  Musculoskeletal: Strength & Muscle Tone: within normal limits Gait & Station: normal Patient leans: N/A  Psychiatric Specialty Exam: Physical Exam  ROS  Blood pressure 114/80, pulse 88, temperature 97.5 F (36.4 C), temperature source Oral, resp. rate 18, height 5\' 6"  (1.676 m), weight 148 lb (67.132 kg), SpO2 100 %.Body mass index is 23.9 kg/(m^2).  General Appearance: Well Groomed  Eye Contact::  Good  Speech:  Normal Rate409  Volume:  Normal  Mood:  describes mood is improved, denies depression at present  Affect:  Appropriate and more reactive   Thought Process:  Goal Directed and Linear  Orientation:  Full (Time, Place, and Person)  Thought Content:  linear   Suicidal Thoughts:  No- denies any suicidal or homicidal ideations.   Homicidal Thoughts:  No  Memory:  recent and remote grossly intact   Judgement:  Fair  Insight:  Present  Psychomotor Activity:  Normal  Concentration:  Good  Recall:  Good  Fund of Knowledge:Good  Language: Good  Akathisia:  Negative  Handed:  Right  AIMS (if indicated):     Assets:  Desire for Improvement Resilience  Sleep:  Number of Hours: 6.5  Cognition: WNL  ADL's: improved    Have you used any form of tobacco in the last 30 days? (Cigarettes, Smokeless Tobacco, Cigars, and/or Pipes): Yes  Has this patient used any form of tobacco in the last 30 days? (Cigarettes, Smokeless Tobacco, Cigars, and/or Pipes) Yes, A prescription for an FDA-approved tobacco cessation medication was offered at discharge and the patient refused  Mental Status Per Nursing Assessment::   On Admission:     Current Mental Status by Physician: At this time patient is improved compared to admission- he is alert and attentive, well groomed, mood is improved, denies depression at  this time, affect improved, no thought disorder, no hallucinations, no delusions, future oriented.   Loss Factors: Legal issues , unemployed , financial stressors   Historical Factors: Several prior psychiatric admissions for depression- history of cocaine and BZD Abuse   Risk Reduction Factors:   Sense of responsibility to family, Living with another person, especially a relative, Positive social support and Positive coping skills or problem solving skills  Continued Clinical Symptoms:   As noted, patient is improved compared to admission. Denies any medication side effects. At this time he is future oriented, plans to return home to live with father and grandmother, and environment which he states is supportive. States that if his brother who is in active addiction is there, he then plans to stay with a sober friend, until 5/31, at which time he is planning to go to [facility]. States his plan is to try to get Visiting Nurse for his grandmother, and states he plans to get a job .  Cognitive Features That Contribute To Risk:  No gross cognitive deficits noted upon discharge. Is alert , attentive, and oriented x 3   Suicide Risk:  Mild:  Suicidal ideation of limited frequency, intensity, duration, and specificity.  There are no identifiable plans, no associated intent, mild dysphoria and related symptoms, good self-control (both objective and subjective assessment), few other risk factors, and identifiable protective factors, including available and accessible social support.  Principal Problem: Substance induced mood disorder Discharge Diagnoses:  Patient Active Problem List   Diagnosis  Date Noted  . Major depressive disorder, recurrent, severe without psychotic features [F33.2]   . Substance induced mood disorder [F19.94] 11/05/2014  . Suicidal ideation [R45.851] 11/05/2014  . Anxiety state, unspecified [F41.1] 01/01/2014  . MDD (major depressive disorder), recurrent  episode, severe [F33.2] 12/31/2013  . Rheumatoid arthritis [M06.9] 05/17/2013  . Polysubstance abuse [F19.10] 05/17/2013    Follow-up Information    Follow up with [facility] On 01/18/2015.   Contact information:   [address] [place], [place]   [address]  [phone]      Follow up with [facility] On 01/25/2015.   Why:  Tuesday, Jan 25, 2015 at 8 AM   Contact information:   [address] [place], [place]   [address]  [phone]      Plan Of Care/Follow-up recommendations:  Activity:  as tolerated Diet:  Regular Tests:  NA Other:  See below   Is patient on multiple antipsychotic therapies at discharge:  No   Has Patient had three or more failed trials of antipsychotic monotherapy by history:  No  Recommended Plan for Multiple Antipsychotic Therapies: NA   Patient is requesting discharge, leaving in good spirits. Plans to go to [facility] on 5/31 as above . Plans to attend NA meetings .     Alisan Dokes 01/18/2015, 10:39 AM

## 2015-01-18 NOTE — Progress Notes (Signed)
  [facility] Adult Case Management Discharge Plan :  Will you be returning to the same living situation after discharge:  Yes,  Patient plans to return home At discharge, do you have transportation home?: Yes,  patient will be provided with bus pass Do you have the ability to pay for your medications: Yes,  patient will be provided with medication samples and prescriptions at discharge  Release of information consent forms completed and in the chart;  Patient's signature needed at discharge.  Patient to Follow up at: Follow-up Information    Follow up with [facility].   Why:  Walk-in clinic Monday-Friday between 8 am to 3 pm for therapy and medication management services.   Contact information:   [address] [place], [place]   [address]  [phone]      Follow up with [facility] On 01/25/2015.   Why:  Tuesday, Jan 25, 2015 at 8 AM   Contact information:   [address] [place], [place]   [address]  [phone]      Patient denies SI/HI: Yes,  denies    Safety Planning and Suicide Prevention discussed: Yes, with patient  Have you used any form of tobacco in the last 30 days? (Cigarettes, Smokeless Tobacco, Cigars, and/or Pipes): Yes  Has patient been referred to the Quitline?: Patient refused referral  Kelena Garrow, [place] 01/18/2015, 11:13 AM

## 2015-04-06 ENCOUNTER — Encounter (HOSPITAL_COMMUNITY): Payer: Self-pay | Admitting: Emergency Medicine

## 2015-04-06 ENCOUNTER — Emergency Department (HOSPITAL_COMMUNITY)
Admission: EM | Admit: 2015-04-06 | Discharge: 2015-04-11 | Disposition: A | Discharge status: Home / Self Care | Payer: Federal, State, Local not specified - Other | Attending: Emergency Medicine | Admitting: Emergency Medicine

## 2015-04-06 DIAGNOSIS — F1410 Cocaine abuse, uncomplicated: Secondary | ICD-10-CM | POA: Insufficient documentation

## 2015-04-06 DIAGNOSIS — F332 Major depressive disorder, recurrent severe without psychotic features: Secondary | ICD-10-CM | POA: Insufficient documentation

## 2015-04-06 DIAGNOSIS — F1320 Sedative, hypnotic or anxiolytic dependence, uncomplicated: Secondary | ICD-10-CM | POA: Insufficient documentation

## 2015-04-06 DIAGNOSIS — T1491XA Suicide attempt, initial encounter: Secondary | ICD-10-CM

## 2015-04-06 DIAGNOSIS — Z720 Tobacco use: Secondary | ICD-10-CM | POA: Insufficient documentation

## 2015-04-06 DIAGNOSIS — Z79899 Other long term (current) drug therapy: Secondary | ICD-10-CM | POA: Insufficient documentation

## 2015-04-06 DIAGNOSIS — F1210 Cannabis abuse, uncomplicated: Secondary | ICD-10-CM | POA: Insufficient documentation

## 2015-04-06 DIAGNOSIS — F419 Anxiety disorder, unspecified: Secondary | ICD-10-CM | POA: Insufficient documentation

## 2015-04-06 DIAGNOSIS — Z8739 Personal history of other diseases of the musculoskeletal system and connective tissue: Secondary | ICD-10-CM | POA: Insufficient documentation

## 2015-04-06 DIAGNOSIS — T1491 Suicide attempt: Secondary | ICD-10-CM | POA: Insufficient documentation

## 2015-04-06 DIAGNOSIS — X58XXXA Exposure to other specified factors, initial encounter: Secondary | ICD-10-CM | POA: Insufficient documentation

## 2015-04-06 DIAGNOSIS — F1110 Opioid abuse, uncomplicated: Secondary | ICD-10-CM | POA: Insufficient documentation

## 2015-04-06 DIAGNOSIS — F1910 Other psychoactive substance abuse, uncomplicated: Secondary | ICD-10-CM

## 2015-04-06 LAB — CBC
HEMATOCRIT: 47.9 % (ref 39.0–52.0)
HEMOGLOBIN: 16.1 g/dL (ref 13.0–17.0)
MCH: 31 pg (ref 26.0–34.0)
MCHC: 33.6 g/dL (ref 30.0–36.0)
MCV: 92.3 fL (ref 78.0–100.0)
Platelets: 232 10*3/uL (ref 150–400)
RBC: 5.19 MIL/uL (ref 4.22–5.81)
RDW: 13.4 % (ref 11.5–15.5)
WBC: 9.5 10*3/uL (ref 4.0–10.5)

## 2015-04-06 LAB — ETHANOL: Alcohol, Ethyl (B): <5 mg/dL (ref <5)

## 2015-04-06 LAB — COMPREHENSIVE METABOLIC PANEL
ALK PHOS: 61 U/L (ref 38–126)
ALT: 25 U/L (ref 17–63)
ANION GAP: 10 (ref 5–15)
AST: 32 U/L (ref 15–41)
Albumin: 4.6 g/dL (ref 3.5–5.0)
BUN: 14 mg/dL (ref 6–20)
CO2: 31 mmol/L (ref 22–32)
Calcium: 9.8 mg/dL (ref 8.9–10.3)
Chloride: 99 mmol/L — ABNORMAL LOW (ref 101–111)
Creatinine, Ser: 1.07 mg/dL (ref 0.61–1.24)
GFR calc Af Amer: >60 mL/min (ref >60)
GLUCOSE: 99 mg/dL (ref 65–99)
POTASSIUM: 3.7 mmol/L (ref 3.5–5.1)
Sodium: 140 mmol/L (ref 135–145)
TOTAL PROTEIN: 7.9 g/dL (ref 6.5–8.1)
Total Bilirubin: 0.5 mg/dL (ref 0.3–1.2)

## 2015-04-06 LAB — SALICYLATE LEVEL: Salicylate Lvl: <4 mg/dL (ref 2.8–30.0)

## 2015-04-06 LAB — RAPID URINE DRUG SCREEN, HOSP PERFORMED
AMPHETAMINES: NOT DETECTED
Barbiturates: NOT DETECTED
Benzodiazepines: NOT DETECTED
Cocaine: POSITIVE — AB
Opiates: POSITIVE — AB
TETRAHYDROCANNABINOL: POSITIVE — AB

## 2015-04-06 LAB — ACETAMINOPHEN LEVEL

## 2015-04-06 MED ORDER — NICOTINE 21 MG/24HR TD PT24
21.0000 mg | MEDICATED_PATCH | Freq: Every day | TRANSDERMAL | Status: DC
  Administered 2015-04-06: 21 mg via TRANSDERMAL
  Filled 2015-04-06 (×2): qty 1

## 2015-04-06 MED ORDER — ZOLPIDEM TARTRATE 5 MG PO TABS: 5.0000 mg | ORAL_TABLET | Freq: Every evening | ORAL | Status: DC | PRN

## 2015-04-06 MED ORDER — PANTOPRAZOLE SODIUM 20 MG PO TBEC
20.0000 mg | DELAYED_RELEASE_TABLET | Freq: Every day | ORAL | Status: DC
  Administered 2015-04-06 – 2015-04-10 (×5): 20 mg via ORAL
  Filled 2015-04-06 (×7): qty 1

## 2015-04-06 MED ORDER — NICOTINE 21 MG/24HR TD PT24
21.0000 mg | MEDICATED_PATCH | Freq: Every day | TRANSDERMAL | Status: DC
  Administered 2015-04-07 – 2015-04-10 (×4): 21 mg via TRANSDERMAL
  Filled 2015-04-06 (×4): qty 1

## 2015-04-06 MED ORDER — ACETAMINOPHEN 325 MG PO TABS: 650.0000 mg | ORAL_TABLET | ORAL | Status: DC | PRN

## 2015-04-06 MED ORDER — GABAPENTIN 400 MG PO CAPS
400.0000 mg | ORAL_CAPSULE | Freq: Four times a day (QID) | ORAL | Status: DC
  Administered 2015-04-06 – 2015-04-11 (×19): 400 mg via ORAL
  Filled 2015-04-06 (×19): qty 1

## 2015-04-06 MED ORDER — IBUPROFEN 200 MG PO TABS
600.0000 mg | ORAL_TABLET | Freq: Three times a day (TID) | ORAL | Status: DC | PRN
  Administered 2015-04-08 – 2015-04-09 (×2): 600 mg via ORAL
  Filled 2015-04-06 (×2): qty 3

## 2015-04-06 MED ORDER — CITALOPRAM HYDROBROMIDE 40 MG PO TABS
40.0000 mg | ORAL_TABLET | Freq: Every day | ORAL | Status: DC
  Administered 2015-04-06 – 2015-04-11 (×6): 40 mg via ORAL
  Filled 2015-04-06 (×6): qty 1

## 2015-04-06 MED ORDER — HYDROXYZINE HCL 25 MG PO TABS
50.0000 mg | ORAL_TABLET | Freq: Four times a day (QID) | ORAL | Status: DC | PRN
  Administered 2015-04-07 – 2015-04-10 (×6): 50 mg via ORAL
  Filled 2015-04-06 (×6): qty 2

## 2015-04-06 MED ORDER — LORAZEPAM 1 MG PO TABS: 1.0000 mg | ORAL_TABLET | Freq: Three times a day (TID) | ORAL | Status: DC | PRN

## 2015-04-06 MED ORDER — ALUM & MAG HYDROXIDE-SIMETH 200-200-20 MG/5ML PO SUSP: 30.0000 mL | ORAL | Status: DC | PRN

## 2015-04-06 MED ORDER — SUCRALFATE 1 GM/10ML PO SUSP
1.0000 g | Freq: Three times a day (TID) | ORAL | Status: DC
  Administered 2015-04-06 – 2015-04-11 (×18): 1 g via ORAL
  Filled 2015-04-06 (×23): qty 10

## 2015-04-06 MED ORDER — ONDANSETRON HCL 4 MG PO TABS
4.0000 mg | ORAL_TABLET | Freq: Three times a day (TID) | ORAL | Status: DC | PRN
Start: 2015-04-06 — End: 2015-04-11

## 2015-04-06 MED ORDER — QUETIAPINE FUMARATE 300 MG PO TABS
300.0000 mg | ORAL_TABLET | Freq: Every day | ORAL | Status: DC
Start: 2015-04-06 — End: 2015-04-11
  Administered 2015-04-06 – 2015-04-10 (×5): 300 mg via ORAL
  Filled 2015-04-06 (×5): qty 1

## 2015-04-06 NOTE — ED Notes (Signed)
Security at bedside to wand pt/belongings.  [employer] remains present.  Pt remains calm/coop at this time.  Denies needs.  NAD.

## 2015-04-06 NOTE — ED Provider Notes (Signed)
CSN: [id number]     Arrival date & time 04/06/15  1653 History   First MD Initiated Contact with Patient 04/06/15 1723     Chief Complaint  Patient presents with  . Depression    x2-3 weeks  . Suicide Attempt    had a rope tied around his neck, attempted to jump off his deck, stopped by his brother     (Consider location/radiation/quality/duration/timing/severity/associated sxs/prior Treatment) HPI    PCP: No PCP Per Patient Blood pressure 116/62, pulse 72, temperature 98.2 F (36.8 C), temperature source Oral, resp. rate 20, height 5\' 8"  (1.727 m), weight 137 lb (62.143 kg), SpO2 97 %.  Steven Barker is a 29 y.o.male with a significant PMH of anxiety, depression, and rheumatoid arthritis presents to the ER with complaints of suicide attempt. The patient reports a history of polysubstance abuse, depression and anxiety. Yesterday he reports seeing his dad attempt to commit suicide. He reports seeing blood all over the place. This morning he wrapped a rope around his neck when he thought that everyone in the house was asleep.  Then he hurt his brother scream turned around his brother pulled him off the balcony in his house. He reports that he has been a 36 before coming here. He denies any other injuries. He takes Protonix and Carafate for ulcers and Seroquel present anxiety depression. Denies that he takes any medicine for rheumatoid arthritis. Denies hallucinations or delusions denies any homicidal ideations. Denies having any pain. He reports the rope was loose and he did not jump off the ledge, therefore there was no tension ever applied on the rope around his neck.  The patient denies diaphoresis, fever, headache, weakness (general or focal), confusion, change of vision,  neck pain, dysphagia, aphagia, chest pain, shortness of breath,  back pain, abdominal pains, nausea, vomiting, diarrhea, lower extremity swelling, rash.   Past Medical History  Diagnosis Date  . Anxiety   .  Depression   . Rheumatoid arthritis(714.0)    History reviewed. No pertinent past surgical history. No family history on file. Social History  Substance Use Topics  . Smoking status: Current Every Day Smoker -- 0.50 packs/day for 10 years    Types: Cigarettes  . Smokeless tobacco: None  . Alcohol Use: Yes     Comment: occasionally    Review of Systems  10 Systems reviewed and are negative for acute change except as noted in the HPI.   Allergies  Clindamycin/lincomycin  Home Medications   Prior to Admission medications   Medication Sig Start Date End Date Taking? Authorizing Provider  citalopram (CELEXA) 40 MG tablet Take 1 tablet (40 mg total) by mouth daily. 01/18/15  Yes 01/20/15, NP  gabapentin (NEURONTIN) 400 MG capsule Take 1 capsule (400 mg total) by mouth 4 (four) times daily. 01/18/15  Yes 01/20/15, NP  hydrOXYzine (ATARAX/VISTARIL) 50 MG tablet Take 1 tablet (50 mg total) by mouth every 6 (six) hours as needed for anxiety. 01/18/15  Yes 01/20/15, NP  pantoprazole (PROTONIX) 20 MG tablet Take 1 tablet (20 mg total) by mouth daily. 01/18/15  Yes 01/20/15, NP  QUEtiapine (SEROQUEL) 300 MG tablet Take 1 tablet (300 mg total) by mouth at bedtime. 01/18/15  Yes 01/20/15, NP  QUEtiapine (SEROQUEL) 50 MG tablet Take 3 tablets (150 mg total) by mouth every morning. 01/18/15  Yes 01/20/15, NP  sucralfate (CARAFATE) 1 GM/10ML suspension Take 10 mLs (1 g total) by mouth 4 (four) times daily -  with meals and at bedtime. 01/18/15  Yes Adonis Brook, NP  nicotine (NICODERM CQ - DOSED IN MG/24 HOURS) 21 mg/24hr patch Place 1 patch (21 mg total) onto the skin daily. 01/18/15   Adonis Brook, NP   BP 116/62 mmHg  Pulse 72  Temp(Src) 98.2 F (36.8 C) (Oral)  Resp 20  Ht 5\' 8"  (1.727 m)  Wt 137 lb (62.143 kg)  BMI 20.84 kg/m2  SpO2 97% Physical Exam  Constitutional: He appears well-developed and well-nourished. No distress.  HENT:  Head: Normocephalic  and atraumatic.  Eyes: Pupils are equal, round, and reactive to light.  Neck: Normal range of motion. Neck supple. No spinous process tenderness and no muscular tenderness present. Normal range of motion present.  No rope injuries noted to neck. No tenderness, abrasion, burns, erythema or ecchymosis. symmetrical upper grip strength.  Cardiovascular: Normal rate and regular rhythm.   Pulmonary/Chest: Effort normal.  Abdominal: Soft.  Neurological: He is alert.  Skin: Skin is warm and dry.  Psychiatric: His speech is normal. His mood appears anxious. He exhibits a depressed mood. He expresses suicidal ideation. He expresses no homicidal ideation. He expresses suicidal plans. He expresses no homicidal plans.  Nursing note and vitals reviewed.   ED Course  Procedures (including critical care time) Labs Review Labs Reviewed  COMPREHENSIVE METABOLIC PANEL - Abnormal; Notable for the following:    Chloride 99 (*)    All other components within normal limits  ACETAMINOPHEN LEVEL - Abnormal; Notable for the following:    Acetaminophen (Tylenol), Serum <10 (*)    All other components within normal limits  URINE RAPID DRUG SCREEN, HOSP PERFORMED - Abnormal; Notable for the following:    Opiates POSITIVE (*)    Cocaine POSITIVE (*)    Tetrahydrocannabinol POSITIVE (*)    All other components within normal limits  ETHANOL  SALICYLATE LEVEL  CBC    Imaging Review No results found.   EKG Interpretation None      MDM   Final diagnoses:  Suicide attempt   No abnormal physical exam findings, specifically no wounds appreciated from rope aronud neck.  ETOH, aspirin, tylenol, CMP, CBC are unremarkable. UDS: + opiates, cocaine and marijuana.  Patient medically clear TTS consulted ordered Holding orders placed Home meds reviewed and ordered accordingly.  Filed Vitals:   04/06/15 1703  BP: 116/62  Pulse: 72  Temp: 98.2 F (36.8 C)  Resp: 20        06/06/15,  PA-C 04/06/15 1819  06/06/15, DO 04/06/15 2254

## 2015-04-06 NOTE — ED Notes (Signed)
Pt changed to hospital scrubs, security notified to wand pt/belongings.

## 2015-04-06 NOTE — ED Notes (Signed)
Initial Contact - pt arrived via [employer] with IVC paperwork from [facility] with c/o chronic depression, worse x2-3 weeks with attempted hanging earlier today.  Pt reports "my brother caught me".  Pt reports similar SI attempts in the past.  Pt denies HI.  Denies AH/VH.  Poor eye contact.  Withdrawn, flat.  Calm/coop at this time.  Pt admits to cocaine x3 days ago, "pills - anything" and marijuana today.  Skin PWD.  MAEI.  Ambulatory with steady gait.  Speaking full/clear sentences, rr even/un-lab.  NAD.

## 2015-04-07 MED ORDER — QUETIAPINE FUMARATE 50 MG PO TABS
150.0000 mg | ORAL_TABLET | Freq: Every morning | ORAL | Status: DC
  Administered 2015-04-07 – 2015-04-11 (×5): 150 mg via ORAL
  Filled 2015-04-07 (×10): qty 1

## 2015-04-07 MED ORDER — TRAZODONE HCL 100 MG PO TABS
100.0000 mg | ORAL_TABLET | Freq: Every evening | ORAL | Status: DC | PRN
  Administered 2015-04-09: 100 mg via ORAL
  Filled 2015-04-07: qty 1

## 2015-04-07 NOTE — Progress Notes (Signed)
CSW attempted to speak with patient upon his request. However, once CSW returned to bedside the patient was asleep.  CSW will continue to follow up with patient.  Trish Mage [phone] ED CSW 04/07/2015 8:34 PM

## 2015-04-07 NOTE — BH Assessment (Signed)
Tele Assessment Note   Steven Barker is a 29 y.o. male who voluntarily presents to [facility] with SI/SA/Depression.  Pt reports the following: he has been SI x1 month with a plan to hang himself. P t states that he attempted to hang himself prior to his arrival to emerg dept by jumping off the deck at home but his brother intervened ans stopped from him from jumping.  Pt admits 5 previous SI attempts all by hanging and he told this Clinical research associate that his girlfriend pulled him from the closet.  Pt says his current mental state is triggered by: family problems, relational issues with his girlfriend and says his father recently tried to kill himself by cutting himself--"there was blood all over the floor".    Pt admits he uses cocaine at least 2x's a week.  He consumes 1 gram of cocaine and his last use was 4 days ago he used 1/2 gram.  He also smoke 1 marijuana blunt, daily and his last use was 3 days ago.  Pt says he's been off his medications for approx x1 month because he cannot afford his meds and no reliable transportation to purchase his medications.  Pt has upcoming court dates for legal charges(04/08/15 & 05/06/15): misdemeanor larceny, trespassing and assault on a male.  Pt denies HI/AVH.  Axis I: Major Depression, Recurrent severe and Cannabis use disorder, Severe; Cocaine use disorder, Severe Axis II: Deferred Axis III:  Past Medical History  Diagnosis Date  . Anxiety   . Depression   . Rheumatoid arthritis(714.0)    Axis IV: other psychosocial or environmental problems, problems related to legal system/crime, problems related to social environment, problems with access to health care services and problems with primary support group Axis V: 31-40 impairment in reality testing  Past Medical History:  Past Medical History  Diagnosis Date  . Anxiety   . Depression   . Rheumatoid arthritis(714.0)     History reviewed. No pertinent past surgical history.  Family History: No family history on  file.  Social History:  reports that he has been smoking Cigarettes.  He has a 5 pack-year smoking history. He does not have any smokeless tobacco history on file. He reports that he drinks alcohol. He reports that he uses illicit drugs (Marijuana and Cocaine).  Additional Social History:  Alcohol / Drug Use Pain Medications: See MAR  Prescriptions: See MAR  Over the Counter: See MAR  History of alcohol / drug use?: Yes Longest period of sobriety (when/how long): None  Negative Consequences of Use: Work / Programmer, multimedia, Copywriter, advertising relationships, Armed forces operational officer, Surveyor, quantity Withdrawal Symptoms: Other (Comment) Substance #1 Name of Substance 1: Cocaine  1 - Age of First Use: 20's  1 - Amount (size/oz): 1 Gram  1 - Frequency: 2x's Wkly  1 - Duration: On-going  1 - Last Use / Amount: 4 days Ago  Substance #2 Name of Substance 2: THC  2 - Age of First Use: Teens  2 - Amount (size/oz): 1 Blunt  2 - Frequency: Daily  2 - Duration: On-going  2 - Last Use / Amount: 3 Days Ago   CIWA: CIWA-Ar BP: 104/60 mmHg Pulse Rate: 99 COWS:    PATIENT STRENGTHS: (choose at least two) Communication skills  Allergies:  Allergies  Allergen Reactions  . Clindamycin/Lincomycin Nausea And Vomiting    Patient stated that he felt like he was going to die    Home Medications:  (Not in a hospital admission)  OB/GYN Status:  No LMP for male  patient.  General Assessment Data [facility] of Assessment: [facility] TTS Assessment: In system Is this a Tele or Face-to-Face Assessment?: Tele Assessment Is this an Initial Assessment or a Re-assessment for this encounter?: Initial Assessment Marital status: Single Maiden name: None  Is patient pregnant?: No Pregnancy Status: No Living Arrangements: Parent, Other relatives (Lives with father, grandmother and brother ) Can pt return to current living arrangement?: Yes Admission Status: Voluntary Is patient capable of signing voluntary admission?: Yes Referral Source:  MD Insurance type: SP   Medical Screening Exam [facility] Walk-in ONLY) Medical Exam completed: No Reason for MSE not completed: Other: (None )  Crisis Care Plan Living Arrangements: Parent, Other relatives (Lives with father, grandmother and brother ) Name of Psychiatrist: None  Name of Therapist: None   Education Status Is patient currently in school?: No Current Grade: None  Highest grade of school patient has completed: None  Name of school: None  Contact person: None   Risk to self with the past 6 months Suicidal Ideation: Yes-Currently Present Has patient been a risk to self within the past 6 months prior to admission? : Yes Suicidal Intent: Yes-Currently Present Has patient had any suicidal intent within the past 6 months prior to admission? : Yes Is patient at risk for suicide?: Yes Suicidal Plan?: Yes-Currently Present Has patient had any suicidal plan within the past 6 months prior to admission? : Yes Specify Current Suicidal Plan: Hanging self  Access to Means: Yes Specify Access to Suicidal Means: Ropes, Belts  What has been your use of drugs/alcohol within the last 12 months?: Abusing cocaine, thc  Previous Attempts/Gestures: Yes How many times?: 5 Other Self Harm Risks: None  Triggers for Past Attempts: Unpredictable, Family contact, Other personal contacts Intentional Self Injurious Behavior: None Family Suicide History: Yes (Father ) Recent stressful life event(s): Conflict (Comment), Legal Issues (Pls See EPIC note ) Persecutory voices/beliefs?: No Depression: Yes Depression Symptoms: Loss of interest in usual pleasures, Feeling worthless/self pity, Insomnia Substance abuse history and/or treatment for substance abuse?: Yes Suicide prevention information given to non-admitted patients: Not applicable  Risk to Others within the past 6 months Homicidal Ideation: No Does patient have any lifetime risk of violence toward others beyond the six months prior to  admission? : No Thoughts of Harm to Others: No Current Homicidal Intent: No Current Homicidal Plan: No Access to Homicidal Means: No Identified Victim: None  History of harm to others?: No Assessment of Violence: None Noted Violent Behavior Description: None  Does patient have access to weapons?: No Criminal Charges Pending?: Yes Describe Pending Criminal Charges: Misdemenor Larceny, Trespassing, Assault on a male  Does patient have a court date: Yes Court Date: 04/08/15 (05/06/15) Is patient on probation?: No  Psychosis Hallucinations: None noted Delusions: None noted  Mental Status Report Appearance/Hygiene: Disheveled, In scrubs Eye Contact: Fair Motor Activity: Unremarkable Speech: Logical/coherent, Soft, Slow Level of Consciousness: Alert Mood: Depressed Affect: Appropriate to circumstance, Depressed Anxiety Level: None Thought Processes: Coherent, Relevant Judgement: Impaired Orientation: Person, Place, Time, Situation Obsessive Compulsive Thoughts/Behaviors: None  Cognitive Functioning Concentration: Decreased Memory: Recent Intact, Remote Intact IQ: Average Insight: Poor Impulse Control: Poor Appetite: Fair Weight Loss: 0 Weight Gain: 0 Sleep: Decreased Total Hours of Sleep: 5 Vegetative Symptoms: None  ADLScreening [facility]) Patient's cognitive ability adequate to safely complete daily activities?: Yes Patient able to express need for assistance with ADLs?: Yes Independently performs ADLs?: Yes (appropriate for developmental age)  Prior Inpatient Therapy Prior Inpatient Therapy: Yes  Prior Therapy Dates: 2014,2015, 2016 Prior Therapy Facilty/Provider(s): [facility]  Reason for Treatment: SI/SA/Depression   Prior Outpatient Therapy Prior Outpatient Therapy: No Prior Therapy Dates: None  Prior Therapy Facilty/Provider(s): None  Reason for Treatment: None  Does patient have an ACCT team?: No Does patient have Intensive In-House Services?   : No Does patient have [facility] services? : No Does patient have P4CC services?: No  ADL Screening (condition at time of admission) Patient's cognitive ability adequate to safely complete daily activities?: Yes Is the patient deaf or have difficulty hearing?: No Does the patient have difficulty seeing, even when wearing glasses/contacts?: No Does the patient have difficulty concentrating, remembering, or making decisions?: Yes Patient able to express need for assistance with ADLs?: Yes Does the patient have difficulty dressing or bathing?: No Independently performs ADLs?: Yes (appropriate for developmental age) Does the patient have difficulty walking or climbing stairs?: No Weakness of Legs: None Weakness of Arms/Hands: None  Home Assistive Devices/Equipment Home Assistive Devices/Equipment: None  Therapy Consults (therapy consults require a physician order) PT Evaluation Needed: No OT Evalulation Needed: No SLP Evaluation Needed: No Abuse/Neglect Assessment (Assessment to be complete while patient is alone) Physical Abuse: Denies Verbal Abuse: Denies Sexual Abuse: Denies Exploitation of patient/patient's resources: Denies Self-Neglect: Denies Values / Beliefs Cultural Requests During Hospitalization: None Spiritual Requests During Hospitalization: None Consults Spiritual Care Consult Needed: No Social Work Consult Needed: No Merchant navy officer (For Healthcare) Does patient have an advance directive?: No Would patient like information on creating an advanced directive?: No - patient declined information Nutrition Screen- [facility] Adult/[facility] Patient's home diet: Regular Has the patient recently lost weight without trying?: No Has the patient been eating poorly because of a decreased appetite?: No Malnutrition Screening Tool Score: 0  Additional Information 1:1 In Past 12 Months?: No CIRT Risk: No Elopement Risk: No Does patient have medical clearance?: Yes      Disposition:  Disposition Initial Assessment Completed for this Encounter: Yes Disposition of Patient: Inpatient treatment program, Referred to (Per [name], PA recommends inpt admission ) Type of inpatient treatment program: Adult Patient referred to: Other (Comment) (Per [name], PA recommends inpt admission )  Murrell Redden 04/07/2015 7:00 AM

## 2015-04-07 NOTE — ED Notes (Signed)
Up on the phone 

## 2015-04-07 NOTE — BHH Counselor (Signed)
Pending review for possible placement with [facility].  

## 2015-04-07 NOTE — BH Assessment (Signed)
BHH Assessment Progress Note  The following facilities have been contacted to seek placement for this pt, with results as noted:  Beds available, information sent, decision pending:  [facility]  At capacity:  [facility]  Messages left, awaiting call back:  [place] [facility]   [place], [place] Triage Specialist [phone]

## 2015-04-07 NOTE — Progress Notes (Signed)
CM spoke with pt who confirms uninsured [employer] resident with no pcp.  Pt states he has not been able to obtain a pcp because he has had "so much going on that is stressing me out" CM discussed and provided written information for uninsured accepting pcps, discussed the importance of pcp vs EDP services for f/u care, www.needymeds.org, www.goodrx.com, discounted pharmacies and other [employer] such as [facility] , [employer], affordable care act, financial assistance, uninsured dental services, [place] med assist, DSS and  health department  Reviewed resources for [employer] uninsured accepting pcps like [facility], family medicine at [facility], [facility], [facility], local urgent care centers, [facility], [facility] family practice, general medical clinics, family services of the [place], [facility] urgent care plus others, medication resources, [facility] out patient pharmacies and housing Pt voiced understanding and appreciation of resources provided   Provided P4CC contact information Pt agreed to a referral Cm completed referral Pt to be contact by [facility] clinical liason

## 2015-04-07 NOTE — ED Notes (Signed)
C/o anxiety and requested visteril.

## 2015-04-08 DIAGNOSIS — R45851 Suicidal ideations: Secondary | ICD-10-CM

## 2015-04-08 DIAGNOSIS — F332 Major depressive disorder, recurrent severe without psychotic features: Secondary | ICD-10-CM

## 2015-04-08 NOTE — Plan of Care (Signed)
[facility] Crisis Plan  Reason for Crisis Plan:  Chronic Mental Illness/Medical Illness   Plan of Care:  Patient frequently coming to the emergency department around court date and requesting letter for court. Patient outpatient provider will need to assist with patient communication with patient lawyer.   Family Support:    Steven Barker Brother.   Current Living Environment:  Living Arrangements: Parent, Other relatives (Lives with father, grandmother and brother )  Insurance:   Hospital Account    Name Acct ID Class Status Primary Coverage   Steven, Barker [id number] Emergency Open None        Guarantor Account (for Hospital Account [id number])    Name Relation to Pt Service Area Active? Acct Type   Steven Barker Self CHSA Yes Personal/Family   Address Phone       [address] [place], [place] [address] [phone](H)          Coverage Information (for Hospital Account [id number])    Not on file      Legal Guardian:    none   Primary Care Provider:  No PCP Per Patient  Current Outpatient Providers:  Patient has been referred to [facility] and [facility] and patient has been non compliant with follow up.   Psychiatrist:  Name of Psychiatrist: None   Counselor/Therapist:  Name of Therapist: None   Compliant with Medications:  No  Additional Information:  Patient has been informed of care plan, as well as csw department.    Steven Barker A 8/12/201611:18 AM

## 2015-04-08 NOTE — ED Notes (Signed)
Pt. Noted in room. No complaints or concerns voiced. No distress or abnormal behavior noted. Will continue to monitor with security cameras. Q 15 minute rounds continue. 

## 2015-04-08 NOTE — ED Notes (Signed)
Pt. Noted sleeping in room. No complaints or concerns voiced. No distress or abnormal behavior noted. Will continue to monitor with security cameras. Q 15 minute rounds continue. 

## 2015-04-08 NOTE — ED Notes (Signed)
Report received from [facility]. Pt. Sleeping, respirations regular and unlabored.  Will continue to monitor for safety via security cameras and Q 15 minute checks.;

## 2015-04-08 NOTE — ED Notes (Signed)
Pt complains of Anxiety

## 2015-04-08 NOTE — BH Assessment (Signed)
BHH Assessment Progress Note  The following facilities have been contacted to seek placement for this pt, with results as noted:  Beds available, information sent, decision pending:  [facility]  No beds available, but accepting referrals for future consideration; information sent:  [facility]  At capacity:  [facility] [place] [facility] [place], [place] Triage Specialist [phone]

## 2015-04-08 NOTE — Consult Note (Signed)
[facility] Consult   Reason for Consult:  Suicidal ideations Referring Physician:  EDP Patient Identification: Steven Barker MRN:  [id number] Principal Diagnosis: Major depressive disorder, recurrent, severe without psychotic features Diagnosis:   Patient Active Problem List   Diagnosis Date Noted  . Major depressive disorder, recurrent, severe without psychotic features [F33.2]     Priority: High  . Substance induced mood disorder [F19.94] 11/05/2014    Priority: High  . Suicidal ideation [R45.851] 11/05/2014    Priority: High  . Polysubstance abuse [F19.10] 05/17/2013    Priority: High  . Benzodiazepine dependence [F13.20]   . Anxiety state, unspecified [F41.1] 01/01/2014  . 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 Rheumatoid arthritis [M06.9] 05/17/2013    Total Time spent with patient: 45 minutes  Subjective:   Steven Barker is a 29 y.o. male patient admitted with suicidal ideations and an attempt to hang himself.  HPI:  The patient was upset about finding his dad trying to commit suicide a few days ago and remembering the blood.  He became distraught and tried to hang himself.  Today, he reports feeling a "little bit better."  Denies homicidal ideations, hallucinations, and no withdrawal symptoms.  Steven Barker has a court date today and he wanted a letter for the courts.  He has come to the ED on many court dates requesting a letter to the courts.  This has happened so frequently that a new care plan is now in place that will not write any future letters, he will need to get them from his outpatient provider.  Steven Barker was upset as he commented he has several upcoming court dates. HPI Elements:   Location:  generalized. Quality:  acute. Severity:  severe. Timing:  constant. Duration:  couple of days. Context:   stressors.  Past Medical History:  Past Medical History  Diagnosis Date  . Anxiety   . Depression   .  Rheumatoid arthritis(714.0)    History reviewed. No pertinent past surgical history. Family History: No family history on file. Social History:  History  Alcohol Use  . Yes    Comment: occasionally     History  Drug Use  . Yes  . Special: Marijuana, Cocaine    Social History   Social History  . Marital Status: Single    Spouse Name: N/A  . Number of Children: N/A  . Years of Education: N/A   Social History Main Topics  . Smoking status: Current Every Day Smoker -- 0.50 packs/day for 10 years    Types: Cigarettes  . Smokeless tobacco: None  . Alcohol Use: Yes     Comment: occasionally  . Drug Use: Yes    Special: Marijuana, Cocaine  . Sexual Activity: Yes    Birth Control/ Protection: None   Other Topics Concern  . None   Social History Narrative   Additional Social History:    Pain Medications: See MAR  Prescriptions: See MAR  Over the Counter: See MAR  History of alcohol / drug use?: Yes Longest period of sobriety (when/how long): None  Negative Consequences of Use: Work / Youth worker, Charity fundraiser relationships, Scientist, research (physical sciences), Museum/gallery curator Withdrawal Symptoms: Other (Comment) Name of Substance 1: Cocaine  1 - Age of First Use: 20's  1 - Amount (size/oz): 1 Gram  1 - Frequency: 2x's Wkly  1 - Duration: On-going  1 - Last Use / Amount: 4 days Ago  Name of Substance 2: THC  2 - Age of First Use: Teens  2 - Amount (size/oz): 1 Blunt  2 - Frequency: Daily  2 - Duration: On-going  2 - Last Use / Amount: 3 Days Ago                  Allergies:   Allergies  Allergen Reactions  . Clindamycin/Lincomycin Nausea And Vomiting    Patient stated that he felt like he was going to die    Labs:  Results for orders placed or performed during the hospital encounter of 04/06/15 (from the past 48 hour(s))  Urine rapid drug screen (hosp performed) (Not at [facility])     Status: Abnormal   Collection Time: 04/06/15  5:13 PM  Result Value Ref Range   Opiates POSITIVE (A) NONE DETECTED    Cocaine POSITIVE (A) NONE DETECTED   Benzodiazepines NONE DETECTED NONE DETECTED   Amphetamines NONE DETECTED NONE DETECTED   Tetrahydrocannabinol POSITIVE (A) NONE DETECTED   Barbiturates NONE DETECTED NONE DETECTED    Comment:        DRUG SCREEN FOR MEDICAL PURPOSES ONLY.  IF CONFIRMATION IS NEEDED FOR ANY PURPOSE, NOTIFY LAB WITHIN 5 DAYS.        LOWEST DETECTABLE LIMITS FOR URINE DRUG SCREEN Drug Class       Cutoff (ng/mL) Amphetamine      1000 Barbiturate      200 Benzodiazepine   144 Tricyclics       818 Opiates          300 Cocaine          300 THC              50   Comprehensive metabolic panel     Status: Abnormal   Collection Time: 04/06/15  5:30 PM  Result Value Ref Range   Sodium 140 135 - 145 mmol/L   Potassium 3.7 3.5 - 5.1 mmol/L   Chloride 99 (L) 101 - 111 mmol/L   CO2 31 22 - 32 mmol/L   Glucose, Bld 99 65 - 99 mg/dL   BUN 14 6 - 20 mg/dL   Creatinine, Ser 1.07 0.61 - 1.24 mg/dL   Calcium 9.8 8.9 - 10.3 mg/dL   Total Protein 7.9 6.5 - 8.1 g/dL   Albumin 4.6 3.5 - 5.0 g/dL   AST 32 15 - 41 U/L   ALT 25 17 - 63 U/L   Alkaline Phosphatase 61 38 - 126 U/L   Total Bilirubin 0.5 0.3 - 1.2 mg/dL   GFR calc non Af Amer >60 >60 mL/min   GFR calc Af Amer >60 >60 mL/min    Comment: (NOTE) The eGFR has been calculated using the CKD EPI equation. This calculation has not been validated in all clinical situations. eGFR's persistently <60 mL/min signify possible Chronic Kidney Disease.    Anion gap 10 5 - 15  CBC     Status: None   Collection Time: 04/06/15  5:30 PM  Result Value Ref Range   WBC 9.5 4.0 - 10.5 K/uL   RBC 5.19 4.22 - 5.81 MIL/uL   Hemoglobin 16.1 13.0 - 17.0 g/dL   HCT 47.9 39.0 - 52.0 %   MCV 92.3 78.0 - 100.0 fL   MCH 31.0 26.0 - 34.0 pg   MCHC 33.6 30.0 - 36.0 g/dL   RDW 13.4 11.5 - 15.5 %   Platelets 232 150 - 400 K/uL  Ethanol (ETOH)     Status: None   Collection Time: 04/06/15  5:31 PM  Result Value Ref Range  Alcohol, Ethyl  (B) <5 <5 mg/dL    Comment:        LOWEST DETECTABLE LIMIT FOR SERUM ALCOHOL IS 5 mg/dL FOR MEDICAL PURPOSES ONLY   Salicylate level     Status: None   Collection Time: 04/06/15  5:31 PM  Result Value Ref Range   Salicylate Lvl <5.9 2.8 - 30.0 mg/dL  Acetaminophen level     Status: Abnormal   Collection Time: 04/06/15  5:31 PM  Result Value Ref Range   Acetaminophen (Tylenol), Serum <10 (L) 10 - 30 ug/mL    Comment:        THERAPEUTIC CONCENTRATIONS VARY SIGNIFICANTLY. A RANGE OF 10-30 ug/mL MAY BE AN EFFECTIVE CONCENTRATION FOR MANY PATIENTS. HOWEVER, SOME ARE BEST TREATED AT CONCENTRATIONS OUTSIDE THIS RANGE. ACETAMINOPHEN CONCENTRATIONS >150 ug/mL AT 4 HOURS AFTER INGESTION AND >50 ug/mL AT 12 HOURS AFTER INGESTION ARE OFTEN ASSOCIATED WITH TOXIC REACTIONS.     Vitals: Blood pressure 105/67, pulse 64, temperature 98.1 F (36.7 C), temperature source Oral, resp. rate 16, height _0  (1.727 m), weight 62.143 kg (137 lb), SpO2 98 %.  Risk to Self: Suicidal Ideation: Yes-Currently Present Suicidal Intent: Yes-Currently Present Is patient at risk for suicide?: Yes Suicidal Plan?: Yes-Currently Present Specify Current Suicidal Plan: Hanging self  Access to Means: Yes Specify Access to Suicidal Means: Ropes, Belts  What has been your use of drugs/alcohol within the last 12 months?: Abusing cocaine, thc  How many times?: 5 Other Self Harm Risks: None  Triggers for Past Attempts: Unpredictable, Family contact, Other personal contacts Intentional Self Injurious Behavior: None Risk to Others: Homicidal Ideation: No Thoughts of Harm to Others: No Current Homicidal Intent: No Current Homicidal Plan: No Access to Homicidal Means: No Identified Victim: None  History of harm to others?: No Assessment of Violence: None Noted Violent Behavior Description: None  Does patient have access to weapons?: No Criminal Charges Pending?: Yes Describe Pending Criminal Charges:  Misdemenor Larceny, Trespassing, Assault on a male  Does patient have a court date: Yes Court Date: 04/08/15 (05/06/15) Prior Inpatient Therapy: Prior Inpatient Therapy: Yes Prior Therapy Dates: 2014,2015, 2016 Prior Therapy Facilty/Provider(s): [facility]  Reason for Treatment: SI/SA/Depression  Prior Outpatient Therapy: Prior Outpatient Therapy: No Prior Therapy Dates: None  Prior Therapy Facilty/Provider(s): None  Reason for Treatment: None  Does patient have an ACCT team?: No Does patient have Intensive In-House Services?  : No Does patient have [facility] services? : No Does patient have P4CC services?: No  Current Facility-Administered Medications  Medication Dose Route Frequency Provider Last Rate Last Dose  . alum & mag hydroxide-simeth (MAALOX/MYLANTA) 200-200-20 MG/5ML suspension 30 mL  30 mL Oral PRN [name], PA-C      . citalopram (CELEXA) tablet 40 mg  40 mg Oral Daily [name], PA-C   40 mg at 04/08/15 0915  . gabapentin (NEURONTIN) capsule 400 mg  400 mg Oral QID [name], PA-C   400 mg at 04/08/15 1421  . hydrOXYzine (ATARAX/VISTARIL) tablet 50 mg  50 mg Oral Q6H PRN [name], PA-C   50 mg at 04/08/15 1436  . ibuprofen (ADVIL,MOTRIN) tablet 600 mg  600 mg Oral Q8H PRN [name], PA-C   600 mg at 04/08/15 0915  . nicotine (NICODERM CQ - dosed in mg/24 hours) patch 21 mg  21 mg Transdermal Daily [name], PA-C   21 mg at 04/08/15 5638  . ondansetron (ZOFRAN) tablet 4 mg  4 mg Oral Q8H PRN [name], PA-C      .  pantoprazole (PROTONIX) EC tablet 20 mg  20 mg Oral Daily [name], PA-C   20 mg at 04/08/15 0915  . QUEtiapine (SEROQUEL) tablet 150 mg  150 mg Oral q morning - 10a Forde Dandy, MD   150 mg at 04/08/15 0914  . QUEtiapine (SEROQUEL) tablet 300 mg  300 mg Oral QHS [name], PA-C   300 mg at 04/07/15 2140  . sucralfate (CARAFATE) 1 GM/10ML suspension 1 g  1 g Oral TID WC & HS [name], PA-C   1 g at 04/08/15 1256  .  traZODone (DESYREL) tablet 100 mg  100 mg Oral QHS PRN Keijuan Schellhase       Current Outpatient Prescriptions  Medication Sig Dispense Refill  . citalopram (CELEXA) 40 MG tablet Take 1 tablet (40 mg total) by mouth daily. 30 tablet 0  . gabapentin (NEURONTIN) 400 MG capsule Take 1 capsule (400 mg total) by mouth 4 (four) times daily. 120 capsule 0  . hydrOXYzine (ATARAX/VISTARIL) 50 MG tablet Take 1 tablet (50 mg total) by mouth every 6 (six) hours as needed for anxiety. 30 tablet 0  . pantoprazole (PROTONIX) 20 MG tablet Take 1 tablet (20 mg total) by mouth daily. 30 tablet 0  . QUEtiapine (SEROQUEL) 300 MG tablet Take 1 tablet (300 mg total) by mouth at bedtime. 30 tablet 0  . QUEtiapine (SEROQUEL) 50 MG tablet Take 3 tablets (150 mg total) by mouth every morning. 90 tablet 0  . sucralfate (CARAFATE) 1 GM/10ML suspension Take 10 mLs (1 g total) by mouth 4 (four) times daily -  with meals and at bedtime. 420 mL 0  . nicotine (NICODERM CQ - DOSED IN MG/24 HOURS) 21 mg/24hr patch Place 1 patch (21 mg total) onto the skin daily. 28 patch 0    Musculoskeletal: Strength & Muscle Tone: within normal limits Gait & Station: normal Patient leans: N/A  Psychiatric Specialty Exam: Physical Exam  Review of Systems  Constitutional: Negative.   HENT: Negative.   Eyes: Negative.   Respiratory: Negative.   Cardiovascular: Negative.   Gastrointestinal: Negative.   Genitourinary: Negative.   Musculoskeletal: Negative.   Skin: Negative.   Neurological: Negative.   Endo/Heme/Allergies: Negative.   Psychiatric/Behavioral: Positive for depression, suicidal ideas and substance abuse.    Blood pressure 105/67, pulse 64, temperature 98.1 F (36.7 C), temperature source Oral, resp. rate 16, height _0  (1.727 m), weight 62.143 kg (137 lb), SpO2 98 %.Body mass index is 20.84 kg/(m^2).  General Appearance: Disheveled  Eye Sport and exercise psychologist::  Fair  Speech:  Normal Rate  Volume:  Normal  Mood:  Depressed   Affect:  Blunt  Thought Process:  Coherent  Orientation:  Full (Time, Place, and Person)  Thought Content:  Rumination  Suicidal Thoughts:  Yes.  with intent/plan  Homicidal Thoughts:  No  Memory:  Immediate;   Fair Recent;   Fair Remote;   Fair  Judgement:  Poor  Insight:  Fair  Psychomotor Activity:  Decreased  Concentration:  Fair  Recall:  [employer] of Knowledge:Fair  Language: Fair  Akathisia:  No  Handed:  Right  AIMS (if indicated):     Assets:  Leisure Time Physical Health Resilience Social Support  ADL's:  Intact  Cognition: WNL  Sleep:      Medical Decision Making: Review of Psycho-Social Stressors (1), Review or order clinical lab tests (1) and Review of Medication Regimen & Side Effects (2)  Treatment Plan Summary: Daily contact with patient to assess  and evaluate symptoms and progress in treatment, Medication management and Plan Major depressive disorder, recurrent, severe without psychotic features:  Restart home medications:  Celexa 40 mg daily for depression, Gabapentin 400 mg QID for stability, Seroquel 300 mg at bedtime for mood stability -Individual counseling   Polysubstance abuse:   -Substance abuse counseling -New care plan developed to address his frequent request for court excused absences   Plan:  Recommend psychiatric Inpatient admission when medically cleared. Disposition: Admit to inpatient hospitalization for stabilization  Steven Barker, PMH-NP 04/08/2015 3:23 PM Patient seen face-to-face for psychiatric evaluation, chart reviewed and case discussed with the physician extender and developed treatment plan. Reviewed the information documented and agree with the treatment plan. Corena Pilgrim, MD

## 2015-04-09 ENCOUNTER — Emergency Department (HOSPITAL_COMMUNITY): Payer: Self-pay

## 2015-04-09 DIAGNOSIS — R45851 Suicidal ideations: Secondary | ICD-10-CM | POA: Diagnosis not present

## 2015-04-09 DIAGNOSIS — F1910 Other psychoactive substance abuse, uncomplicated: Secondary | ICD-10-CM | POA: Diagnosis not present

## 2015-04-09 DIAGNOSIS — F332 Major depressive disorder, recurrent severe without psychotic features: Secondary | ICD-10-CM | POA: Diagnosis not present

## 2015-04-09 NOTE — ED Notes (Signed)
Dr. Freida Busman aware of paitent complaint of right shoulder pain. Xray of right shoulder ordered.  [place] Callas, RN.

## 2015-04-09 NOTE — ED Notes (Signed)
Pt. Noted sleeping in room. No complaints or concerns voiced. No distress or abnormal behavior noted. Will continue to monitor with security cameras. Q 15 minute rounds continue. 

## 2015-04-09 NOTE — Progress Notes (Signed)
Disposition CSW refaxed referrals to the following inpatient psych facilities:  [facility] [place]  CSW will continue to assist as needed with placement.  [facility] Disposition CSW [phone]

## 2015-04-09 NOTE — ED Notes (Signed)
Back from x ray, nad

## 2015-04-09 NOTE — ED Notes (Signed)
To xray ambulatory w/ mHt

## 2015-04-09 NOTE — ED Notes (Signed)
Pt. Noted in hall. Pt. C/o insomnia. No distress or abnormal behavior noted. Will continue to monitor with security cameras. Q 15 minute rounds continue.

## 2015-04-09 NOTE — ED Notes (Signed)
Pt. C/o increased anxiety after phone call with his father.

## 2015-04-09 NOTE — ED Notes (Signed)
Pt. Noted in room. No complaints or concerns voiced. No distress or abnormal behavior noted. Will continue to monitor with security cameras. Q 15 minute rounds continue. 

## 2015-04-09 NOTE — ED Notes (Signed)
Report received from Janie Rambo RN. Pt. Alert and oriented in no distress denies SI, HI, AVH and pain.  Pt. Instructed to come to me with problems or concerns.Will continue to monitor for safety via security cameras and Q 15 minute checks. 

## 2015-04-09 NOTE — Consult Note (Signed)
[facility] Face-to-Face Psychiatry Consult   Reason for Consult:  Suicidal ideations Referring Physician:  EDP Patient Identification: Steven Barker MRN:  [id number] Principal Diagnosis: Major depressive disorder, recurrent, severe without psychotic features Diagnosis:   Patient Active Problem List   Diagnosis Date Noted  . Benzodiazepine dependence [F13.20]   . Major depressive disorder, recurrent, severe without psychotic features [F33.2]   . Substance induced mood disorder [F19.94] 11/05/2014  . Suicidal ideation [R45.851] 11/05/2014  . Anxiety state, unspecified [F41.1] 01/01/2014  . 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 Rheumatoid arthritis [M06.9] 05/17/2013  . Polysubstance abuse [F19.10] 05/17/2013    Total Time spent with patient: 20 min  Subjective:   Jezreel Justiniano is a 29 y.o. male patient admitted with suicidal ideations and an attempt to hang himself.  HPI:  The patient was upset about finding his dad trying to commit suicide a few days ago and remembering the blood.  He became distraught and tried to hang himself.  Today, he reports feeling a "little bit better."  Denies homicidal ideations, hallucinations, and no withdrawal symptoms.  Aarit has a court date today and he wanted a letter for the courts.  He has come to the ED on many court dates requesting a letter to the courts.  This has happened so frequently that a new care plan is now in place that will not write any future letters, he will need to get them from his outpatient provider.  Izea was upset as he commented he has several upcoming court dates.  Patient seen today on 04/09/2015. He states that he still feels depressed particular about his father's situation. He minutes that he's been abusing cocaine but hadn't used for several months until recently and claims he took one Vicodin as well as smoking marijuana. He states he feels better today but yet still feels hopeless  and has a plan for suicide by hanging himself if he was allowed to go home. HPI Elements:   Location:  generalized. Quality:  acute. Severity:  severe. Timing:  constant. Duration:  couple of days. Context:   stressors.  Past Medical History:  Past Medical History  Diagnosis Date  . Anxiety   . Depression   . Rheumatoid arthritis(714.0)    History reviewed. No pertinent past surgical history. Family History: No family history on file. Social History:  History  Alcohol Use  . Yes    Comment: occasionally     History  Drug Use  . Yes  . Special: Marijuana, Cocaine    Social History   Social History  . Marital Status: Single    Spouse Name: N/A  . Number of Children: N/A  . Years of Education: N/A   Social History Main Topics  . Smoking status: Current Every Day Smoker -- 0.50 packs/day for 10 years    Types: Cigarettes  . Smokeless tobacco: None  . Alcohol Use: Yes     Comment: occasionally  . Drug Use: Yes    Special: Marijuana, Cocaine  . Sexual Activity: Yes    Birth Control/ Protection: None   Other Topics Concern  . None   Social History Narrative   Additional Social History:    Pain Medications: See MAR  Prescriptions: See MAR  Over the Counter: See MAR  History of alcohol / drug use?: Yes Longest period of sobriety (when/how long): None  Negative Consequences of Use: Work / Programmer, multimedia, Copywriter, advertising relationships, Armed forces operational officer, Surveyor, quantity Withdrawal Symptoms: Other (Comment) Name of Substance 1:  Cocaine  1 - Age of First Use: 20's  1 - Amount (size/oz): 1 Gram  1 - Frequency: 2x's Wkly  1 - Duration: On-going  1 - Last Use / Amount: 4 days Ago  Name of Substance 2: THC  2 - Age of First Use: Teens  2 - Amount (size/oz): 1 Blunt  2 - Frequency: Daily  2 - Duration: On-going  2 - Last Use / Amount: 3 Days Ago                  Allergies:   Allergies  Allergen Reactions  . Clindamycin/Lincomycin Nausea And Vomiting    Patient stated that he felt  like he was going to die    Labs:  No results found for this or any previous visit (from the past 48 hour(s)).  Vitals: Blood pressure 95/60, pulse 55, temperature 97.6 F (36.4 C), temperature source Oral, resp. rate 16, height 5\' 8"  (1.727 m), weight 62.143 kg (137 lb), SpO2 100 %.  Risk to Self: Suicidal Ideation: Yes-Currently Present Suicidal Intent: Yes-Currently Present Is patient at risk for suicide?: Yes Suicidal Plan?: Yes-Currently Present Specify Current Suicidal Plan: Hanging self  Access to Means: Yes Specify Access to Suicidal Means: Ropes, Belts  What has been your use of drugs/alcohol within the last 12 months?: Abusing cocaine, thc  How many times?: 5 Other Self Harm Risks: None  Triggers for Past Attempts: Unpredictable, Family contact, Other personal contacts Intentional Self Injurious Behavior: None Risk to Others: Homicidal Ideation: No Thoughts of Harm to Others: No Current Homicidal Intent: No Current Homicidal Plan: No Access to Homicidal Means: No Identified Victim: None  History of harm to others?: No Assessment of Violence: None Noted Violent Behavior Description: None  Does patient have access to weapons?: No Criminal Charges Pending?: Yes Describe Pending Criminal Charges: Misdemenor Larceny, Trespassing, Assault on a male  Does patient have a court date: Yes Court Date: 04/08/15 (05/06/15) Prior Inpatient Therapy: Prior Inpatient Therapy: Yes Prior Therapy Dates: 2014,2015, 2016 Prior Therapy Facilty/Provider(s): [facility]  Reason for Treatment: SI/SA/Depression  Prior Outpatient Therapy: Prior Outpatient Therapy: No Prior Therapy Dates: None  Prior Therapy Facilty/Provider(s): None  Reason for Treatment: None  Does patient have an ACCT team?: No Does patient have Intensive In-House Services?  : No Does patient have [facility] services? : No Does patient have P4CC services?: No  Current Facility-Administered Medications  Medication Dose Route  Frequency Provider Last Rate Last Dose  . alum & mag hydroxide-simeth (MAALOX/MYLANTA) 200-200-20 MG/5ML suspension 30 mL  30 mL Oral PRN 03-05-2001, PA-C      . citalopram (CELEXA) tablet 40 mg  40 mg Oral Daily Marlon Pel, PA-C   40 mg at 04/09/15 0911  . gabapentin (NEURONTIN) capsule 400 mg  400 mg Oral QID 04/11/15, PA-C   400 mg at 04/09/15 0911  . hydrOXYzine (ATARAX/VISTARIL) tablet 50 mg  50 mg Oral Q6H PRN 04/11/15, PA-C   50 mg at 04/09/15 1115  . ibuprofen (ADVIL,MOTRIN) tablet 600 mg  600 mg Oral Q8H PRN 04/11/15, PA-C   600 mg at 04/09/15 0803  . nicotine (NICODERM CQ - dosed in mg/24 hours) patch 21 mg  21 mg Transdermal Daily 04/11/15, PA-C   21 mg at 04/09/15 1000  . ondansetron (ZOFRAN) tablet 4 mg  4 mg Oral Q8H PRN 04/11/15, PA-C      . pantoprazole (PROTONIX) EC tablet 20 mg  20 mg Oral Daily Marlon Pel,  PA-C   20 mg at 04/09/15 0955  . QUEtiapine (SEROQUEL) tablet 150 mg  150 mg Oral q morning - 10a Lavera Guise, MD   150 mg at 04/09/15 0911  . QUEtiapine (SEROQUEL) tablet 300 mg  300 mg Oral QHS Marlon Pel, PA-C   300 mg at 04/08/15 2113  . sucralfate (CARAFATE) 1 GM/10ML suspension 1 g  1 g Oral TID WC & HS Marlon Pel, PA-C   1 g at 04/09/15 0757  . traZODone (DESYREL) tablet 100 mg  100 mg Oral QHS PRN Mojeed Akintayo       Current Outpatient Prescriptions  Medication Sig Dispense Refill  . citalopram (CELEXA) 40 MG tablet Take 1 tablet (40 mg total) by mouth daily. 30 tablet 0  . gabapentin (NEURONTIN) 400 MG capsule Take 1 capsule (400 mg total) by mouth 4 (four) times daily. 120 capsule 0  . hydrOXYzine (ATARAX/VISTARIL) 50 MG tablet Take 1 tablet (50 mg total) by mouth every 6 (six) hours as needed for anxiety. 30 tablet 0  . pantoprazole (PROTONIX) 20 MG tablet Take 1 tablet (20 mg total) by mouth daily. 30 tablet 0  . QUEtiapine (SEROQUEL) 300 MG tablet Take 1 tablet (300 mg total) by mouth at bedtime. 30 tablet 0   . QUEtiapine (SEROQUEL) 50 MG tablet Take 3 tablets (150 mg total) by mouth every morning. 90 tablet 0  . sucralfate (CARAFATE) 1 GM/10ML suspension Take 10 mLs (1 g total) by mouth 4 (four) times daily -  with meals and at bedtime. 420 mL 0  . nicotine (NICODERM CQ - DOSED IN MG/24 HOURS) 21 mg/24hr patch Place 1 patch (21 mg total) onto the skin daily. 28 patch 0    Musculoskeletal: Strength & Muscle Tone: within normal limits Gait & Station: normal Patient leans: N/A  Psychiatric Specialty Exam: Physical Exam  Review of Systems  Constitutional: Negative.   HENT: Negative.   Eyes: Negative.   Respiratory: Negative.   Cardiovascular: Negative.   Gastrointestinal: Negative.   Genitourinary: Negative.   Musculoskeletal: Negative.   Skin: Negative.   Neurological: Negative.   Endo/Heme/Allergies: Negative.   Psychiatric/Behavioral: Positive for depression, suicidal ideas and substance abuse.    Blood pressure 95/60, pulse 55, temperature 97.6 F (36.4 C), temperature source Oral, resp. rate 16, height 5\' 8"  (1.727 m), weight 62.143 kg (137 lb), SpO2 100 %.Body mass index is 20.84 kg/(m^2).  General Appearance: Disheveled  Eye Solicitor::  Fair  Speech:  Normal Rate  Volume:  Normal  Mood:  Depressed  Affect:  Blunt  Thought Process:  Coherent  Orientation:  Full (Time, Place, and Person)  Thought Content:  Rumination  Suicidal Thoughts:  Yes.  with intent/plan  Homicidal Thoughts:  No  Memory:  Immediate;   Fair Recent;   Fair Remote;   Fair  Judgement:  Poor  Insight:  Fair  Psychomotor Activity:  Decreased  Concentration:  Fair  Recall:  [employer] of Knowledge:Fair  Language: Fair  Akathisia:  No  Handed:  Right  AIMS (if indicated):     Assets:  Leisure Time Physical Health Resilience Social Support  ADL's:  Intact  Cognition: WNL  Sleep:      Medical Decision Making: Review of Psycho-Social Stressors (1), Review or order clinical lab tests (1) and Review  of Medication Regimen & Side Effects (2)  Treatment Plan Summary: Daily contact with patient to assess and evaluate symptoms and progress in treatment, Medication management and Plan Major depressive  disorder, recurrent, severe without psychotic features:  continue home medications:  Celexa 40 mg daily for depression, Gabapentin 400 mg QID for stability, Seroquel 300 mg at bedtime for mood stability -Individual counseling   Polysubstance abuse:   -Substance abuse counseling -New care plan developed to address his frequent request for court excused absences   Plan:  Recommend psychiatric Inpatient admission when medically cleared. Disposition: Admit to inpatient hospitalization for stabilization Diannia Ruder MD 04/09/2015 11:53 AM

## 2015-04-09 NOTE — ED Notes (Signed)
Up to the bathroom 

## 2015-04-09 NOTE — ED Notes (Signed)
Up on the phone 

## 2015-04-10 NOTE — ED Notes (Signed)
Pt. Noted sleeping in room. No complaints or concerns voiced. No distress or abnormal behavior noted. Will continue to monitor with security cameras. Q 15 minute rounds continue. 

## 2015-04-10 NOTE — ED Notes (Signed)
Pt reports increased anxiety and is requesting visteril

## 2015-04-10 NOTE — ED Notes (Signed)
Report received from Janie Rambo RN. Pt. Alert and oriented in no distress denies SI, HI, AVH and pain.  Pt. Instructed to come to me with problems or concerns.Will continue to monitor for safety via security cameras and Q 15 minute checks. 

## 2015-04-10 NOTE — ED Notes (Signed)
Pt. Noted in hall. No complaints or concerns voiced. No distress or abnormal behavior noted. Will continue to monitor with security cameras. Q 15 minute rounds continue. 

## 2015-04-10 NOTE — ED Notes (Signed)
On the phone 

## 2015-04-10 NOTE — ED Notes (Signed)
In the bathroom to shower and change scrubs 

## 2015-04-10 NOTE — Consult Note (Signed)
[facility] Face-to-Face Psychiatry Consult   Reason for Consult:  Suicidal ideations Referring Physician:  EDP Patient Identification: Steven Barker MRN:  [id number] Principal Diagnosis: Major depressive disorder, recurrent, severe without psychotic features Diagnosis:   Patient Active Problem List   Diagnosis Date Noted  . Benzodiazepine dependence [F13.20]   . Major depressive disorder, recurrent, severe without psychotic features [F33.2]   . Substance induced mood disorder [F19.94] 11/05/2014  . Suicidal ideation [R45.851] 11/05/2014  . Anxiety state, unspecified [F41.1] 01/01/2014  . 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 Rheumatoid arthritis [M06.9] 05/17/2013  . Polysubstance abuse [F19.10] 05/17/2013    Total Time spent with patient: 20 min  Subjective:   Steven Barker is a 29 y.o. male patient admitted with suicidal ideations and an attempt to hang himself.  HPI:  The patient was upset about finding his dad trying to commit suicide a few days ago and remembering the blood.  He became distraught and tried to hang himself.  Today, he reports feeling a "little bit better."  Denies homicidal ideations, hallucinations, and no withdrawal symptoms.  Steven Barker has a court date today and he wanted a letter for the courts.  He has come to the ED on many court dates requesting a letter to the courts.  This has happened so frequently that a new care plan is now in place that will not write any future letters, he will need to get them from his outpatient provider.  Steven Barker was upset as he commented he has several upcoming court dates.  Patient seen today on 04/10/2015. He states that he still feels depressed particular about his father's situation. He minutes that he's been abusing cocaine but hadn't used for several months until recently and claims he took one Vicodin as well as smoking marijuana. He states he spoke to his father yesterday and they had an  argument over the phone. His father's house flies in another psychiatric facility. The patient asked to go to that facility and we explained that this would not be possible. He claims that his mood is still depressed and that he is still suicidal. HPI Elements:   Location:  generalized. Quality:  acute. Severity:  severe. Timing:  constant. Duration:  couple of days. Context:   stressors.  Past Medical History:  Past Medical History  Diagnosis Date  . Anxiety   . Depression   . Rheumatoid arthritis(714.0)    History reviewed. No pertinent past surgical history. Family History: No family history on file. Social History:  History  Alcohol Use  . Yes    Comment: occasionally     History  Drug Use  . Yes  . Special: Marijuana, Cocaine    Social History   Social History  . Marital Status: Single    Spouse Name: N/A  . Number of Children: N/A  . Years of Education: N/A   Social History Main Topics  . Smoking status: Current Every Day Smoker -- 0.50 packs/day for 10 years    Types: Cigarettes  . Smokeless tobacco: None  . Alcohol Use: Yes     Comment: occasionally  . Drug Use: Yes    Special: Marijuana, Cocaine  . Sexual Activity: Yes    Birth Control/ Protection: None   Other Topics Concern  . None   Social History Narrative   Additional Social History:    Pain Medications: See MAR  Prescriptions: See MAR  Over the Counter: See MAR  History of alcohol / drug  use?: Yes Longest period of sobriety (when/how long): None  Negative Consequences of Use: Work / Programmer, multimedia, Copywriter, advertising relationships, Armed forces operational officer, Surveyor, quantity Withdrawal Symptoms: Other (Comment) Name of Substance 1: Cocaine  1 - Age of First Use: 20's  1 - Amount (size/oz): 1 Gram  1 - Frequency: 2x's Wkly  1 - Duration: On-going  1 - Last Use / Amount: 4 days Ago  Name of Substance 2: THC  2 - Age of First Use: Teens  2 - Amount (size/oz): 1 Blunt  2 - Frequency: Daily  2 - Duration: On-going  2 - Last Use /  Amount: 3 Days Ago                  Allergies:   Allergies  Allergen Reactions  . Clindamycin/Lincomycin Nausea And Vomiting    Patient stated that he felt like he was going to die    Labs:  No results found for this or any previous visit (from the past 48 hour(s)).  Vitals: Blood pressure 111/81, pulse 96, temperature 98.4 F (36.9 C), temperature source Oral, resp. rate 16, height  (1.727 m), weight 62.143 kg (137 lb), SpO2 98 %.  Risk to Self: Suicidal Ideation: Yes-Currently Present Suicidal Intent: Yes-Currently Present Is patient at risk for suicide?: Yes Suicidal Plan?: Yes-Currently Present Specify Current Suicidal Plan: Hanging self  Access to Means: Yes Specify Access to Suicidal Means: Ropes, Belts  What has been your use of drugs/alcohol within the last 12 months?: Abusing cocaine, thc  How many times?: 5 Other Self Harm Risks: None  Triggers for Past Attempts: Unpredictable, Family contact, Other personal contacts Intentional Self Injurious Behavior: None Risk to Others: Homicidal Ideation: No Thoughts of Harm to Others: No Current Homicidal Intent: No Current Homicidal Plan: No Access to Homicidal Means: No Identified Victim: None  History of harm to others?: No Assessment of Violence: None Noted Violent Behavior Description: None  Does patient have access to weapons?: No Criminal Charges Pending?: Yes Describe Pending Criminal Charges: Misdemenor Larceny, Trespassing, Assault on a male  Does patient have a court date: Yes Court Date: 04/08/15 (05/06/15) Prior Inpatient Therapy: Prior Inpatient Therapy: Yes Prior Therapy Dates: 2014,2015, 2016 Prior Therapy Facilty/Provider(s): [facility]  Reason for Treatment: SI/SA/Depression  Prior Outpatient Therapy: Prior Outpatient Therapy: No Prior Therapy Dates: None  Prior Therapy Facilty/Provider(s): None  Reason for Treatment: None  Does patient have an ACCT team?: No Does patient have Intensive  In-House Services?  : No Does patient have [facility] services? : No Does patient have P4CC services?: No  Current Facility-Administered Medications  Medication Dose Route Frequency Provider Last Rate Last Dose  . alum & mag hydroxide-simeth (MAALOX/MYLANTA) 200-200-20 MG/5ML suspension 30 mL  30 mL Oral PRN Marlon Pel, PA-C      . citalopram (CELEXA) tablet 40 mg  40 mg Oral Daily Marlon Pel, PA-C   40 mg at 04/10/15 1006  . gabapentin (NEURONTIN) capsule 400 mg  400 mg Oral QID Marlon Pel, PA-C   400 mg at 04/10/15 1005  . hydrOXYzine (ATARAX/VISTARIL) tablet 50 mg  50 mg Oral Q6H PRN Marlon Pel, PA-C   50 mg at 04/09/15 1932  . ibuprofen (ADVIL,MOTRIN) tablet 600 mg  600 mg Oral Q8H PRN Marlon Pel, PA-C   600 mg at 04/09/15 0803  . nicotine (NICODERM CQ - dosed in mg/24 hours) patch 21 mg  21 mg Transdermal Daily Marlon Pel, PA-C   21 mg at 04/10/15 1005  . ondansetron (ZOFRAN) tablet 4  mg  4 mg Oral Q8H PRN Marlon Pel, PA-C      . pantoprazole (PROTONIX) EC tablet 20 mg  20 mg Oral Daily Marlon Pel, PA-C   20 mg at 04/10/15 1006  . QUEtiapine (SEROQUEL) tablet 150 mg  150 mg Oral q morning - 10a Lavera Guise, MD   150 mg at 04/10/15 1005  . QUEtiapine (SEROQUEL) tablet 300 mg  300 mg Oral QHS Marlon Pel, PA-C   300 mg at 04/09/15 2104  . sucralfate (CARAFATE) 1 GM/10ML suspension 1 g  1 g Oral TID WC & HS Marlon Pel, PA-C   1 g at 04/10/15 0819  . traZODone (DESYREL) tablet 100 mg  100 mg Oral QHS PRN Mojeed Akintayo   100 mg at 04/09/15 2118   Current Outpatient Prescriptions  Medication Sig Dispense Refill  . citalopram (CELEXA) 40 MG tablet Take 1 tablet (40 mg total) by mouth daily. 30 tablet 0  . gabapentin (NEURONTIN) 400 MG capsule Take 1 capsule (400 mg total) by mouth 4 (four) times daily. 120 capsule 0  . hydrOXYzine (ATARAX/VISTARIL) 50 MG tablet Take 1 tablet (50 mg total) by mouth every 6 (six) hours as needed for anxiety. 30 tablet 0  .  pantoprazole (PROTONIX) 20 MG tablet Take 1 tablet (20 mg total) by mouth daily. 30 tablet 0  . QUEtiapine (SEROQUEL) 300 MG tablet Take 1 tablet (300 mg total) by mouth at bedtime. 30 tablet 0  . QUEtiapine (SEROQUEL) 50 MG tablet Take 3 tablets (150 mg total) by mouth every morning. 90 tablet 0  . sucralfate (CARAFATE) 1 GM/10ML suspension Take 10 mLs (1 g total) by mouth 4 (four) times daily -  with meals and at bedtime. 420 mL 0  . nicotine (NICODERM CQ - DOSED IN MG/24 HOURS) 21 mg/24hr patch Place 1 patch (21 mg total) onto the skin daily. 28 patch 0    Musculoskeletal: Strength & Muscle Tone: within normal limits Gait & Station: normal Patient leans: N/A  Psychiatric Specialty Exam: Physical Exam  Review of Systems  Constitutional: Negative.   HENT: Negative.   Eyes: Negative.   Respiratory: Negative.   Cardiovascular: Negative.   Gastrointestinal: Negative.   Genitourinary: Negative.   Musculoskeletal: Negative.   Skin: Negative.   Neurological: Negative.   Endo/Heme/Allergies: Negative.   Psychiatric/Behavioral: Positive for depression, suicidal ideas and substance abuse.    Blood pressure 111/81, pulse 96, temperature 98.4 F (36.9 C), temperature source Oral, resp. rate 16, height 5\' 8"  (1.727 m), weight 62.143 kg (137 lb), SpO2 98 %.Body mass index is 20.84 kg/(m^2).  General Appearance: Disheveled  Eye ::  Fair  Speech:  Normal Rate  Volume:  Normal  Mood:  Depressed  Affect:  Blunt  Thought Process:  Coherent  Orientation:  Full (Time, Place, and Person)  Thought Content:  Rumination  Suicidal Thoughts:  Yes.  with intent/plan  Homicidal Thoughts:  No  Memory:  Immediate;   Fair Recent;   Fair Remote;   Fair  Judgement:  Poor  Insight:  Fair  Psychomotor Activity:  Decreased  Concentration:  Fair  Recall:  [id number] of Knowledge:Fair  Language: Fair  Akathisia:  No  Handed:  Right  AIMS (if indicated):     Assets:  Leisure Time Physical  Health Resilience Social Support  ADL's:  Intact  Cognition: WNL  Sleep:      Medical Decision Making: Review of Psycho-Social Stressors (1), Review or order clinical lab tests (1)  and Review of Medication Regimen & Side Effects (2)  Treatment Plan Summary: Daily contact with patient to assess and evaluate symptoms and progress in treatment, Medication management and Plan Major depressive disorder, recurrent, severe without psychotic features:  continue home medications:  Celexa 40 mg daily for depression, Gabapentin 400 mg QID for stability, Seroquel 300 mg at bedtime for mood stability -Individual counseling   Polysubstance abuse:   -Substance abuse counseling -New care plan developed to address his frequent request for court excused absences   Plan:  Recommend psychiatric Inpatient admission when medically cleared. Disposition: Admit to inpatient hospitalization for stabilization when bed can be found Diannia Ruder MD 04/10/2015 11:54 AM

## 2015-04-10 NOTE — ED Notes (Signed)
Pt. Noted in room. Pt. C/o some anxiety. No distress or abnormal behavior noted. Will continue to monitor with security cameras. Q 15 minute rounds continue. 

## 2015-04-11 DIAGNOSIS — F1910 Other psychoactive substance abuse, uncomplicated: Secondary | ICD-10-CM

## 2015-04-11 NOTE — ED Notes (Signed)
Pt. Noted sleeping in room. No complaints or concerns voiced. No distress or abnormal behavior noted. Will continue to monitor with security cameras. Q 15 minute rounds continue. 

## 2015-04-11 NOTE — BH Assessment (Signed)
[facility] Assessment Progress Note  Per Thedore Mins, MD, this pt does not require psychiatric hospitalization at this time.  Pt is to be released from [facility] and discharged from [facility] with outpatient referrals.  IVC has been rescinded.  Pt's discharge instructions include referral to [facility].  Pt's nurse, Kendal Hymen, has been notified.  Doylene Canning, MA Triage Specialist [phone]

## 2015-04-11 NOTE — Discharge Instructions (Signed)
For your ongoing mental health needs, you are advised to follow up with [facility].  New and returning patients are seen at their walk-in clinic.  Walk-in hours are Monday - Friday from 8:00 am - 3:00 pm.  Walk-in patients are seen on a first come, first served basis.  Try to arrive as early as possible for he best chance of being seen the same day: ° °     [facility] °     [address] °     [place], [place] [address] °     [phone] °

## 2015-04-11 NOTE — Consult Note (Signed)
[facility] Face-to-Face Psychiatry Consult   Reason for Consult:  Suicidal ideations Referring Physician:  EDP Patient Identification: Steven Barker MRN:  [id number] Principal Diagnosis: Major depressive disorder, recurrent, severe without psychotic features Diagnosis:   Patient Active Problem List   Diagnosis Date Noted  . Major depressive disorder, recurrent, severe without psychotic features [F33.2]     Priority: High  . Substance induced mood disorder [F19.94] 11/05/2014    Priority: High  . Suicidal ideation [R45.851] 11/05/2014    Priority: High  . Polysubstance abuse [F19.10] 05/17/2013    Priority: High  . Benzodiazepine dependence [F13.20]   . Anxiety state, unspecified [F41.1] 01/01/2014  . 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 Rheumatoid arthritis [M06.9] 05/17/2013    Total Time spent with patient: 30 minutes  Subjective:   Steven Barker is a 29 y.o. male patient has stabilized.  HPI:  The patient was initially admitted to the psych ED for suicidal ideations over his father's attempted suicide.  However, Steven Barker had a court date on Friday, 8/12 that he did not want to attend.  He requested a letter from the social worker to excuse his absence.  After the social worker discovered he had been told at the [facility] ED that he would not receive any more court excused letters, the patient was told this would be the last letter that [employer] would provide, he would need to go to his outpatient provider where he typically does not use-even though encouraged.  The patient still wanted hospitalization but none was found.  Steven Barker reported his biggest stressor today is not having any place to live as he had "a lot go down" at his father's house where he was living.  He cannot return according to [facility] but refused resources to the shelter.  Today, he denies suicidal/homicidal ideations, hallucinations.  He does not want substance abuse treatment  either.  Steven Barker has stabilized on his medications and will discharge today, following up at [facility] after discharge. HPI Elements:   Generalized, acute, mild, intermittent, few days, stressors of homelessness and legal issues.  Past Medical History:  Past Medical History  Diagnosis Date  . Anxiety   . Depression   . Rheumatoid arthritis(714.0)    History reviewed. No pertinent past surgical history. Family History: No family history on file. Social History:  History  Alcohol Use  . Yes    Comment: occasionally     History  Drug Use  . Yes  . Special: Marijuana, Cocaine    Social History   Social History  . Marital Status: Single    Spouse Name: N/A  . Number of Children: N/A  . Years of Education: N/A   Social History Main Topics  . Smoking status: Current Every Day Smoker -- 0.50 packs/day for 10 years    Types: Cigarettes  . Smokeless tobacco: None  . Alcohol Use: Yes     Comment: occasionally  . Drug Use: Yes    Special: Marijuana, Cocaine  . Sexual Activity: Yes    Birth Control/ Protection: None   Other Topics Concern  . None   Social History Narrative   Additional Social History:    Pain Medications: See MAR  Prescriptions: See MAR  Over the Counter: See MAR  History of alcohol / drug use?: Yes Longest period of sobriety (when/how long): None  Negative Consequences of Use: Work / Programmer, multimedia, Copywriter, advertising relationships, Armed forces operational officer, Surveyor, quantity Withdrawal Symptoms: Other (Comment) Name of Substance 1: Cocaine  1 -  Age of First Use: 20's  1 - Amount (size/oz): 1 Gram  1 - Frequency: 2x's Wkly  1 - Duration: On-going  1 - Last Use / Amount: 4 days Ago  Name of Substance 2: THC  2 - Age of First Use: Teens  2 - Amount (size/oz): 1 Blunt  2 - Frequency: Daily  2 - Duration: On-going  2 - Last Use / Amount: 3 Days Ago                  Allergies:   Allergies  Allergen Reactions  . Clindamycin/Lincomycin Nausea And Vomiting    Patient stated that he felt  like he was going to die    Labs:  No results found for this or any previous visit (from the past 48 hour(s)).  Vitals: Blood pressure 92/60, pulse 89, temperature 97.8 F (36.6 C), temperature source Oral, resp. rate 16, height 5\' 8"  (1.727 m), weight 62.143 kg (137 lb), SpO2 100 %.  Risk to Self: Suicidal Ideation: Yes-Currently Present Suicidal Intent: Yes-Currently Present Is patient at risk for suicide?: Yes Suicidal Plan?: Yes-Currently Present Specify Current Suicidal Plan: Hanging self  Access to Means: Yes Specify Access to Suicidal Means: Ropes, Belts  What has been your use of drugs/alcohol within the last 12 months?: Abusing cocaine, thc  How many times?: 5 Other Self Harm Risks: None  Triggers for Past Attempts: Unpredictable, Family contact, Other personal contacts Intentional Self Injurious Behavior: None Risk to Others: Homicidal Ideation: No Thoughts of Harm to Others: No Current Homicidal Intent: No Current Homicidal Plan: No Access to Homicidal Means: No Identified Victim: None  History of harm to others?: No Assessment of Violence: None Noted Violent Behavior Description: None  Does patient have access to weapons?: No Criminal Charges Pending?: Yes Describe Pending Criminal Charges: Misdemenor Larceny, Trespassing, Assault on a male  Does patient have a court date: Yes Court Date: 04/08/15 (05/06/15) Prior Inpatient Therapy: Prior Inpatient Therapy: Yes Prior Therapy Dates: 2014,2015, 2016 Prior Therapy Facilty/Provider(s): [facility]  Reason for Treatment: SI/SA/Depression  Prior Outpatient Therapy: Prior Outpatient Therapy: No Prior Therapy Dates: None  Prior Therapy Facilty/Provider(s): None  Reason for Treatment: None  Does patient have an ACCT team?: No Does patient have Intensive In-House Services?  : No Does patient have [facility] services? : No Does patient have P4CC services?: No  Current Facility-Administered Medications  Medication Dose Route  Frequency Provider Last Rate Last Dose  . alum & mag hydroxide-simeth (MAALOX/MYLANTA) 200-200-20 MG/5ML suspension 30 mL  30 mL Oral PRN 03-05-2001, PA-C      . citalopram (CELEXA) tablet 40 mg  40 mg Oral Daily Marlon Pel, PA-C   40 mg at 04/11/15 0945  . gabapentin (NEURONTIN) capsule 400 mg  400 mg Oral QID 04/13/15, PA-C   400 mg at 04/11/15 0945  . hydrOXYzine (ATARAX/VISTARIL) tablet 50 mg  50 mg Oral Q6H PRN 04/13/15, PA-C   50 mg at 04/10/15 2124  . ibuprofen (ADVIL,MOTRIN) tablet 600 mg  600 mg Oral Q8H PRN 2125, PA-C   600 mg at 04/09/15 0803  . nicotine (NICODERM CQ - dosed in mg/24 hours) patch 21 mg  21 mg Transdermal Daily 04/11/15, PA-C   21 mg at 04/10/15 1005  . ondansetron (ZOFRAN) tablet 4 mg  4 mg Oral Q8H PRN 04/12/15, PA-C      . pantoprazole (PROTONIX) EC tablet 20 mg  20 mg Oral Daily Marlon Pel, PA-C   20  mg at 04/10/15 1006  . QUEtiapine (SEROQUEL) tablet 150 mg  150 mg Oral q morning - 10a Lavera Guise, MD   150 mg at 04/11/15 0944  . QUEtiapine (SEROQUEL) tablet 300 mg  300 mg Oral QHS Marlon Pel, PA-C   300 mg at 04/10/15 2124  . sucralfate (CARAFATE) 1 GM/10ML suspension 1 g  1 g Oral TID WC & HS Marlon Pel, PA-C   1 g at 04/11/15 9191  . traZODone (DESYREL) tablet 100 mg  100 mg Oral QHS PRN Sherleen Pangborn   100 mg at 04/09/15 2118   Current Outpatient Prescriptions  Medication Sig Dispense Refill  . citalopram (CELEXA) 40 MG tablet Take 1 tablet (40 mg total) by mouth daily. 30 tablet 0  . gabapentin (NEURONTIN) 400 MG capsule Take 1 capsule (400 mg total) by mouth 4 (four) times daily. 120 capsule 0  . hydrOXYzine (ATARAX/VISTARIL) 50 MG tablet Take 1 tablet (50 mg total) by mouth every 6 (six) hours as needed for anxiety. 30 tablet 0  . pantoprazole (PROTONIX) 20 MG tablet Take 1 tablet (20 mg total) by mouth daily. 30 tablet 0  . QUEtiapine (SEROQUEL) 300 MG tablet Take 1 tablet (300 mg total) by mouth at  bedtime. 30 tablet 0  . QUEtiapine (SEROQUEL) 50 MG tablet Take 3 tablets (150 mg total) by mouth every morning. 90 tablet 0  . sucralfate (CARAFATE) 1 GM/10ML suspension Take 10 mLs (1 g total) by mouth 4 (four) times daily -  with meals and at bedtime. 420 mL 0  . nicotine (NICODERM CQ - DOSED IN MG/24 HOURS) 21 mg/24hr patch Place 1 patch (21 mg total) onto the skin daily. 28 patch 0    Musculoskeletal: Strength & Muscle Tone: within normal limits Gait & Station: normal Patient leans: N/A  Psychiatric Specialty Exam: Physical Exam  Review of Systems  Constitutional: Negative.   HENT: Negative.   Eyes: Negative.   Respiratory: Negative.   Cardiovascular: Negative.   Gastrointestinal: Negative.   Genitourinary: Negative.   Musculoskeletal: Negative.   Skin: Negative.   Neurological: Negative.   Endo/Heme/Allergies: Negative.   Psychiatric/Behavioral: Positive for depression, suicidal ideas and substance abuse.    Blood pressure 92/60, pulse 89, temperature 97.8 F (36.6 C), temperature source Oral, resp. rate 16, height 5\' 8"  (1.727 m), weight 62.143 kg (137 lb), SpO2 100 %.Body mass index is 20.84 kg/(m^2).  General Appearance: Casual  Eye Contact::  Good  Speech:  Normal Rate  Volume:  Normal  Mood:  Mild anxious  Affect:  Congruent  Thought Process:  Coherent  Orientation:  Full (Time, Place, and Person)  Thought Content:  WDL  Suicidal Thoughts:  No  Homicidal Thoughts:  No  Memory:  Immediate, good; recent, good; remote, good  Judgement:  Good  Insight:  Good  Psychomotor Activity:  Normal  Concentration:  Good  Recall:  Good  Fund of Knowledge: Good  Language: Good  Akathisia:  No  Handed:  Right  AIMS (if indicated):     Assets:  Leisure Time Physical Health Resilience Social Support  ADL's:  Intact  Cognition: WNL  Sleep:      Medical Decision Making: Review of Psycho-Social Stressors (1), Review or order clinical lab tests (1) and Review of  Medication Regimen & Side Effects (2)  Treatment Plan Summary: Daily contact with patient to assess and evaluate symptoms and progress in treatment, Medication management and Plan Major depressive disorder, recurrent, severe without psychotic features:  Continue  medications:  Celexa 40 mg daily for depression, Gabapentin 400 mg QID for stability, Seroquel 300 mg at bedtime for mood stability -Seroquel 150 mg in the am was added since admission for mood stabilization along with Vistaril 50 mg PRN for anxiety -Individual counseling   Polysubstance abuse:   -Substance abuse counseling -New care plan developed to address his frequent request for court excused absences   Disposition: Discharge home with follow-up at his outpatient providers, Nolene Ebbs, PMH-NP 04/11/2015 12:30 PM Patient seen face-to-face for psychiatric evaluation, chart reviewed and case discussed with the physician extender and developed treatment plan. Reviewed the information documented and agree with the treatment plan. Thedore Mins, MD

## 2015-04-11 NOTE — [facility] Suicide Risk Assessment (Signed)
Suicide Risk Assessment  Discharge Assessment   [facility] Discharge Suicide Risk Assessment   Demographic Factors:  Male, Adolescent or young adult and Caucasian  Total Time spent with patient: 30 minutes    Musculoskeletal: Strength & Muscle Tone: within normal limits Gait & Station: normal Patient leans: N/A  Psychiatric Specialty Exam: Physical Exam  Review of Systems  Constitutional: Negative.   HENT: Negative.   Eyes: Negative.   Respiratory: Negative.   Cardiovascular: Negative.   Gastrointestinal: Negative.   Genitourinary: Negative.   Musculoskeletal: Negative.   Skin: Negative.   Neurological: Negative.   Endo/Heme/Allergies: Negative.   Psychiatric/Behavioral: Positive for depression, suicidal ideas and substance abuse.    Blood pressure 92/60, pulse 89, temperature 97.8 F (36.6 C), temperature source Oral, resp. rate 16, height 5\' 8"  (1.727 m), weight 62.143 kg (137 lb), SpO2 100 %.Body mass index is 20.84 kg/(m^2).  General Appearance: Casual  Eye Contact::  Good  Speech:  Normal Rate  Volume:  Normal  Mood:  Mild anxious  Affect:  Congruent  Thought Process:  Coherent  Orientation:  Full (Time, Place, and Person)  Thought Content:  WDL  Suicidal Thoughts:  No  Homicidal Thoughts:  No  Memory:  Immediate, good; recent, good; remote, good  Judgement:  Good  Insight:  Good  Psychomotor Activity:  Normal  Concentration:  Good  Recall:  Good  Fund of Knowledge: Good  Language: Good  Akathisia:  No  Handed:  Right  AIMS (if indicated):     Assets:  Leisure Time Physical Health Resilience Social Support  ADL's:  Intact  Cognition: WNL  Sleep:      Has this patient used any form of tobacco in the last 30 days? (Cigarettes, Smokeless Tobacco, Cigars, and/or Pipes) Yes, A prescription for an FDA-approved tobacco cessation medication was offered at discharge and the patient refused  Mental Status Per Nursing Assessment::   On Admission:   suicidal  ideations  Current Mental Status by Physician: NA  Loss Factors: NA  Historical Factors: Impulsivity   Risk Reduction Factors:   Sense of responsibility to family, Positive social support and Positive therapeutic relationship  Continued Clinical Symptoms:  Mild anxiety  Cognitive Features That Contribute To Risk:  None    Suicide Risk:  Minimal: No identifiable suicidal ideation.  Patients presenting with no risk factors but with morbid ruminations; may be classified as minimal risk based on the severity of the depressive symptoms  Principal Problem: Major depressive disorder, recurrent, severe without psychotic features Discharge Diagnoses:  Patient Active Problem List   Diagnosis Date Noted  . Major depressive disorder, recurrent, severe without psychotic features [F33.2]     Priority: High  . Substance induced mood disorder [F19.94] 11/05/2014    Priority: High  . Suicidal ideation [R45.851] 11/05/2014    Priority: High  . Polysubstance abuse [F19.10] 05/17/2013    Priority: High  . Benzodiazepine dependence [F13.20]   . Anxiety state, unspecified [F41.1] 01/01/2014  . 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 Rheumatoid arthritis [M06.9] 05/17/2013      Plan Of Care/Follow-up recommendations:  Activity:  as tolerated Diet:  heart healthy diet  Is patient on multiple antipsychotic therapies at discharge:  No   Has Patient had three or more failed trials of antipsychotic monotherapy by history:  No  Recommended Plan for Multiple Antipsychotic Therapies: NA    Steven Barker, PMH-NP 04/11/2015, 1:43 PM

## 2015-04-11 NOTE — ED Notes (Signed)
After rounds with MD pt was irritable and stated he wants to be discharged.  Pt states that we arent doing anything for him and he just wants to go home.  Pt was counseled about ways of getting help, including homeless shelters and 12 step programs.  He denies SI and HI.

## 2015-04-24 ENCOUNTER — Emergency Department (HOSPITAL_COMMUNITY): Payer: Self-pay

## 2015-04-24 ENCOUNTER — Emergency Department (HOSPITAL_COMMUNITY)
Admission: EM | Admit: 2015-04-24 | Discharge: 2015-04-24 | Disposition: A | Discharge status: Home / Self Care | Payer: Self-pay | Attending: Emergency Medicine | Admitting: Emergency Medicine

## 2015-04-24 ENCOUNTER — Encounter (HOSPITAL_COMMUNITY): Payer: Self-pay | Admitting: Emergency Medicine

## 2015-04-24 DIAGNOSIS — R112 Nausea with vomiting, unspecified: Secondary | ICD-10-CM | POA: Insufficient documentation

## 2015-04-24 DIAGNOSIS — Z760 Encounter for issue of repeat prescription: Secondary | ICD-10-CM | POA: Insufficient documentation

## 2015-04-24 DIAGNOSIS — R1032 Left lower quadrant pain: Secondary | ICD-10-CM | POA: Insufficient documentation

## 2015-04-24 DIAGNOSIS — R509 Fever, unspecified: Secondary | ICD-10-CM | POA: Insufficient documentation

## 2015-04-24 DIAGNOSIS — Z720 Tobacco use: Secondary | ICD-10-CM | POA: Insufficient documentation

## 2015-04-24 DIAGNOSIS — F329 Major depressive disorder, single episode, unspecified: Secondary | ICD-10-CM | POA: Insufficient documentation

## 2015-04-24 DIAGNOSIS — R1013 Epigastric pain: Secondary | ICD-10-CM | POA: Insufficient documentation

## 2015-04-24 DIAGNOSIS — R197 Diarrhea, unspecified: Secondary | ICD-10-CM | POA: Insufficient documentation

## 2015-04-24 DIAGNOSIS — Z8739 Personal history of other diseases of the musculoskeletal system and connective tissue: Secondary | ICD-10-CM | POA: Insufficient documentation

## 2015-04-24 DIAGNOSIS — F419 Anxiety disorder, unspecified: Secondary | ICD-10-CM | POA: Insufficient documentation

## 2015-04-24 LAB — COMPREHENSIVE METABOLIC PANEL
ALT: 67 U/L — ABNORMAL HIGH (ref 17–63)
ANION GAP: 7 (ref 5–15)
AST: 26 U/L (ref 15–41)
Albumin: 4.8 g/dL (ref 3.5–5.0)
Alkaline Phosphatase: 71 U/L (ref 38–126)
BUN: 10 mg/dL (ref 6–20)
CALCIUM: 9.8 mg/dL (ref 8.9–10.3)
CHLORIDE: 105 mmol/L (ref 101–111)
CO2: 25 mmol/L (ref 22–32)
Creatinine, Ser: 0.91 mg/dL (ref 0.61–1.24)
Glucose, Bld: 112 mg/dL — ABNORMAL HIGH (ref 65–99)
Potassium: 5.1 mmol/L (ref 3.5–5.1)
Sodium: 137 mmol/L (ref 135–145)
Total Bilirubin: 0.6 mg/dL (ref 0.3–1.2)
Total Protein: 8.4 g/dL — ABNORMAL HIGH (ref 6.5–8.1)

## 2015-04-24 LAB — CBC WITH DIFFERENTIAL/PLATELET
Basophils Absolute: 0 10*3/uL (ref 0.0–0.1)
Basophils Relative: 0 % (ref 0–1)
EOS ABS: 0 10*3/uL (ref 0.0–0.7)
EOS PCT: 0 % (ref 0–5)
HCT: 48.7 % (ref 39.0–52.0)
Hemoglobin: 16.9 g/dL (ref 13.0–17.0)
LYMPHS ABS: 1.5 10*3/uL (ref 0.7–4.0)
Lymphocytes Relative: 12 % (ref 12–46)
MCH: 31.7 pg (ref 26.0–34.0)
MCHC: 34.7 g/dL (ref 30.0–36.0)
MCV: 91.4 fL (ref 78.0–100.0)
MONO ABS: 0.5 10*3/uL (ref 0.1–1.0)
MONOS PCT: 4 % (ref 3–12)
Neutro Abs: 10.4 10*3/uL — ABNORMAL HIGH (ref 1.7–7.7)
Neutrophils Relative %: 84 % — ABNORMAL HIGH (ref 43–77)
PLATELETS: 307 10*3/uL (ref 150–400)
RBC: 5.33 MIL/uL (ref 4.22–5.81)
RDW: 13.7 % (ref 11.5–15.5)
WBC: 12.3 10*3/uL — AB (ref 4.0–10.5)

## 2015-04-24 LAB — I-STAT CG4 LACTIC ACID, ED
LACTIC ACID, VENOUS: 1.66 mmol/L (ref 0.5–2.0)
LACTIC ACID, VENOUS: 0.58 mmol/L (ref 0.5–2.0)

## 2015-04-24 MED ORDER — PROMETHAZINE HCL 25 MG/ML IJ SOLN
12.5000 mg | Freq: Once | INTRAMUSCULAR | Status: AC
  Administered 2015-04-24: 12.5 mg via INTRAVENOUS
  Filled 2015-04-24: qty 1

## 2015-04-24 MED ORDER — NICOTINE 21 MG/24HR TD PT24: 21.0000 mg | MEDICATED_PATCH | Freq: Every day | TRANSDERMAL | Status: DC

## 2015-04-24 MED ORDER — SUCRALFATE 1 GM/10ML PO SUSP: 1.0000 g | Freq: Three times a day (TID) | ORAL | Status: DC

## 2015-04-24 MED ORDER — IOHEXOL 300 MG/ML  SOLN
100.0000 mL | Freq: Once | INTRAMUSCULAR | Status: AC | PRN
  Administered 2015-04-24: 100 mL via INTRAVENOUS

## 2015-04-24 MED ORDER — HYDROXYZINE HCL 50 MG PO TABS: 50.0000 mg | ORAL_TABLET | Freq: Four times a day (QID) | ORAL | Status: DC | PRN

## 2015-04-24 MED ORDER — QUETIAPINE FUMARATE 300 MG PO TABS: 300.0000 mg | ORAL_TABLET | Freq: Every day | ORAL | Status: AC

## 2015-04-24 MED ORDER — GABAPENTIN 400 MG PO CAPS: 400.0000 mg | ORAL_CAPSULE | Freq: Four times a day (QID) | ORAL | Status: DC

## 2015-04-24 MED ORDER — HYDROMORPHONE HCL 1 MG/ML IJ SOLN: 0.5000 mg | Freq: Once | INTRAMUSCULAR | Status: DC

## 2015-04-24 MED ORDER — PANTOPRAZOLE SODIUM 20 MG PO TBEC: 20.0000 mg | DELAYED_RELEASE_TABLET | Freq: Every day | ORAL | Status: DC

## 2015-04-24 MED ORDER — SODIUM CHLORIDE 0.9 % IV SOLN
1000.0000 mL | Freq: Once | INTRAVENOUS | Status: AC
  Administered 2015-04-24: 1000 mL via INTRAVENOUS

## 2015-04-24 MED ORDER — ONDANSETRON HCL 4 MG/2ML IJ SOLN
4.0000 mg | Freq: Once | INTRAMUSCULAR | Status: AC
  Administered 2015-04-24: 4 mg via INTRAVENOUS
  Filled 2015-04-24: qty 2

## 2015-04-24 MED ORDER — SODIUM CHLORIDE 0.9 % IV SOLN
1000.0000 mL | INTRAVENOUS | Status: DC
  Administered 2015-04-24: 1000 mL via INTRAVENOUS

## 2015-04-24 MED ORDER — PROMETHAZINE HCL 12.5 MG PO TABS: 12.5000 mg | ORAL_TABLET | Freq: Four times a day (QID) | ORAL | Status: DC | PRN

## 2015-04-24 MED ORDER — CITALOPRAM HYDROBROMIDE 40 MG PO TABS: 40.0000 mg | ORAL_TABLET | Freq: Every day | ORAL | Status: DC

## 2015-04-24 NOTE — Discharge Instructions (Signed)

## 2015-04-24 NOTE — ED Notes (Signed)
Pt ambulated without difficulty with steady gait toward the exit. Bus pass offered to pt and encouraged him to use the lobby's  phone anytime needed to call for transportation.

## 2015-04-24 NOTE — ED Notes (Signed)
Per EMS N/V since 15:00 yesterday after walking home from the store. N/V throughout the night with cold sweats, unknown if fever. Off psych meds x 3 days, unable to get prescription filled. No complaints of pain.

## 2015-04-24 NOTE — ED Provider Notes (Signed)
CSN: [id number]     Arrival date & time 04/24/15  1036 History   First MD Initiated Contact with Patient 04/24/15 1041     Chief Complaint  Patient presents with  . Emesis  . Nausea  . Medication Refill   29 y/o man presents with nausea, vomiting, sweats since yesterday around 3:00pm. Onset was acute while walking outdoors, and symptoms are persistent from that time preventing rest overnight. Emesis was mostly gastric contents initially then dry heaving by today. He tried taking up to 4 325mg  tylenol every 4 hours for his chills but threw up a portion of them and is not sure the total amount successfully taken. He was also constipated previously but then passed two loose watery stools this morning. No blood was noticed. He does also endorse moderate abdominal pain but is less concerned for pain than nausea and chills.  (Consider location/radiation/quality/duration/timing/severity/associated sxs/prior Treatment) Patient is a 29 y.o. male presenting with vomiting. The history is provided by the patient.  Emesis Severity:  Moderate Duration:  1 day Timing:  Constant Number of daily episodes:  Approximtely 10 episodes Quality:  Stomach contents Progression:  Unchanged Chronicity:  New Recent urination:  Decreased Associated symptoms: abdominal pain, chills, diarrhea and fever   Associated symptoms: no sore throat     Past Medical History  Diagnosis Date  . Anxiety   . Depression   . Rheumatoid arthritis(714.0)    History reviewed. No pertinent past surgical history. History reviewed. No pertinent family history. Social History  Substance Use Topics  . Smoking status: Current Every Day Smoker -- 0.50 packs/day for 10 years    Types: Cigarettes  . Smokeless tobacco: None  . Alcohol Use: Yes     Comment: occasionally    Review of Systems  Constitutional: Positive for fever and chills.  HENT: Negative for sore throat.   Respiratory: Negative for shortness of breath.    Cardiovascular: Negative for chest pain.  Gastrointestinal: Positive for vomiting, abdominal pain and diarrhea.  Skin: Negative for pallor and rash.  Neurological: Negative for dizziness and light-headedness.    Allergies  Clindamycin/lincomycin  Home Medications   Prior to Admission medications   Medication Sig Start Date End Date Taking? Authorizing Provider  citalopram (CELEXA) 40 MG tablet Take 1 tablet (40 mg total) by mouth daily. 04/24/15   04/26/15, MD  gabapentin (NEURONTIN) 400 MG capsule Take 1 capsule (400 mg total) by mouth 4 (four) times daily. 04/24/15   04/26/15, MD  hydrOXYzine (ATARAX/VISTARIL) 50 MG tablet Take 1 tablet (50 mg total) by mouth every 6 (six) hours as needed for anxiety. 04/24/15   04/26/15, MD  nicotine (NICODERM CQ - DOSED IN MG/24 HOURS) 21 mg/24hr patch Place 1 patch (21 mg total) onto the skin daily. 04/24/15   04/26/15, MD  pantoprazole (PROTONIX) 20 MG tablet Take 1 tablet (20 mg total) by mouth daily. 04/24/15   04/26/15, MD  promethazine (PHENERGAN) 12.5 MG tablet Take 1 tablet (12.5 mg total) by mouth every 6 (six) hours as needed for nausea or vomiting. 04/24/15   04/26/15, MD  QUEtiapine (SEROQUEL) 300 MG tablet Take 1 tablet (300 mg total) by mouth at bedtime. 04/24/15   04/26/15, MD  sucralfate (CARAFATE) 1 GM/10ML suspension Take 10 mLs (1 g total) by mouth 4 (four) times daily -  with meals and at bedtime. 04/24/15   04/26/15, MD   BP 110/66 mmHg  Pulse 54  Temp(Src) 97.8 F (36.6 C) (Oral)  Resp 18  SpO2 100% Physical Exam  Constitutional: He is oriented to person, place, and time. He appears well-developed and well-nourished.  HENT:  Oral mucosa dry, nonerythematous, poor dentition  Eyes: Conjunctivae are normal. Pupils are equal, round, and reactive to light.  Neck: Normal range of motion. Neck supple.  Cardiovascular: Normal rate, regular rhythm and normal heart  sounds.   Pulmonary/Chest: Effort normal and breath sounds normal.  Abdominal: Soft. There is tenderness.  Epigastric and LLQ TTP, hyperactive bowel sounds throughout  Musculoskeletal: He exhibits no edema.  Neurological: He is alert and oriented to person, place, and time.  Skin: No rash noted. No erythema.  Warm, damp    ED Course  Procedures (including critical care time) Labs Review Labs Reviewed  CBC WITH DIFFERENTIAL/PLATELET - Abnormal; Notable for the following:    WBC 12.3 (*)    Neutrophils Relative % 84 (*)    Neutro Abs 10.4 (*)    All other components within normal limits  COMPREHENSIVE METABOLIC PANEL - Abnormal; Notable for the following:    Glucose, Bld 112 (*)    Total Protein 8.4 (*)    ALT 67 (*)    All other components within normal limits  I-STAT CG4 LACTIC ACID, ED  I-STAT CG4 LACTIC ACID, ED    Imaging Review Ct Abdomen Pelvis W Contrast  04/24/2015   CLINICAL DATA:  Left upper quadrant pain, intermittent for the past year. Fever, chills, leukocytosis.  EXAM: CT ABDOMEN AND PELVIS WITH CONTRAST  TECHNIQUE: Multidetector CT imaging of the abdomen and pelvis was performed using the standard protocol following bolus administration of intravenous contrast.  CONTRAST:  OMNIPAQUE IOHEXOL 300 MG/ML  SOLN  COMPARISON:  04/12/2014  FINDINGS: Lung bases are clear.  No effusions.  Heart is normal size.  Liver, gallbladder, spleen, pancreas, adrenals and kidneys are normal. Stomach, large and small bowel are unremarkable. Appendix is visualized and is normal. Aorta is normal caliber. No free fluid, free air or adenopathy. No acute bony abnormality or focal bone lesion.  Urinary bladder is grossly unremarkable.  IMPRESSION: No acute findings in the abdomen or pelvis.   Electronically Signed   By: Charlett Nose M.D.   On: 04/24/2015 16:21   I have personally reviewed and evaluated these images and lab results as part of my medical decision-making.   EKG  Interpretation None      MDM   Final diagnoses:  Non-intractable vomiting with nausea, vomiting of unspecified type    29 y/o man presenting with 1 day of nausea, vomiting, fever, chills, 2 episodes watery diarrhea. Leukocytosis otherwise unremarkable laboratory studies. He was off all medications for the past 3 days. Patient has extensive history of polysubstance abuse but only admits smoking marijuana yesterday no other recent use. On examination bowel sounds were hyperactive and abdomen was mildly TTP. No bile or blood in vomit, no blood noticed in stool. Patient continued actively vomiting in ED after 4mg  IV zofran was given and IV NS. Abdominal CT with IV contrast was ordered after 2 hours with limited improvement to IV antiemetic and fluids. Result was negative. However after 12.5mg  IV phenergan his nausea became controlled and he was resting comfortably. Symptoms are likely related to mild inflammatory process requiring only supportive care. Patient was stable for discharge to home with phenergan and refill of his psychiatric medication. Case management was consulted and provided [facility] letter to patient for help with cost  of medications.  Fuller Plan, MD 04/25/15 1607  Nelva Nay, MD 05/07/15 [phone]

## 2015-04-24 NOTE — Care Management Note (Signed)
Case Management Note  Patient Details  Name: Steven Barker MRN: [id number] Date of Birth: 10/21/1985  Subjective/Objective:                    Action/Plan: Medication assistance  Expected Discharge Date:                  Expected Discharge Plan:     In-House Referral:     Discharge planning Services  CM Consult, [facility]  Post Acute Care Choice:    Choice offered to:     DME Arranged:    DME Agency:     HH Arranged:    HH Agency:     Status of Service:     Medicare Important Message Given:    Date Medicare IM Given:    Medicare IM give by:    Date Additional Medicare IM Given:    Additional Medicare Important Message give by:     If discussed at Long Length of Stay Meetings, dates discussed:    Additional Comments: CM spoke with patient at the bedside. Needs assistance with the cost of Gabapentin, Seroquel and Celexa. Match letter provided. Coupon to have next prescription for Gabapentin filled at [facility] for $4.62 provided to patient. Informed Celexa can be filled at [facility] for $4.  Antony Haste, RN 04/24/2015, 4:28 PM

## 2015-04-24 NOTE — ED Notes (Signed)
Pt made aware we need a urine sample. Pt given urinal.

## 2015-04-24 NOTE — ED Provider Notes (Signed)
Signed out by Dr Radford Pax at 1600 that d/c instructions are done, and that if/when CT neg acute, to d/c to home. Ct neg acute.  D/c to home.     Cathren Laine, MD 04/24/15 [phone]

## 2015-04-24 NOTE — ED Provider Notes (Signed)
Still awaiting abdominal CT.  Patient states his nausea is controlled at present time.  We'll discharge home with refill medications.  Patient asked and will attempt to get a social work consult because financial difficulty in filling medications until early next month.  Nelva Nay, MD 04/24/15 1535

## 2015-04-26 NOTE — Progress Notes (Signed)
CM transferred by flow manager to pt who states he was seen on 04/24/15 at [facility] ED and was not given a rx for neurontin 400 mg Cm inquired if pt had obtain a pcp as recommended He confirms he still has not obtained a pcp. Cm informed pt she would consult with EDPs to see what needs to be done.  Cm spoke with Morrie Sheldon at [address] [facility] [phone] to confirm pt has not presented a Rx for neurontin since July 2016. Morrie Sheldon reports pt did call earlier in morning to see if a Rx had been called in for him for neurontin.  CM consulted with Dr Jodi Mourning (the EDPs pt saw on 04/24/15 are not present at an ED) Found in [facility] the Rx was entered in [facility] and gave Cm permission to call in Neurontin 400 mg qid 120 tabs no refills to pt pharmacy of choice  CM spoke again with Morrie Sheldon at 1128 and called in rx 1129 Cm returned a call to pt listed home number A male states pt was not available Cm requested male inform pt to call [facility] pharmacy

## 2015-12-09 ENCOUNTER — Emergency Department (HOSPITAL_COMMUNITY)
Admission: EM | Admit: 2015-12-09 | Discharge: 2015-12-10 | Disposition: A | Discharge status: Home / Self Care | Payer: Federal, State, Local not specified - Other | Attending: Emergency Medicine | Admitting: Emergency Medicine

## 2015-12-09 ENCOUNTER — Encounter (HOSPITAL_COMMUNITY): Payer: Self-pay

## 2015-12-09 DIAGNOSIS — F1410 Cocaine abuse, uncomplicated: Secondary | ICD-10-CM | POA: Insufficient documentation

## 2015-12-09 DIAGNOSIS — F419 Anxiety disorder, unspecified: Secondary | ICD-10-CM | POA: Insufficient documentation

## 2015-12-09 DIAGNOSIS — F1110 Opioid abuse, uncomplicated: Secondary | ICD-10-CM | POA: Insufficient documentation

## 2015-12-09 DIAGNOSIS — R45851 Suicidal ideations: Secondary | ICD-10-CM

## 2015-12-09 DIAGNOSIS — F3130 Bipolar disorder, current episode depressed, mild or moderate severity, unspecified: Secondary | ICD-10-CM | POA: Insufficient documentation

## 2015-12-09 DIAGNOSIS — F4310 Post-traumatic stress disorder, unspecified: Secondary | ICD-10-CM | POA: Insufficient documentation

## 2015-12-09 DIAGNOSIS — F17210 Nicotine dependence, cigarettes, uncomplicated: Secondary | ICD-10-CM | POA: Insufficient documentation

## 2015-12-09 DIAGNOSIS — F32A Depression, unspecified: Secondary | ICD-10-CM

## 2015-12-09 DIAGNOSIS — F1310 Sedative, hypnotic or anxiolytic abuse, uncomplicated: Secondary | ICD-10-CM | POA: Insufficient documentation

## 2015-12-09 DIAGNOSIS — F329 Major depressive disorder, single episode, unspecified: Secondary | ICD-10-CM

## 2015-12-09 DIAGNOSIS — Z79899 Other long term (current) drug therapy: Secondary | ICD-10-CM | POA: Insufficient documentation

## 2015-12-09 DIAGNOSIS — F1210 Cannabis abuse, uncomplicated: Secondary | ICD-10-CM | POA: Insufficient documentation

## 2015-12-09 DIAGNOSIS — Z8739 Personal history of other diseases of the musculoskeletal system and connective tissue: Secondary | ICD-10-CM | POA: Insufficient documentation

## 2015-12-09 DIAGNOSIS — F1910 Other psychoactive substance abuse, uncomplicated: Secondary | ICD-10-CM

## 2015-12-09 DIAGNOSIS — F1994 Other psychoactive substance use, unspecified with psychoactive substance-induced mood disorder: Secondary | ICD-10-CM | POA: Diagnosis present

## 2015-12-09 HISTORY — DX: Bipolar disorder, unspecified: F31.9

## 2015-12-09 HISTORY — DX: Post-traumatic stress disorder, unspecified: F43.10

## 2015-12-09 LAB — COMPREHENSIVE METABOLIC PANEL
ALK PHOS: 62 U/L (ref 38–126)
ALT: 12 U/L — AB (ref 17–63)
ANION GAP: 8 (ref 5–15)
AST: 19 U/L (ref 15–41)
Albumin: 4.3 g/dL (ref 3.5–5.0)
BILIRUBIN TOTAL: 0.6 mg/dL (ref 0.3–1.2)
BUN: 12 mg/dL (ref 6–20)
CALCIUM: 9 mg/dL (ref 8.9–10.3)
CO2: 25 mmol/L (ref 22–32)
CREATININE: 0.86 mg/dL (ref 0.61–1.24)
Chloride: 103 mmol/L (ref 101–111)
Glucose, Bld: 100 mg/dL — ABNORMAL HIGH (ref 65–99)
Potassium: 4.4 mmol/L (ref 3.5–5.1)
SODIUM: 136 mmol/L (ref 135–145)
TOTAL PROTEIN: 7.2 g/dL (ref 6.5–8.1)

## 2015-12-09 LAB — CBC
HCT: 44.1 % (ref 39.0–52.0)
HEMOGLOBIN: 14.9 g/dL (ref 13.0–17.0)
MCH: 30.9 pg (ref 26.0–34.0)
MCHC: 33.8 g/dL (ref 30.0–36.0)
MCV: 91.5 fL (ref 78.0–100.0)
PLATELETS: 232 10*3/uL (ref 150–400)
RBC: 4.82 MIL/uL (ref 4.22–5.81)
RDW: 13.3 % (ref 11.5–15.5)
WBC: 8.5 10*3/uL (ref 4.0–10.5)

## 2015-12-09 LAB — ACETAMINOPHEN LEVEL

## 2015-12-09 LAB — RAPID URINE DRUG SCREEN, HOSP PERFORMED
Amphetamines: NOT DETECTED
BENZODIAZEPINES: POSITIVE — AB
Barbiturates: NOT DETECTED
Cocaine: POSITIVE — AB
Opiates: POSITIVE — AB
Tetrahydrocannabinol: POSITIVE — AB

## 2015-12-09 LAB — CBG MONITORING, ED
GLUCOSE-CAPILLARY: 96 mg/dL (ref 65–99)
Glucose-Capillary: 85 mg/dL (ref 65–99)

## 2015-12-09 LAB — ETHANOL: Alcohol, Ethyl (B): <5 mg/dL (ref <5)

## 2015-12-09 LAB — SALICYLATE LEVEL

## 2015-12-09 MED ORDER — SODIUM CHLORIDE 0.9 % IV BOLUS (SEPSIS)
1000.0000 mL | Freq: Once | INTRAVENOUS | Status: AC
  Administered 2015-12-09: 1000 mL via INTRAVENOUS

## 2015-12-09 MED ORDER — GABAPENTIN 400 MG PO CAPS
400.0000 mg | ORAL_CAPSULE | Freq: Four times a day (QID) | ORAL | Status: DC
  Administered 2015-12-09 – 2015-12-10 (×4): 400 mg via ORAL
  Filled 2015-12-09 (×4): qty 1

## 2015-12-09 MED ORDER — SUCRALFATE 1 GM/10ML PO SUSP
1.0000 g | Freq: Three times a day (TID) | ORAL | Status: DC
  Administered 2015-12-10 (×3): 1 g via ORAL
  Filled 2015-12-09 (×7): qty 10

## 2015-12-09 MED ORDER — QUETIAPINE FUMARATE 300 MG PO TABS
300.0000 mg | ORAL_TABLET | Freq: Every day | ORAL | Status: DC
  Administered 2015-12-10: 300 mg via ORAL
  Filled 2015-12-09: qty 1

## 2015-12-09 MED ORDER — ASPIRIN EC 325 MG PO TBEC
650.0000 mg | DELAYED_RELEASE_TABLET | Freq: Every day | ORAL | Status: DC | PRN
  Filled 2015-12-09: qty 2

## 2015-12-09 MED ORDER — CITALOPRAM HYDROBROMIDE 40 MG PO TABS
40.0000 mg | ORAL_TABLET | Freq: Every day | ORAL | Status: DC
  Administered 2015-12-09 – 2015-12-10 (×2): 40 mg via ORAL
  Filled 2015-12-09 (×3): qty 1

## 2015-12-09 MED ORDER — PROMETHAZINE HCL 25 MG PO TABS
12.5000 mg | ORAL_TABLET | Freq: Four times a day (QID) | ORAL | Status: DC | PRN
Start: 2015-12-09 — End: 2015-12-10

## 2015-12-09 MED ORDER — NICOTINE 21 MG/24HR TD PT24
21.0000 mg | MEDICATED_PATCH | Freq: Every day | TRANSDERMAL | Status: DC
  Administered 2015-12-10: 21 mg via TRANSDERMAL
  Filled 2015-12-09: qty 1

## 2015-12-09 MED ORDER — HYDROXYZINE HCL 25 MG PO TABS
50.0000 mg | ORAL_TABLET | Freq: Four times a day (QID) | ORAL | Status: DC | PRN
  Administered 2015-12-10 (×2): 50 mg via ORAL
  Filled 2015-12-09 (×2): qty 2

## 2015-12-09 MED ORDER — PANTOPRAZOLE SODIUM 20 MG PO TBEC
20.0000 mg | DELAYED_RELEASE_TABLET | Freq: Every day | ORAL | Status: DC
  Administered 2015-12-10: 20 mg via ORAL
  Filled 2015-12-09 (×4): qty 1

## 2015-12-09 MED ORDER — QUETIAPINE FUMARATE 50 MG PO TABS
150.0000 mg | ORAL_TABLET | Freq: Every morning | ORAL | Status: DC
  Administered 2015-12-09 – 2015-12-10 (×2): 150 mg via ORAL
  Filled 2015-12-09 (×2): qty 1

## 2015-12-09 NOTE — ED Notes (Signed)
Pt changed into scrubs.  Pt and belongings wanded by security.  

## 2015-12-09 NOTE — ED Notes (Signed)
Pt awake, alert & responsive to verbal stimuli.  No distress noted,calm & cooperative at present. Pt is labile.  Monitoring for safety, Q 15 min checks in effect.

## 2015-12-09 NOTE — BHH Counselor (Signed)
BHH Assessment Progress Note  Writer attempted to see pt via tele-assessment machine. Per RN, Graciella Belton, pt refused assessment, while keeping covers over his head. Consulted issue with Dahlia Byes, NP and it was determined that another attempt would be made for assessment face to face and, if he continued to refuse, disposition would be determined at that time.   Johny Shock. Ladona Ridgel, MS, NCC, LPCA Counselor

## 2015-12-09 NOTE — ED Notes (Signed)
Bed: BS96 Expected date:  Expected time:  Means of arrival:  Comments: RM 39

## 2015-12-09 NOTE — BH Assessment (Signed)
This writer attempted to assess patient for the second time. Patient refused to answer questions and states "come back later." Patient was informed that he refused the assessment earlier and was encouraged to participate and patient states "is it the same questions?" and then refused to participate in the assessment.  Dahlia Byes, PMH-NP informed of patients refusal.   Davina Poke, LCSW Therapeutic Triage Specialist [facility] 12/09/2015 3:45 PM

## 2015-12-09 NOTE — ED Notes (Signed)
Bed: ZCH88 Expected date:  Expected time:  Means of arrival:  Comments: Tri 3

## 2015-12-09 NOTE — ED Notes (Signed)
Dr Anitra Lauth notified of low B/P, pt awake. Alert & responsive,, not taking po fluids.  Charge Nurse Tiffany notified of MD's order for pt to get IV fluids.

## 2015-12-09 NOTE — ED Notes (Signed)
Pt refused the Gator-Aide offered to him and the dinner offered to him. He became irritable and asked to be left alone so that he can sleep. CBG = 97

## 2015-12-09 NOTE — ED Notes (Signed)
Checked pt's VS and BP was exceedingly low. Reported to EDP, Dr. Anitra Lauth, who said that she would come over and look at him.

## 2015-12-09 NOTE — ED Notes (Addendum)
Pt given multiple drinks and packages of graham crackers.  Now demanding a sandwich.  Pt given a ham sandwich.

## 2015-12-09 NOTE — Progress Notes (Signed)
CM spoke with pt who confirms uninsured [place] resident with no pcp.  CM discussed and provided written information for uninsured accepting pcps, discussed the importance of pcp vs EDP services for f/u care, www.needymeds.org, www.goodrx.com, discounted pharmacies and other [place] resources such as [facility] , [facility], affordable care act, financial assistance, uninsured dental services,  med assist, DSS and  health department  Reviewed resources for [place] uninsured accepting pcps like [facility], [facility] [address], [facility], [facility], local urgent care centers, [facility], [facility], general medical clinics, [facility], [facility] plus others, medication resources, [facility] out patient pharmacies and housing Pt voiced understanding and appreciation of resources provided   

## 2015-12-09 NOTE — ED Provider Notes (Addendum)
I was called over to evaluate the patient due to a low blood pressure read. Patient is awake and alert and responds appropriately. He has not received any clonidine for heroin withdrawal. However he did receive gabapentin and Seroquel recently and he is only 62 kg. His blood pressure typically is in the low 100s. We will check a blood sugar and encouraged the nurse to have patient drink some fluids. Recheck blood pressure. If this is something that persists or move him back over to the acute side for further evaluation and possibly IV fluids. Most likely medication induced.  12:12 AM Patient remained hypotensive and was refusing to drink. He was brought over to the acute setting given 2 L of fluid. Now blood pressure is greater than 100. Feel that he is now safe to be moved back to the psych unit. Gwyneth Sprout, MD 12/09/15 2992  Gwyneth Sprout, MD 12/10/15 4268

## 2015-12-09 NOTE — Progress Notes (Signed)
enterd in d/c instructions  Please use the resources provided to assist you with finding a pcp for follow up care Schedule an appointment as soon as possible for a visit As needed

## 2015-12-09 NOTE — ED Notes (Addendum)
Pt, brought in by [employer], c/o SI w/ plan to jump off a bridge or hang himself and asking for heroin detox x 2 weeks.  Pt reports SI attempt x 1 week ago by heroin OD.  Denies precipitating factors except addiction.  Sts last use was yesterday.    Pt reports that his hands were numb when he woke up and R foot has been "dragging" x 2 days.  Pt ambulated to room w/o difficulty.  Denies weakness.
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
## 2015-12-09 NOTE — ED Notes (Addendum)
Report to RN Jari Favre, pending transfer to rm 25.
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
## 2015-12-09 NOTE — ED Notes (Signed)
Pt admitted to the unit after sharing that he wants to kills himself. He has a history of heroine use and is having feelings of hopelessness and helplessness. He went straight to bed and covered his head with a blanket. He woke up reluctantly and did not want to participate in the psych eval by tele at this time.

## 2015-12-10 DIAGNOSIS — F32A Depression, unspecified: Secondary | ICD-10-CM | POA: Insufficient documentation

## 2015-12-10 DIAGNOSIS — F329 Major depressive disorder, single episode, unspecified: Secondary | ICD-10-CM

## 2015-12-10 DIAGNOSIS — F1994 Other psychoactive substance use, unspecified with psychoactive substance-induced mood disorder: Secondary | ICD-10-CM | POA: Diagnosis not present

## 2015-12-10 NOTE — ED Notes (Signed)
Pt requesting meds for Heroin withdrawal.  Dr Elesa Massed notified.  No new orders given at this time.  Pt kicking door attempting to leave.  Redirected back to room.

## 2015-12-10 NOTE — BHH Suicide Risk Assessment (Cosign Needed)
Suicide Risk Assessment  Discharge Assessment   [facility] Discharge Suicide Risk Assessment   Principal Problem: Substance induced mood disorder [facility]) Discharge Diagnoses:  Patient Active Problem List   Diagnosis Date Noted  . Benzodiazepine dependence (HCC) [F13.20]   . Major depressive disorder, recurrent, severe without psychotic features (HCC) [F33.2]   . Substance induced mood disorder (HCC) [F19.94] 11/05/2014  . Suicidal ideation [R45.851] 11/05/2014  . Anxiety state, unspecified [F41.1] 01/01/2014  . MDD (major depressive disorder), recurrent episode, severe (HCC) [F33.2] 12/31/2013  . Rheumatoid arthritis (HCC) [M06.9] 05/17/2013  . Polysubstance abuse [F19.10] 05/17/2013    Total Time spent with patient: 20 minutes  Musculoskeletal: Strength & Muscle Tone: within normal limits Gait & Station: normal Patient leans: N/A  Psychiatric Specialty Exam:   Blood pressure 117/73, pulse 68, temperature 97.9 F (36.6 C), temperature source Oral, resp. rate 16, SpO2 100 %.There is no weight on file to calculate BMI.  General Appearance: Casual and Fairly Groomed  Patent attorney::  Good  Speech:  Clear and Coherent and Normal Rate  Volume:  Normal  Mood:  Euthymic  Affect:  Congruent  Thought Process:  Coherent, Goal Directed and Intact  Orientation:  Full (Time, Place, and Person)  Thought Content:  WDL  Suicidal Thoughts:  No  Homicidal Thoughts:  No  Memory:  Immediate;   Good Recent;   Good Remote;   Good  Judgement:  Good  Insight:  Good  Psychomotor Activity:  Normal  Concentration:  Good  Recall:  NA  Fund of Knowledge:Good  Language: Good  Akathisia:  NA  Handed:  Right  AIMS (if indicated):     Assets:  Desire for Improvement  ADL's:  Intact  Cognition: WNL     Mental Status Per Nursing Assessment::   On Admission:     Demographic Factors:  Male, Caucasian, Low socioeconomic status and Unemployed  Loss Factors: NA  Historical Factors: Prior suicide  attempts  Risk Reduction Factors:   Living with another person, especially a relative  Continued Clinical Symptoms:  Depression:   Insomnia Alcohol/Substance Abuse/Dependencies  Cognitive Features That Contribute To Risk:  Polarized thinking    Suicide Risk:  Minimal: No identifiable suicidal ideation.  Patients presenting with no risk factors but with morbid ruminations; may be classified as minimal risk based on the severity of the depressive symptoms  Follow-up Information    Schedule an appointment as soon as possible for a visit with Please use the resources provided to assist you with finding a pcp for follow up care.   Why:  As needed      Go to [facility].   Why:  follow up with your regular provider at [facility] information:   [address] [place] [address] [phone]      Plan Of Care/Follow-up recommendations:  Activity:  as tolerated Diet:  Regular  Earney Navy, NP    PMHNP-BC 12/10/2015, 3:37 PM

## 2015-12-10 NOTE — ED Notes (Signed)
Bed: YSA63 Expected date:  Expected time:  Means of arrival:  Comments: Hold for rm 25/[place]

## 2015-12-10 NOTE — Consult Note (Signed)
[facility] Face-to-Face Psychiatry Consult   Reason for Consult:  Opioid withdrawl , severe, Benzodiazepine use, Cannabis use, Cocaine abuse Referring Physician:  EDP Patient Identification: Steven Barker MRN:  [id number] Principal Diagnosis: Substance induced mood disorder ([place]) Diagnosis:   Patient Active Problem List   Diagnosis Date Noted  . Benzodiazepine dependence ([place]) [F13.20]   . Major depressive disorder, recurrent, severe without psychotic features ([place]) [F33.2]   . Substance induced mood disorder ([place]) [F19.94] 11/05/2014  . Suicidal ideation [R45.851] 11/05/2014  . Anxiety state, unspecified [F41.1] 01/01/2014  . MDD (major depressive disorder), recurrent episode, severe ([place]) [F33.2] 12/31/2013  . Rheumatoid arthritis ([place]) [M06.9] 05/17/2013  . Polysubstance abuse [F19.10] 05/17/2013    Total Time spent with patient: 45 minutes  Subjective:   Steven Barker is a 30 y.o. male patient admitted with Opioid withdrawl , severe, Benzodiazepine use, Cannabis use, Cocaine abuse  HPI:  Caucasian male, 29 years old was evaluated for Opioid withdrawal symptoms.  Patient also reports symptoms of Benzodiazepine use.  He is not able to quantify use and asked for another Benzodiazepine for his discomfort.  Patient reports that he uses Multiple illicit substances daily and self medicate with the drugs instead of taking his medications for depression.  Patient stated he was feeling suicidal when he was picked up by the Police yesterday but not today.  He also informed the Police that he attempted Suicide by OD last week.  Today patient denies feeling suicidal and stated that he is frustrated he can't stop using drugs. He declined offer for admission for detox and wanted to leave because he was not given Ativan.  Patient called his father and informed him that he is no longer suicidal.  Patient stated that he plans to keep his appointment with [facility] and resume his prescribed medications for  depression and anxiety.  Patient denies SI/HI/AVH.  Past Psychiatric History:  MDD, GAD,   Risk to Self: Suicidal Ideation: Yes-Currently Present Suicidal Intent: Yes-Currently Present Is patient at risk for suicide?: Yes Suicidal Plan?: Yes-Currently Present ("I'll probably hang myself") Specify Current Suicidal Plan: hang himself Access to Means:  ("with anything") What has been your use of drugs/alcohol within the last 12 months?: THC, Heroin, Cocaine, Benzo's How many times?: 4 Other Self Harm Risks: Denies Triggers for Past Attempts: Unknown Intentional Self Injurious Behavior: None Risk to Others: Homicidal Ideation: No Thoughts of Harm to Others: No Current Homicidal Intent: No Current Homicidal Plan: No Access to Homicidal Means: No Identified Victim: Denies History of harm to others?: No Assessment of Violence: None Noted Violent Behavior Description: Denies Does patient have access to weapons?: No Criminal Charges Pending?: Yes Describe Pending Criminal Charges: Assault on a male, DUI,  Does patient have a court date: Yes Court Date:  ("June something") Prior Inpatient Therapy: Prior Inpatient Therapy: Yes Prior Therapy Dates: multiple Prior Therapy Facilty/Provider(s): multiple Reason for Treatment: SA/SI Prior Outpatient Therapy: Prior Outpatient Therapy: Yes Prior Therapy Dates: Present Prior Therapy Facilty/Provider(s): [facility] Reason for Treatment: SA Does patient have an ACCT team?: No Does patient have Intensive In-House Services?  : No Does patient have [facility] services? : No Does patient have P4CC services?: No  Past Medical History:  Past Medical History  Diagnosis Date  . Anxiety   . Depression   . Rheumatoid arthritis(714.0)   . PTSD (post-traumatic stress disorder)   . Bipolar disorder ([place])    History reviewed. No pertinent past surgical history. Family History: History reviewed. No pertinent family history.  Family Psychiatric  History:  Denies Social History:  History  Alcohol Use  . Yes    Comment: occasionally     History  Drug Use  . Yes  . Special: Marijuana, Cocaine    Comment: heroin    Social History   Social History  . Marital Status: Single    Spouse Name: N/A  . Number of Children: N/A  . Years of Education: N/A   Social History Main Topics  . Smoking status: Current Every Day Smoker -- 0.50 packs/day for 10 years    Types: Cigarettes  . Smokeless tobacco: None  . Alcohol Use: Yes     Comment: occasionally  . Drug Use: Yes    Special: Marijuana, Cocaine     Comment: heroin  . Sexual Activity: Yes    Birth Control/ Protection: None   Other Topics Concern  . None   Social History Narrative   Additional Social History:    Allergies:   Allergies  Allergen Reactions  . Clindamycin/Lincomycin Nausea And Vomiting    Patient stated that he felt like he was going to die    Labs:  Results for orders placed or performed during the hospital encounter of 12/09/15 (from the past 48 hour(s))  Comprehensive metabolic panel     Status: Abnormal   Collection Time: 12/09/15 12:32 PM  Result Value Ref Range   Sodium 136 135 - 145 mmol/L   Potassium 4.4 3.5 - 5.1 mmol/L   Chloride 103 101 - 111 mmol/L   CO2 25 22 - 32 mmol/L   Glucose, Bld 100 (H) 65 - 99 mg/dL   BUN 12 6 - 20 mg/dL   Creatinine, Ser 0.86 0.61 - 1.24 mg/dL   Calcium 9.0 8.9 - 10.3 mg/dL   Total Protein 7.2 6.5 - 8.1 g/dL   Albumin 4.3 3.5 - 5.0 g/dL   AST 19 15 - 41 U/L   ALT 12 (L) 17 - 63 U/L   Alkaline Phosphatase 62 38 - 126 U/L   Total Bilirubin 0.6 0.3 - 1.2 mg/dL   GFR calc non Af Amer >60 >60 mL/min   GFR calc Af Amer >60 >60 mL/min    Comment: (NOTE) The eGFR has been calculated using the CKD EPI equation. This calculation has not been validated in all clinical situations. eGFR's persistently <60 mL/min signify possible Chronic Kidney Disease.    Anion gap 8 5 - 15  Ethanol (ETOH)     Status: None    Collection Time: 12/09/15 12:32 PM  Result Value Ref Range   Alcohol, Ethyl (B) <5 <5 mg/dL    Comment:        LOWEST DETECTABLE LIMIT FOR SERUM ALCOHOL IS 5 mg/dL FOR MEDICAL PURPOSES ONLY   Salicylate level     Status: None   Collection Time: 12/09/15 12:32 PM  Result Value Ref Range   Salicylate Lvl <5.5 2.8 - 30.0 mg/dL  Acetaminophen level     Status: Abnormal   Collection Time: 12/09/15 12:32 PM  Result Value Ref Range   Acetaminophen (Tylenol), Serum <10 (L) 10 - 30 ug/mL    Comment:        THERAPEUTIC CONCENTRATIONS VARY SIGNIFICANTLY. A RANGE OF 10-30 ug/mL MAY BE AN EFFECTIVE CONCENTRATION FOR MANY PATIENTS. HOWEVER, SOME ARE BEST TREATED AT CONCENTRATIONS OUTSIDE THIS RANGE. ACETAMINOPHEN CONCENTRATIONS >150 ug/mL AT 4 HOURS AFTER INGESTION AND >50 ug/mL AT 12 HOURS AFTER INGESTION ARE OFTEN ASSOCIATED WITH TOXIC REACTIONS.   CBC  Status: None   Collection Time: 12/09/15 12:32 PM  Result Value Ref Range   WBC 8.5 4.0 - 10.5 K/uL   RBC 4.82 4.22 - 5.81 MIL/uL   Hemoglobin 14.9 13.0 - 17.0 g/dL   HCT 44.1 39.0 - 52.0 %   MCV 91.5 78.0 - 100.0 fL   MCH 30.9 26.0 - 34.0 pg   MCHC 33.8 30.0 - 36.0 g/dL   RDW 13.3 11.5 - 15.5 %   Platelets 232 150 - 400 K/uL  Urine rapid drug screen (hosp performed) (Not at [facility])     Status: Abnormal   Collection Time: 12/09/15  1:12 PM  Result Value Ref Range   Opiates POSITIVE (A) NONE DETECTED   Cocaine POSITIVE (A) NONE DETECTED   Benzodiazepines POSITIVE (A) NONE DETECTED   Amphetamines NONE DETECTED NONE DETECTED   Tetrahydrocannabinol POSITIVE (A) NONE DETECTED   Barbiturates NONE DETECTED NONE DETECTED    Comment:        DRUG SCREEN FOR MEDICAL PURPOSES ONLY.  IF CONFIRMATION IS NEEDED FOR ANY PURPOSE, NOTIFY LAB WITHIN 5 DAYS.        LOWEST DETECTABLE LIMITS FOR URINE DRUG SCREEN Drug Class       Cutoff (ng/mL) Amphetamine      1000 Barbiturate      200 Benzodiazepine   341 Tricyclics       937 Opiates           300 Cocaine          300 THC              50   POC CBG, ED     Status: None   Collection Time: 12/09/15  6:17 PM  Result Value Ref Range   Glucose-Capillary 96 65 - 99 mg/dL  CBG monitoring, ED     Status: None   Collection Time: 12/09/15  9:14 PM  Result Value Ref Range   Glucose-Capillary 85 65 - 99 mg/dL    Current Facility-Administered Medications  Medication Dose Route Frequency Provider Last Rate Last Dose  . aspirin EC tablet 650 mg  650 mg Oral Daily PRN Francine Graven, DO      . citalopram (CELEXA) tablet 40 mg  40 mg Oral Daily Francine Graven, DO   40 mg at 12/10/15 9024  . gabapentin (NEURONTIN) capsule 400 mg  400 mg Oral QID Francine Graven, DO   400 mg at 12/10/15 1314  . hydrOXYzine (ATARAX/VISTARIL) tablet 50 mg  50 mg Oral Q6H PRN Francine Graven, DO   50 mg at 12/10/15 0734  . nicotine (NICODERM CQ - dosed in mg/24 hours) patch 21 mg  21 mg Transdermal Daily Francine Graven, DO   21 mg at 12/10/15 0973  . pantoprazole (PROTONIX) EC tablet 20 mg  20 mg Oral Daily Francine Graven, DO   20 mg at 12/10/15 5329  . promethazine (PHENERGAN) tablet 12.5 mg  12.5 mg Oral Q6H PRN Francine Graven, DO      . QUEtiapine (SEROQUEL) tablet 150 mg  150 mg Oral q morning - 10a Francine Graven, DO   150 mg at 12/10/15 9242  . QUEtiapine (SEROQUEL) tablet 300 mg  300 mg Oral QHS Francine Graven, DO   300 mg at 12/10/15 0116  . sucralfate (CARAFATE) 1 GM/10ML suspension 1 g  1 g Oral TID WC & HS Francine Graven, DO   1 g at 12/10/15 1131   Current Outpatient Prescriptions  Medication Sig Dispense Refill  . aspirin EC  325 MG tablet Take 650 mg by mouth daily as needed for moderate pain.    . citalopram (CELEXA) 40 MG tablet Take 1 tablet (40 mg total) by mouth daily. 30 tablet 0  . clonazePAM (KLONOPIN) 0.5 MG tablet Take 0.5 mg by mouth 2 (two) times daily as needed for anxiety.    . gabapentin (NEURONTIN) 400 MG capsule Take 1 capsule (400 mg total) by mouth 4  (four) times daily. 120 capsule 0  . hydrOXYzine (ATARAX/VISTARIL) 50 MG tablet Take 1 tablet (50 mg total) by mouth every 6 (six) hours as needed for anxiety. 30 tablet 0  . pantoprazole (PROTONIX) 20 MG tablet Take 1 tablet (20 mg total) by mouth daily. 30 tablet 0  . promethazine (PHENERGAN) 12.5 MG tablet Take 1 tablet (12.5 mg total) by mouth every 6 (six) hours as needed for nausea or vomiting. 30 tablet 0  . QUEtiapine (SEROQUEL) 300 MG tablet Take 1 tablet (300 mg total) by mouth at bedtime. 30 tablet 0  . QUEtiapine (SEROQUEL) 50 MG tablet Take 150 mg by mouth every morning.    . sucralfate (CARAFATE) 1 GM/10ML suspension Take 10 mLs (1 g total) by mouth 4 (four) times daily -  with meals and at bedtime. 420 mL 0  . nicotine (NICODERM CQ - DOSED IN MG/24 HOURS) 21 mg/24hr patch Place 1 patch (21 mg total) onto the skin daily. 28 patch 0    Musculoskeletal: Strength & Muscle Tone: within normal limits Gait & Station: normal Patient leans: N/A  Psychiatric Specialty Exam: Review of Systems  Constitutional: Negative.   HENT: Negative.   Eyes: Negative.   Respiratory: Negative.   Cardiovascular: Negative.   Gastrointestinal: Negative.   Genitourinary: Negative.   Musculoskeletal: Negative.   Skin: Negative.   Neurological: Negative.   Endo/Heme/Allergies: Negative.     Blood pressure 117/73, pulse 68, temperature 97.9 F (36.6 C), temperature source Oral, resp. rate 16, SpO2 100 %.There is no weight on file to calculate BMI.  General Appearance: Casual and Fairly Groomed  Engineer, water::  Good  Speech:  Clear and Coherent and Normal Rate  Volume:  Normal  Mood:  Euthymic  Affect:  Congruent  Thought Process:  Coherent, Goal Directed and Intact  Orientation:  Full (Time, Place, and Person)  Thought Content:  WDL  Suicidal Thoughts:  No  Homicidal Thoughts:  No  Memory:  Immediate;   Good Recent;   Good Remote;   Good  Judgement:  Good  Insight:  Good  Psychomotor  Activity:  Normal  Concentration:  Good  Recall:  NA  Fund of Knowledge:Good  Language: Good  Akathisia:  NA  Handed:  Right  AIMS (if indicated):     Assets:  Desire for Improvement  ADL's:  Intact  Cognition: WNL  Sleep:       Disposition:  Discharge home, follow up with your [place] providers at [facility], NP    PMHNP-BC 12/10/2015 3:14 PM Patient seen face-to-face for psychiatric evaluation, chart reviewed and case discussed with the physician extender and developed treatment plan. Reviewed the information documented and agree with the treatment plan. Corena Pilgrim, MD

## 2015-12-10 NOTE — BH Assessment (Signed)
Assessment Note  Steven Barker is an 30 y.o. male who presents to [facility] voluntarily for SI for the past two weeks. When asked if the patient had a plan he states "I'd probably hang myself." patient states that he will hang himself with "anything." Patient reports his triggers as his inability to stop using drugs and that he recently started to use Heroin over the past year. Patient reports that his family is no longer supportive due to his continued use. Patient reports that he uses 1 gram of heroin daily for the past year and last used two days ago, .5 grams of cocaine since age 67 two times per week and an unknown amount of benzodiazepines about "twice a month" and last used last week. Patient reports that he smokes THC daily and he doesn't drink alcohol as much now and cannot recall the last time he used alcohol. Patient UDS +opiates, +cocaine, +benzodiazepines, +THC. Patients BAL < 5 at time of assessment.  Patient denies HI and history of aggression. Patient denies access to weapons and firearms. Patient denies history of abuse/trauma. Patient reports that he has pending charges for assault on a male and DUI and is currently on probation for DUI. Patient states that his court date is "June something." Patient denies AVH and does not appear to be responding to internal stimuli.  Patient was alert and oriented x4. Patient was assessed in the [facility] and was in his room with the blanket pulled over his head. Patient asked several times "what is this for? Why do I have to do this?" Patient appeared irritable and mood and affect congruent. Patient reports that he sleeps "a couple hours" per night and reports that his appetite is based on his drug use. Patient reports "when I don't have dope it's poor, when I have dope it's good." Patient reports that he goes to [facility] "for a few years" and receives medication management.  Patient reports that he does not have therapy at [facility] but he has in the past. Patient  reports that he is prescribed 400mg  of Gabapentin 4x daily, 150mg  of Seroquel in the morning and 300mg  of Seroquel in the evening, and 40mg  of Celexa once a day.  Patient reports that he "tries" to take his medications but reports that he has not had his prescription filled "in months." Patient reports several previous inpatient admissions in the past and cannot recall the timeframe of his last admission.    Consulted with , MD who recommends treatment in the observation unit at this time.    Diagnosis: Substance Induced Mood Disorder                    Polysubstance Abuse  Past Medical History:  Past Medical History  Diagnosis Date  . Anxiety   . Depression   . Rheumatoid arthritis(714.0)   . PTSD (post-traumatic stress disorder)   . Bipolar disorder ([facility])     History reviewed. No pertinent past surgical history.  Family History: History reviewed. No pertinent family history.  Social History:  reports that he has been smoking Cigarettes.  He has a 5 pack-year smoking history. He does not have any smokeless tobacco history on file. He reports that he drinks alcohol. He reports that he uses illicit drugs (Marijuana and Cocaine).  Additional Social History:  Alcohol / Drug Use Pain Medications: See PTA Prescriptions: See PTA Over the Counter: See PTA History of alcohol / drug use?: Yes Substance #1 Name of Substance 1: Heroin  1 - Age of First Use: 29 1 - Amount (size/oz): 1 gram 1 - Frequency: daily 1 - Duration: ongoing 1 - Last Use / Amount: 2 days ago Substance #2 Name of Substance 2: Cocaine 2 - Age of First Use: 19 2 - Amount (size/oz): .5 gram 2 - Frequency: 2x/weekly 2 - Duration: ongoing 2 - Last Use / Amount: 1 week ago Substance #3 Name of Substance 3: Benzodiazepine  3 - Age of First Use: 17 3 - Amount (size/oz): UKN 3 - Frequency: 2x/month 3 - Duration: ongoing 3 - Last Use / Amount: last week UKN amount Substance #4 Name of Substance 4:  Alcohol 4 - Age of First Use: 17 4 - Amount (size/oz): "I don't drink much" 4 - Frequency: "I don't drink much" 4 - Duration: ongoing 4 - Last Use / Amount: "I can't remember" Substance #5 Name of Substance 5: THC 5 - Age of First Use: 14 5 - Amount (size/oz): 1/4 gram 5 - Frequency: daily 5 - Duration: ongoing 5 - Last Use / Amount: "couple days ago"  CIWA: CIWA-Ar BP: 98/62 mmHg Pulse Rate: 75 COWS:    Allergies:  Allergies  Allergen Reactions  . Clindamycin/Lincomycin Nausea And Vomiting    Patient stated that he felt like he was going to die    Home Medications:  (Not in a hospital admission)  OB/GYN Status:  No LMP for male patient.  General Assessment Data Location of Assessment: [facility] TTS Assessment: In system Is this a Tele or Face-to-Face Assessment?: Face-to-Face Is this an Initial Assessment or a Re-assessment for this encounter?: Initial Assessment Marital status: Single Is patient pregnant?: No Pregnancy Status: No Living Arrangements: Parent, Other relatives (father and grandmother) Can pt return to current living arrangement?: Yes Admission Status: Voluntary Is patient capable of signing voluntary admission?: Yes Referral Source: Self/Family/Friend     Crisis Care Plan Living Arrangements: Parent, Other relatives (father and grandmother) Name of Psychiatrist: Transport planner Name of Therapist: None  Education Status Is patient currently in school?: No Highest grade of school patient has completed: 8th  Risk to self with the past 6 months Suicidal Ideation: Yes-Currently Present Has patient been a risk to self within the past 6 months prior to admission? : No Suicidal Intent: Yes-Currently Present Has patient had any suicidal intent within the past 6 months prior to admission? : No Is patient at risk for suicide?: Yes Suicidal Plan?: Yes-Currently Present ("I'll probably hang myself") Has patient had any suicidal plan within the past 6 months prior  to admission? : No Specify Current Suicidal Plan: hang himself Access to Means:  ("with anything") What has been your use of drugs/alcohol within the last 12 months?: THC, Heroin, Cocaine, Benzo's Previous Attempts/Gestures: Yes How many times?: 4 Other Self Harm Risks: Denies Triggers for Past Attempts: Unknown Intentional Self Injurious Behavior: None Family Suicide History: Yes (grandfather) Recent stressful life event(s): Other (Comment) (" a lot") Persecutory voices/beliefs?: No Depression: Yes Depression Symptoms: Insomnia, Tearfulness, Fatigue, Loss of interest in usual pleasures Substance abuse history and/or treatment for substance abuse?: Yes Suicide prevention information given to non-admitted patients: Not applicable  Risk to Others within the past 6 months Homicidal Ideation: No Does patient have any lifetime risk of violence toward others beyond the six months prior to admission? : No Thoughts of Harm to Others: No Current Homicidal Intent: No Current Homicidal Plan: No Access to Homicidal Means: No Identified Victim: Denies History of harm to others?: No Assessment of Violence:  None Noted Violent Behavior Description: Denies Does patient have access to weapons?: No Criminal Charges Pending?: Yes Describe Pending Criminal Charges: Assault on a male, DUI,  Does patient have a court date: Yes Court Date:  ("June something") Is patient on probation?: Yes (for DUI)  Psychosis Hallucinations: None noted Delusions: None noted  Mental Status Report Appearance/Hygiene: Unable to Assess Eye Contact: Unable to Assess Motor Activity: Unable to assess Speech: Logical/coherent Level of Consciousness: Alert Mood: Irritable Affect: Irritable Anxiety Level: None Thought Processes: Coherent, Relevant Judgement: Unimpaired Orientation: Person, Place, Time, Situation, Appropriate for developmental age Obsessive Compulsive Thoughts/Behaviors: None  Cognitive  Functioning Concentration: Poor Memory: Recent Intact, Remote Intact IQ: Average Insight: Poor Impulse Control: Poor Appetite: Poor Weight Loss: 0 Weight Gain: 0 Sleep: Decreased Total Hours of Sleep:  ("a couple hours") Vegetative Symptoms: None  ADLScreening [facility] Assessment Services) Patient's cognitive ability adequate to safely complete daily activities?: Yes Patient able to express need for assistance with ADLs?: Yes Independently performs ADLs?: Yes (appropriate for developmental age)  Prior Inpatient Therapy Prior Inpatient Therapy: Yes Prior Therapy Dates: multiple Prior Therapy Facilty/Provider(s): multiple Reason for Treatment: SA/SI  Prior Outpatient Therapy Prior Outpatient Therapy: Yes Prior Therapy Dates: Present Prior Therapy Facilty/Provider(s): [facility] Reason for Treatment: SA Does patient have an ACCT team?: No Does patient have Intensive In-House Services?  : No Does patient have [facility] services? : No Does patient have P4CC services?: No  ADL Screening (condition at time of admission) Patient's cognitive ability adequate to safely complete daily activities?: Yes Is the patient deaf or have difficulty hearing?: No Does the patient have difficulty seeing, even when wearing glasses/contacts?: No Does the patient have difficulty concentrating, remembering, or making decisions?: No Patient able to express need for assistance with ADLs?: Yes Does the patient have difficulty dressing or bathing?: No Independently performs ADLs?: Yes (appropriate for developmental age) Does the patient have difficulty walking or climbing stairs?: No Weakness of Legs: None Weakness of Arms/Hands: None  Home Assistive Devices/Equipment Home Assistive Devices/Equipment: None  Therapy Consults (therapy consults require a physician order) PT Evaluation Needed: No OT Evalulation Needed: No Abuse/Neglect Assessment (Assessment to be complete while patient is alone) Physical  Abuse: Denies Verbal Abuse: Denies Sexual Abuse: Denies Exploitation of patient/patient's resources: Denies Self-Neglect: Denies Values / Beliefs Cultural Requests During Hospitalization: None Spiritual Requests During Hospitalization: None Consults Spiritual Care Consult Needed: No Social Work Consult Needed: No Merchant navy officer (For Healthcare) Does patient have an advance directive?: No Would patient like information on creating an advanced directive?: No - patient declined information Nutrition Screen- [facility] Adult[facility] Patient's home diet:  (pt. given sandwich and soda.)  Additional Information 1:1 In Past 12 Months?: No CIRT Risk: No Elopement Risk: No Does patient have medical clearance?: Yes     Disposition:  Disposition Initial Assessment Completed for this Encounter: Yes Disposition of Patient: Other dispositions (observation per Dr. Jannifer Franklin ) Other disposition(s): Other (Comment) (observation per Dr. Jannifer Franklin )  On Site Evaluation by:   Reviewed with Physician:    Shaman Muscarella 12/10/2015 1:23 PM

## 2015-12-10 NOTE — ED Notes (Signed)
Pt guarded . Presents with increased irritability. Reports passive SI. Will continue to monitor on special checks q 15 mins for safety.

## 2015-12-10 NOTE — ED Notes (Addendum)
Pt's dad called nurses station and stated son called him and stated he was feeling suicidal, and wanted to kill himself.

## 2015-12-10 NOTE — ED Notes (Signed)
Saline lock to Rt hand DCed by [employer].

## 2015-12-10 NOTE — ED Notes (Addendum)
Pt discharged home per MD order. Pt denies SI/HI at discharge. Pt referring to himself as "the dope man" and reports he is going to do dope once he is discharged. Julieanne Cotton, FNP informed. Discharge summary reviewed with pt. Pt verbalizes understanding. Pt signed e-signature. Ambulatory off unit.

## 2015-12-10 NOTE — ED Notes (Signed)
This nurse spoke with pt father- Luisa Hart, per pt and psychiatry team request. Pt father reports that he feels the pt is safe to be discharged home. Pt father reports he does not feel the pt is a danger to self or danger to others. Pt father reports he will call 911 if he feels the pt is in danger. Pt father reports the pt will be coming to his home at discharge and that his home is his residence. Pt is denying SI/HI at this time. Pt is requesting to be discharged.

## 2015-12-10 NOTE — ED Notes (Signed)
Received pt to rm #39, AAO x 3, via wheelchair, no distress noted.  Monitoring for safety, Q 15 min checks in effect.

## 2016-02-17 ENCOUNTER — Emergency Department (HOSPITAL_COMMUNITY)
Admission: EM | Admit: 2016-02-17 | Discharge: 2016-02-17 | Disposition: A | Discharge status: Home / Self Care | Payer: No Typology Code available for payment source | Attending: Emergency Medicine | Admitting: Emergency Medicine

## 2016-02-17 ENCOUNTER — Encounter (HOSPITAL_COMMUNITY): Payer: Self-pay | Admitting: *Deleted

## 2016-02-17 DIAGNOSIS — R112 Nausea with vomiting, unspecified: Secondary | ICD-10-CM | POA: Insufficient documentation

## 2016-02-17 DIAGNOSIS — R1013 Epigastric pain: Secondary | ICD-10-CM | POA: Insufficient documentation

## 2016-02-17 DIAGNOSIS — R109 Unspecified abdominal pain: Secondary | ICD-10-CM

## 2016-02-17 DIAGNOSIS — F17210 Nicotine dependence, cigarettes, uncomplicated: Secondary | ICD-10-CM | POA: Insufficient documentation

## 2016-02-17 LAB — COMPREHENSIVE METABOLIC PANEL
ALK PHOS: 62 U/L (ref 38–126)
ALT: 13 U/L — AB (ref 17–63)
ANION GAP: 7 (ref 5–15)
AST: 18 U/L (ref 15–41)
Albumin: 4.1 g/dL (ref 3.5–5.0)
BUN: 8 mg/dL (ref 6–20)
CHLORIDE: 105 mmol/L (ref 101–111)
CO2: 27 mmol/L (ref 22–32)
Calcium: 9.5 mg/dL (ref 8.9–10.3)
Creatinine, Ser: 0.89 mg/dL (ref 0.61–1.24)
Glucose, Bld: 101 mg/dL — ABNORMAL HIGH (ref 65–99)
POTASSIUM: 4.4 mmol/L (ref 3.5–5.1)
SODIUM: 139 mmol/L (ref 135–145)
Total Bilirubin: 0.6 mg/dL (ref 0.3–1.2)
Total Protein: 7 g/dL (ref 6.5–8.1)

## 2016-02-17 LAB — URINALYSIS, ROUTINE W REFLEX MICROSCOPIC
Bilirubin Urine: NEGATIVE
GLUCOSE, UA: NEGATIVE mg/dL
HGB URINE DIPSTICK: NEGATIVE
Ketones, ur: NEGATIVE mg/dL
LEUKOCYTES UA: NEGATIVE
Nitrite: NEGATIVE
Protein, ur: NEGATIVE mg/dL
SPECIFIC GRAVITY, URINE: 1.023 (ref 1.005–1.030)
pH: 6.5 (ref 5.0–8.0)

## 2016-02-17 LAB — CBC
HEMATOCRIT: 46 % (ref 39.0–52.0)
HEMOGLOBIN: 15.2 g/dL (ref 13.0–17.0)
MCH: 30.2 pg (ref 26.0–34.0)
MCHC: 33 g/dL (ref 30.0–36.0)
MCV: 91.5 fL (ref 78.0–100.0)
Platelets: 335 10*3/uL (ref 150–400)
RBC: 5.03 MIL/uL (ref 4.22–5.81)
RDW: 13.5 % (ref 11.5–15.5)
WBC: 9.5 10*3/uL (ref 4.0–10.5)

## 2016-02-17 LAB — LIPASE, BLOOD: LIPASE: 29 U/L (ref 11–51)

## 2016-02-17 MED ORDER — MORPHINE SULFATE (PF) 4 MG/ML IV SOLN
4.0000 mg | Freq: Once | INTRAVENOUS | Status: AC
  Administered 2016-02-17: 4 mg via INTRAVENOUS
  Filled 2016-02-17: qty 1

## 2016-02-17 MED ORDER — ONDANSETRON HCL 4 MG/2ML IJ SOLN
4.0000 mg | Freq: Once | INTRAMUSCULAR | Status: AC
  Administered 2016-02-17: 4 mg via INTRAVENOUS
  Filled 2016-02-17: qty 2

## 2016-02-17 MED ORDER — GABAPENTIN 400 MG PO CAPS: 400.0000 mg | ORAL_CAPSULE | Freq: Three times a day (TID) | ORAL | Status: AC

## 2016-02-17 MED ORDER — GI COCKTAIL ~~LOC~~
30.0000 mL | Freq: Once | ORAL | Status: AC
  Administered 2016-02-17: 30 mL via ORAL
  Filled 2016-02-17: qty 30

## 2016-02-17 MED ORDER — PANTOPRAZOLE SODIUM 20 MG PO TBEC: 20.0000 mg | DELAYED_RELEASE_TABLET | Freq: Every day | ORAL | Status: AC

## 2016-02-17 MED ORDER — SODIUM CHLORIDE 0.9 % IV BOLUS (SEPSIS)
1000.0000 mL | Freq: Once | INTRAVENOUS | Status: AC
  Administered 2016-02-17: 1000 mL via INTRAVENOUS

## 2016-02-17 MED ORDER — PROMETHAZINE HCL 12.5 MG PO TABS: 12.5000 mg | ORAL_TABLET | Freq: Four times a day (QID) | ORAL | Status: DC | PRN

## 2016-02-17 NOTE — ED Notes (Signed)
Pt arrived by ems for LUQ pain x 2 days with n/v. History of same but has not followed up with referral.

## 2016-02-17 NOTE — ED Notes (Signed)
Pt requesting something for pain; c/o continued L sided abdominal pain 7/10; RN notified

## 2016-02-17 NOTE — Discharge Instructions (Signed)
Follow-up with your primary care provider or the health Department on Monday regarding your visit to the emergency department today. Follow-up with your psychiatrist on Monday.  Return to emergency department if you experience worsening abdominal pain, uncontrolled vomiting, blood in your vomit, fever.  Abdominal Pain, Adult Many things can cause abdominal pain. Usually, abdominal pain is not caused by a disease and will improve without treatment. It can often be observed and treated at home. Your health care provider will do a physical exam and possibly order blood tests and X-rays to help determine the seriousness of your pain. However, in many cases, more time must pass before a clear cause of the pain can be found. Before that point, your health care provider may not know if you need more testing or further treatment. HOME CARE INSTRUCTIONS Monitor your abdominal pain for any changes. The following actions may help to alleviate any discomfort you are experiencing:  Only take over-the-counter or prescription medicines as directed by your health care provider.  Do not take laxatives unless directed to do so by your health care provider.  Try a clear liquid diet (broth, tea, or water) as directed by your health care provider. Slowly move to a bland diet as tolerated. SEEK MEDICAL CARE IF:  You have unexplained abdominal pain.  You have abdominal pain associated with nausea or diarrhea.  You have pain when you urinate or have a bowel movement.  You experience abdominal pain that wakes you in the night.  You have abdominal pain that is worsened or improved by eating food.  You have abdominal pain that is worsened with eating fatty foods.  You have a fever. SEEK IMMEDIATE MEDICAL CARE IF:  Your pain does not go away within 2 hours.  You keep throwing up (vomiting).  Your pain is felt only in portions of the abdomen, such as the right side or the left lower portion of the  abdomen.  You pass bloody or black tarry stools. MAKE SURE YOU:  Understand these instructions.  Will watch your condition.  Will get help right away if you are not doing well or get worse.   This information is not intended to replace advice given to you by your health care provider. Make sure you discuss any questions you have with your health care provider.   Document Released: 05/23/2005 Document Revised: 05/04/2015 Document Reviewed: 04/22/2013 Elsevier Interactive Patient Education [employer]

## 2016-02-17 NOTE — ED Notes (Signed)
Pt given bus pass and departed in NAD, refused use of wheelchair.

## 2016-02-17 NOTE — ED Notes (Signed)
Pt given ice water to drink

## 2016-02-17 NOTE — ED Provider Notes (Signed)
CSN: [id number]     Arrival date & time 02/17/16  1201 History   First MD Initiated Contact with Patient 02/17/16 1305     Chief Complaint  Patient presents with  . Abdominal Pain     (Consider location/radiation/quality/duration/timing/severity/associated sxs/prior Treatment) HPI   Patient is a 30 year old male with a history of anxiety, depression, RA, PTSD, bipolar present the ED with left lower quadrant pain and epigastric pain for 2 days. He states his pain is very similar to his chronic abdominal pain. He states he will go months without having it and it comes on out of no where. This progressively got worse in the last 2 days it starts in the epigastric region and radiates to left lower quadrant, at 6/10, he describes it as a "squeezing sensation". Associated vomiting with one episode of unwitnessed hematemesis today. He denies any other associated symptoms. Patient denies using alcohol. He says he does smoke marijuana 2-3 times a week but has not had any in a week. He denies other drug use. He denies headache, fever, dysuria, hematuria, penile discharge, cough.  Past Medical History  Diagnosis Date  . Anxiety   . Depression   . Rheumatoid arthritis(714.0)   . PTSD (post-traumatic stress disorder)   . Bipolar disorder (HCC)    History reviewed. No pertinent past surgical history. History reviewed. No pertinent family history. Social History  Substance Use Topics  . Smoking status: Current Every Day Smoker -- 0.50 packs/day for 10 years    Types: Cigarettes  . Smokeless tobacco: None  . Alcohol Use: Yes     Comment: occasionally    Review of Systems  Constitutional: Negative for fever and chills.  HENT: Negative for sore throat.   Eyes: Negative for visual disturbance.  Respiratory: Negative for cough and shortness of breath.   Cardiovascular: Negative for chest pain.  Gastrointestinal: Positive for nausea, vomiting and abdominal pain. Negative for diarrhea, constipation  and blood in stool.  Genitourinary: Negative for dysuria and hematuria.  Musculoskeletal: Negative for back pain and neck pain.  Skin: Negative for rash.  Neurological: Negative for syncope, weakness, numbness and headaches.      Allergies  Clindamycin/lincomycin  Home Medications   Prior to Admission medications   Medication Sig Start Date End Date Taking? Authorizing Provider  carbamazepine (TEGRETOL) 200 MG tablet Take 200 mg by mouth 3 (three) times daily. 02/07/16 03/08/16 Yes Historical Provider, MD  citalopram (CELEXA) 40 MG tablet Take 1 tablet (40 mg total) by mouth daily. 04/24/15  Yes Nelva Nay, MD  QUEtiapine (SEROQUEL) 300 MG tablet Take 1 tablet (300 mg total) by mouth at bedtime. Patient taking differently: Take 150-300 mg by mouth 2 (two) times daily. Takes 150mg  in am and 300mg  in pm 04/24/15  Yes , MD  gabapentin (NEURONTIN) 400 MG capsule Take 1 capsule (400 mg total) by mouth 3 (three) times daily. 02/17/16   Nelva Nay, PA  hydrOXYzine (ATARAX/VISTARIL) 50 MG tablet Take 1 tablet (50 mg total) by mouth every 6 (six) hours as needed for anxiety. 04/24/15   Jerre Simon, MD  nicotine (NICODERM CQ - DOSED IN MG/24 HOURS) 21 mg/24hr patch Place 1 patch (21 mg total) onto the skin daily. 04/24/15   Fuller Plan, MD  pantoprazole (PROTONIX) 20 MG tablet Take 1 tablet (20 mg total) by mouth daily. 02/17/16   Fuller Plan, PA  promethazine (PHENERGAN) 12.5 MG tablet Take 1 tablet (12.5 mg total) by mouth every 6 (six)  hours as needed for nausea or vomiting. 02/17/16   Jerre Simon, PA  sucralfate (CARAFATE) 1 GM/10ML suspension Take 10 mLs (1 g total) by mouth 4 (four) times daily -  with meals and at bedtime. 04/24/15   Fuller Plan, MD   BP 104/65 mmHg  Pulse 70  Temp(Src) 98.2 F (36.8 C) (Oral)  Resp 16  Ht  (1.702 m)  Wt 58.65 kg  BMI 20.25 kg/m2  SpO2 100% Physical Exam  Constitutional: He appears well-developed and  well-nourished. No distress.  HENT:  Head: Normocephalic and atraumatic.  Eyes: Conjunctivae are normal.  Neck: Normal range of motion.  Cardiovascular: Normal rate, regular rhythm and normal heart sounds.  Exam reveals no gallop and no friction rub.   No murmur heard. Pulses:      Posterior tibial pulses are 2+ on the right side, and 2+ on the left side.  Pulmonary/Chest: Effort normal.  Abdominal: Soft. Normal appearance and bowel sounds are normal. He exhibits no distension. There is tenderness in the epigastric area and left lower quadrant. There is no rigidity, no rebound, no guarding, no CVA tenderness and no tenderness at McBurney's point.  Musculoskeletal: Normal range of motion. He exhibits no edema.  Neurological: He is alert. Coordination normal.  Skin: Skin is warm and dry. No rash noted.  Psychiatric: He has a normal mood and affect. His behavior is normal.  Nursing note and vitals reviewed.   ED Course  Procedures (including critical care time)  4:15pm pt states his nausea has improved but his abd pain has not. Will give pain meds and PO challenge  Labs Review Labs Reviewed  COMPREHENSIVE METABOLIC PANEL - Abnormal; Notable for the following:    Glucose, Bld 101 (*)    ALT 13 (*)    All other components within normal limits  LIPASE, BLOOD  CBC  URINALYSIS, ROUTINE W REFLEX MICROSCOPIC (NOT AT [facility])    Imaging Review No results found. I have personally reviewed and evaluated these images and lab results as part of my medical decision-making.   EKG Interpretation None      MDM   Final diagnoses:  Abdominal pain, unspecified abdominal location  Non-intractable vomiting with nausea, vomiting of unspecified type   Patient is nontoxic, nonseptic appearing, in no apparent distress.  Patient's pain and other symptoms adequately managed in emergency department.  Fluid bolus given.  Labs and vitals reviewed.  Patient does not meet the SIRS or Sepsis criteria.  On  repeat exam patient does not have a surgical abdomin and there are no peritoneal signs.  No indication of appendicitis, bowel obstruction, bowel perforation, cholecystitis, upper GI bleed, or diverticulitis. Labs unremarkable. This is a chronic condition and the pt states his gabapentin helps and he has been out of it for 5 days. Discharged patient with 3 days of gabapentin, Phenergan, and Protonix. Patient states he stopped taking his Protonix.  Of note: I spoke to [place] at his pharmacy today and she states that he has been filling his gabapentin at their pharmacy and transferring it other pharmacies for refills because they will not refill it before it is due. This is why I only gave him 3 days worth and told to follow-up with his psychiatrist to get a refill prescription.  Patient discharged home with symptomatic treatment and given strict instructions for follow-up with their primary care physician or health department and psychiatrist.  I have also discussed reasons to return immediately to the ER.  Patient  expresses understanding and agrees with plan.   I discussed the case with and the pt was seen by Dr. Madilyn Hook who agrees with the above plan.    Jerre Simon, PA 02/17/16 1702  Jerre Simon, PA 02/17/16 1703  Tilden Fossa, MD 02/18/16 [phone]

## 2016-03-26 ENCOUNTER — Encounter (HOSPITAL_COMMUNITY): Payer: Self-pay | Admitting: Emergency Medicine

## 2016-03-26 ENCOUNTER — Emergency Department (HOSPITAL_COMMUNITY)
Admission: EM | Admit: 2016-03-26 | Discharge: 2016-03-28 | Disposition: A | Discharge status: Home / Self Care | Payer: Federal, State, Local not specified - Other | Attending: Emergency Medicine | Admitting: Emergency Medicine

## 2016-03-26 DIAGNOSIS — F1290 Cannabis use, unspecified, uncomplicated: Secondary | ICD-10-CM | POA: Insufficient documentation

## 2016-03-26 DIAGNOSIS — R21 Rash and other nonspecific skin eruption: Secondary | ICD-10-CM | POA: Insufficient documentation

## 2016-03-26 DIAGNOSIS — F333 Major depressive disorder, recurrent, severe with psychotic symptoms: Secondary | ICD-10-CM | POA: Insufficient documentation

## 2016-03-26 DIAGNOSIS — F332 Major depressive disorder, recurrent severe without psychotic features: Secondary | ICD-10-CM

## 2016-03-26 DIAGNOSIS — F17210 Nicotine dependence, cigarettes, uncomplicated: Secondary | ICD-10-CM | POA: Insufficient documentation

## 2016-03-26 DIAGNOSIS — F1110 Opioid abuse, uncomplicated: Secondary | ICD-10-CM | POA: Insufficient documentation

## 2016-03-26 DIAGNOSIS — F1410 Cocaine abuse, uncomplicated: Secondary | ICD-10-CM | POA: Insufficient documentation

## 2016-03-26 DIAGNOSIS — Z79899 Other long term (current) drug therapy: Secondary | ICD-10-CM | POA: Insufficient documentation

## 2016-03-26 DIAGNOSIS — R45851 Suicidal ideations: Secondary | ICD-10-CM

## 2016-03-26 NOTE — ED Triage Notes (Signed)
Pt called out because he was having hives of unknown origin and now states that he is suicidal and tried overdosing 3 times this week. Pt had a pipe and a razor blade on the porch when [facility] pulled up. Alert. Hallucinating.

## 2016-03-27 ENCOUNTER — Encounter (HOSPITAL_COMMUNITY): Payer: Self-pay | Admitting: Licensed Clinical Social Worker

## 2016-03-27 LAB — CBC
HCT: 40.5 % (ref 39.0–52.0)
Hemoglobin: 13.9 g/dL (ref 13.0–17.0)
MCH: 31.3 pg (ref 26.0–34.0)
MCHC: 34.3 g/dL (ref 30.0–36.0)
MCV: 91.2 fL (ref 78.0–100.0)
PLATELETS: 241 10*3/uL (ref 150–400)
RBC: 4.44 MIL/uL (ref 4.22–5.81)
RDW: 13.3 % (ref 11.5–15.5)
WBC: 8 10*3/uL (ref 4.0–10.5)

## 2016-03-27 LAB — RAPID URINE DRUG SCREEN, HOSP PERFORMED
Amphetamines: NOT DETECTED
Barbiturates: NOT DETECTED
Benzodiazepines: POSITIVE — AB
Cocaine: POSITIVE — AB
OPIATES: POSITIVE — AB
Tetrahydrocannabinol: POSITIVE — AB

## 2016-03-27 LAB — COMPREHENSIVE METABOLIC PANEL
ALT: 14 U/L — AB (ref 17–63)
AST: 23 U/L (ref 15–41)
Albumin: 5.2 g/dL — ABNORMAL HIGH (ref 3.5–5.0)
Alkaline Phosphatase: 62 U/L (ref 38–126)
Anion gap: 7 (ref 5–15)
BILIRUBIN TOTAL: 1.2 mg/dL (ref 0.3–1.2)
BUN: 14 mg/dL (ref 6–20)
CALCIUM: 9.6 mg/dL (ref 8.9–10.3)
CHLORIDE: 100 mmol/L — AB (ref 101–111)
CO2: 28 mmol/L (ref 22–32)
CREATININE: 1.05 mg/dL (ref 0.61–1.24)
Glucose, Bld: 81 mg/dL (ref 65–99)
Potassium: 4 mmol/L (ref 3.5–5.1)
Sodium: 135 mmol/L (ref 135–145)
TOTAL PROTEIN: 7.8 g/dL (ref 6.5–8.1)

## 2016-03-27 LAB — ACETAMINOPHEN LEVEL: Acetaminophen (Tylenol), Serum: <10 ug/mL — ABNORMAL LOW (ref 10–30)

## 2016-03-27 LAB — ETHANOL

## 2016-03-27 LAB — SALICYLATE LEVEL

## 2016-03-27 MED ORDER — METHOCARBAMOL 500 MG PO TABS
500.0000 mg | ORAL_TABLET | Freq: Three times a day (TID) | ORAL | Status: DC | PRN
  Administered 2016-03-27: 500 mg via ORAL
  Filled 2016-03-27: qty 1

## 2016-03-27 MED ORDER — CLONIDINE HCL 0.1 MG PO TABS
0.1000 mg | ORAL_TABLET | Freq: Four times a day (QID) | ORAL | Status: DC
  Administered 2016-03-27 – 2016-03-28 (×2): 0.1 mg via ORAL
  Filled 2016-03-27 (×2): qty 1

## 2016-03-27 MED ORDER — CARBAMAZEPINE 200 MG PO TABS
200.0000 mg | ORAL_TABLET | Freq: Three times a day (TID) | ORAL | Status: DC
  Administered 2016-03-27 – 2016-03-28 (×3): 200 mg via ORAL
  Filled 2016-03-27 (×4): qty 1

## 2016-03-27 MED ORDER — PANTOPRAZOLE SODIUM 20 MG PO TBEC
20.0000 mg | DELAYED_RELEASE_TABLET | Freq: Every day | ORAL | Status: DC
  Administered 2016-03-27 – 2016-03-28 (×2): 20 mg via ORAL
  Filled 2016-03-27 (×3): qty 1

## 2016-03-27 MED ORDER — DICYCLOMINE HCL 20 MG PO TABS: 20.0000 mg | ORAL_TABLET | Freq: Four times a day (QID) | ORAL | Status: DC | PRN

## 2016-03-27 MED ORDER — LORATADINE 10 MG PO TABS
10.0000 mg | ORAL_TABLET | Freq: Every day | ORAL | Status: DC
  Administered 2016-03-27 – 2016-03-28 (×2): 10 mg via ORAL
  Filled 2016-03-27 (×3): qty 1

## 2016-03-27 MED ORDER — LOPERAMIDE HCL 2 MG PO CAPS: 2.0000 mg | ORAL_CAPSULE | ORAL | Status: DC | PRN

## 2016-03-27 MED ORDER — GABAPENTIN 400 MG PO CAPS
400.0000 mg | ORAL_CAPSULE | Freq: Three times a day (TID) | ORAL | Status: DC
  Administered 2016-03-27: 400 mg via ORAL
  Filled 2016-03-27: qty 1

## 2016-03-27 MED ORDER — NAPROXEN 500 MG PO TABS: 500.0000 mg | ORAL_TABLET | Freq: Two times a day (BID) | ORAL | Status: DC | PRN

## 2016-03-27 MED ORDER — CITALOPRAM HYDROBROMIDE 10 MG PO TABS
40.0000 mg | ORAL_TABLET | Freq: Every day | ORAL | Status: DC
  Administered 2016-03-27 – 2016-03-28 (×2): 40 mg via ORAL
  Filled 2016-03-27 (×2): qty 4

## 2016-03-27 MED ORDER — DIPHENHYDRAMINE HCL 25 MG PO CAPS
25.0000 mg | ORAL_CAPSULE | Freq: Once | ORAL | Status: DC
  Filled 2016-03-27 (×2): qty 1

## 2016-03-27 MED ORDER — LORAZEPAM 1 MG PO TABS: 1.0000 mg | ORAL_TABLET | Freq: Three times a day (TID) | ORAL | Status: DC | PRN

## 2016-03-27 MED ORDER — CLONIDINE HCL 0.1 MG PO TABS: 0.1000 mg | ORAL_TABLET | ORAL | Status: DC

## 2016-03-27 MED ORDER — ZOLPIDEM TARTRATE 5 MG PO TABS: 5.0000 mg | ORAL_TABLET | Freq: Every evening | ORAL | Status: DC | PRN

## 2016-03-27 MED ORDER — IBUPROFEN 200 MG PO TABS: 600.0000 mg | ORAL_TABLET | Freq: Three times a day (TID) | ORAL | Status: DC | PRN

## 2016-03-27 MED ORDER — HYDROXYZINE HCL 25 MG PO TABS
25.0000 mg | ORAL_TABLET | Freq: Four times a day (QID) | ORAL | Status: DC | PRN
  Administered 2016-03-27: 25 mg via ORAL
  Filled 2016-03-27 (×2): qty 1

## 2016-03-27 MED ORDER — DIPHENHYDRAMINE HCL 25 MG PO CAPS
25.0000 mg | ORAL_CAPSULE | ORAL | Status: DC | PRN
  Administered 2016-03-27: 25 mg via ORAL

## 2016-03-27 MED ORDER — CLONIDINE HCL 0.1 MG PO TABS: 0.1000 mg | ORAL_TABLET | Freq: Every day | ORAL | Status: DC

## 2016-03-27 MED ORDER — DOXYCYCLINE HYCLATE 100 MG PO TABS
100.0000 mg | ORAL_TABLET | Freq: Once | ORAL | Status: DC
  Filled 2016-03-27 (×2): qty 1

## 2016-03-27 MED ORDER — ONDANSETRON HCL 4 MG PO TABS: 4.0000 mg | ORAL_TABLET | Freq: Three times a day (TID) | ORAL | Status: DC | PRN

## 2016-03-27 MED ORDER — ACETAMINOPHEN 325 MG PO TABS: 650.0000 mg | ORAL_TABLET | ORAL | Status: DC | PRN

## 2016-03-27 MED ORDER — ALUM & MAG HYDROXIDE-SIMETH 200-200-20 MG/5ML PO SUSP: 30.0000 mL | ORAL | Status: DC | PRN

## 2016-03-27 MED ORDER — DIPHENHYDRAMINE HCL 25 MG PO CAPS
25.0000 mg | ORAL_CAPSULE | Freq: Once | ORAL | Status: AC
  Administered 2016-03-27: 25 mg via ORAL
  Filled 2016-03-27: qty 1

## 2016-03-27 MED ORDER — NICOTINE 7 MG/24HR TD PT24
7.0000 mg | MEDICATED_PATCH | Freq: Every day | TRANSDERMAL | Status: DC
Start: 2016-03-27 — End: 2016-03-28
  Administered 2016-03-27 – 2016-03-28 (×2): 7 mg via TRANSDERMAL
  Filled 2016-03-27 (×2): qty 1

## 2016-03-27 MED ORDER — DOXYCYCLINE HYCLATE 100 MG PO TABS
100.0000 mg | ORAL_TABLET | Freq: Two times a day (BID) | ORAL | Status: DC
  Administered 2016-03-27 – 2016-03-28 (×3): 100 mg via ORAL
  Filled 2016-03-27 (×3): qty 1

## 2016-03-27 MED ORDER — QUETIAPINE FUMARATE 300 MG PO TABS: 150.0000 mg | ORAL_TABLET | Freq: Two times a day (BID) | ORAL | Status: DC

## 2016-03-27 MED ORDER — ONDANSETRON 4 MG PO TBDP
4.0000 mg | ORAL_TABLET | Freq: Four times a day (QID) | ORAL | Status: DC | PRN
  Administered 2016-03-27: 4 mg via ORAL
  Filled 2016-03-27: qty 1

## 2016-03-27 NOTE — ED Provider Notes (Signed)
[facility]-EMERGENCY DEPT Provider Note   CSN: [id number] Arrival date & time: 03/26/16  2351  First Provider Contact:  First MD Initiated Contact with Patient 03/27/16 0053   By signing my name below, I, Bridgette Habermann, attest that this documentation has been prepared under the direction and in the presence of Dione Booze, MD. Electronically Signed: Bridgette Habermann, ED Scribe. 03/27/16. 1:05 AM.  History   Chief Complaint Chief Complaint  Patient presents with  . Urticaria  . Suicidal   HPI Comments: Steven Barker is a 30 y.o. male with h/o anxiety, bipolar disorder, PTSD, and depression who presents to the Emergency Department by EMS for SI that has gradually worsened this week. Pt notes that he has tried overdosing on Heroin three times this week. His last time was 4 days ago. Pt states he has snorted cocaine. Per EMS, pt had a pipe and a razor blade on the porch when they arrived. He notes he has hallucinations and states he sees people in the woods at night but they aren't really there. Denies auditory hallucinations.  Pt is also complaining of sudden onset, constant pruritic rash. Per pt, he has ticks on his body that are "so small you can't see them". No alleviating factors noted. No new soaps, lotions, detergents, foods, animals, plants, or medications. Pt has been off his regular medications for about three weeks. Denies alcohol use. Pt is a cigarette smoker. Denies any other drug use. Denies trouble breathing or swallowing.  The history is provided by the patient. No language interpreter was used.   Past Medical History:  Diagnosis Date  . Anxiety   . Bipolar disorder (HCC)   . Depression   . PTSD (post-traumatic stress disorder)   . Rheumatoid arthritis(714.0)     Patient Active Problem List   Diagnosis Date Noted  . Depression   . Benzodiazepine dependence (HCC)   . Major depressive disorder, recurrent, severe without psychotic features (HCC)   . Substance induced mood disorder (HCC)  11/05/2014  . Suicidal ideation 11/05/2014  . Anxiety state, unspecified 01/01/2014  . MDD (major depressive disorder), recurrent episode, severe (HCC) 12/31/2013  . Rheumatoid arthritis (HCC) 05/17/2013  . Polysubstance abuse 05/17/2013   History reviewed. No pertinent surgical history.   Home Medications    Prior to Admission medications   Medication Sig Start Date End Date Taking? Authorizing Provider  carbamazepine (TEGRETOL) 200 MG tablet Take 200 mg by mouth 3 (three) times daily.   Yes Historical Provider, MD  citalopram (CELEXA) 40 MG tablet Take 1 tablet (40 mg total) by mouth daily. 04/24/15  Yes Nelva Nay, MD  gabapentin (NEURONTIN) 400 MG capsule Take 1 capsule (400 mg total) by mouth 3 (three) times daily. 02/17/16  Yes Jessica L Focht, PA  hydrOXYzine (ATARAX/VISTARIL) 50 MG tablet Take 1 tablet (50 mg total) by mouth every 6 (six) hours as needed for anxiety. 04/24/15  Yes Fuller Plan, MD  pantoprazole (PROTONIX) 20 MG tablet Take 1 tablet (20 mg total) by mouth daily. 02/17/16  Yes Jerre Simon, PA  promethazine (PHENERGAN) 25 MG tablet Take 12.5 mg by mouth every 6 (six) hours as needed for nausea/vomiting. 02/18/16  Yes Historical Provider, MD  QUEtiapine (SEROQUEL) 300 MG tablet Take 1 tablet (300 mg total) by mouth at bedtime. Patient taking differently: Take 150-300 mg by mouth 2 (two) times daily. Takes 150mg  in am and 300mg  in pm 04/24/15  Yes , MD  promethazine (PHENERGAN) 12.5 MG tablet Take 1 tablet (  12.5 mg total) by mouth every 6 (six) hours as needed for nausea or vomiting. Patient not taking: Reported on 03/27/2016 02/17/16   Jerre Simon, PA    Family History History reviewed. No pertinent family history.  Social History Social History  Substance Use Topics  . Smoking status: Current Every Day Smoker    Packs/day: 0.50    Years: 10.00    Types: Cigarettes  . Smokeless tobacco: Not on file  . Alcohol use Yes     Comment:  occasionally     Allergies   Clindamycin/lincomycin   Review of Systems Review of Systems  Constitutional: Negative for fever.  HENT: Negative for trouble swallowing.   Skin: Positive for rash.  Psychiatric/Behavioral: Positive for hallucinations and suicidal ideas.  All other systems reviewed and are negative.  Physical Exam Updated Vital Signs BP 108/73 (BP Location: Left Arm)   Pulse 78   Temp 97.3 F (36.3 C) (Oral)   Resp 20   SpO2 100%   Physical Exam  Constitutional: He is oriented to person, place, and time. He appears well-developed and well-nourished.  HENT:  Head: Normocephalic and atraumatic.  No swelling of uvula, tongue or sublingual tissues. Tolerating secretions well.   Eyes: EOM are normal. Pupils are equal, round, and reactive to light.  Neck: Normal range of motion. Neck supple. No JVD present.  Cardiovascular: Normal rate, regular rhythm and normal heart sounds.   No murmur heard. Pulmonary/Chest: Effort normal and breath sounds normal. He has no wheezes. He has no rales. He exhibits no tenderness.  Abdominal: Soft. He exhibits no distension and no mass. There is no tenderness.  Musculoskeletal: Normal range of motion. He exhibits no edema.  Lymphadenopathy:    He has no cervical adenopathy.  Neurological: He is alert and oriented to person, place, and time. No cranial nerve deficit. He exhibits normal muscle tone. Coordination normal.  Skin: Skin is warm and dry. Rash noted. There is erythema.  Multiple raised erythematous areas not typically urticarial. Some appear excoriated.   Psychiatric: His behavior is normal. Judgment and thought content normal.  Depressed affect. Moderate to severe psychomotor retardation  Nursing note and vitals reviewed.    ED Treatments / Results  DIAGNOSTIC STUDIES: Oxygen Saturation is 100% on RA, normal by my interpretation.    COORDINATION OF CARE: 12:57 AM Discussed treatment plan with pt at bedside which  includes transferring to psychiatric hold area and pt agreed to plan.  Labs (all labs ordered are listed, but only abnormal results are displayed) Labs Reviewed  COMPREHENSIVE METABOLIC PANEL - Abnormal; Notable for the following:       Result Value   Chloride 100 (*)    Albumin 5.2 (*)    ALT 14 (*)    All other components within normal limits  ACETAMINOPHEN LEVEL - Abnormal; Notable for the following:    Acetaminophen (Tylenol), Serum <10 (*)    All other components within normal limits  URINE RAPID DRUG SCREEN, HOSP PERFORMED - Abnormal; Notable for the following:    Opiates POSITIVE (*)    Cocaine POSITIVE (*)    Benzodiazepines POSITIVE (*)    Tetrahydrocannabinol POSITIVE (*)    All other components within normal limits  ETHANOL  SALICYLATE LEVEL  CBC    Procedures Procedures (including critical care time)  Medications Ordered in ED Medications  diphenhydrAMINE (BENADRYL) capsule 25 mg (25 mg Oral Not Given 03/27/16 0127)  doxycycline (VIBRA-TABS) tablet 100 mg (100 mg Oral Not Given 03/27/16  0127)  alum & mag hydroxide-simeth (MAALOX/MYLANTA) 200-200-20 MG/5ML suspension 30 mL (not administered)  ondansetron (ZOFRAN) tablet 4 mg (not administered)  nicotine (NICODERM CQ - dosed in mg/24 hr) patch 7 mg (not administered)  zolpidem (AMBIEN) tablet 5 mg (not administered)  ibuprofen (ADVIL,MOTRIN) tablet 600 mg (not administered)  acetaminophen (TYLENOL) tablet 650 mg (not administered)  LORazepam (ATIVAN) tablet 1 mg (not administered)  carbamazepine (TEGRETOL) tablet 200 mg (not administered)  citalopram (CELEXA) tablet 40 mg (not administered)  gabapentin (NEURONTIN) capsule 400 mg (not administered)  pantoprazole (PROTONIX) EC tablet 20 mg (not administered)  QUEtiapine (SEROQUEL) tablet 150-300 mg (150 mg Oral Not Given 03/27/16 0128)  diphenhydrAMINE (BENADRYL) capsule 25 mg (not administered)  doxycycline (VIBRA-TABS) tablet 100 mg (100 mg Oral Not Given 03/27/16 0128)   loratadine (CLARITIN) tablet 10 mg (not administered)     Initial Impression / Assessment and Plan / ED Course  I have reviewed the triage vital signs and the nursing notes.  Pertinent labs & imaging results that were available during my care of the patient were reviewed by me and considered in my medical decision making (see chart for details).  Clinical Course    Major depression with suicidal ideation. Patient also has psychotic features of seeing people in the words and also seeing ticks that are inside of his rash. Rash is nonspecific in appearance and much of what I see is related to excoriations from scratching. However, there does seem to be an underlying skin condition which is nonspecific. He is given diphenhydramine with relief of itching. He is paced in psychiatric holding for consultation with TTS and appropriate placement. Old records are reviewed and he has prior ED visits and hospital admissions for depression with suicidal ideation.  Final Clinical Impressions(s) / ED Diagnoses   Final diagnoses:  Rash  Suicidal ideation  Severe episode of recurrent major depressive disorder, with psychotic features (HCC)    New Prescriptions New Prescriptions   No medications on file  I personally performed the services described in this documentation, which was scribed in my presence. The recorded information has been reviewed and is accurate.        Dione Booze, MD 03/27/16 [phone]

## 2016-03-27 NOTE — ED Notes (Signed)
Patient declined BP check.  Clonidine not given.

## 2016-03-27 NOTE — BH Assessment (Signed)
[facility] Assessment Progress Note  Per Thedore Mins, MD, this pt requires psychiatric hospitalization at this time.  Please note that pt's father is currently a patient at [facility], and that pt therefore cannot be transferred there.  The following facilities have been contacted to seek placement for this pt, with results as noted:  Beds available, information sent, decision pending:  [facility]   At capacity:  [facility] Horatio Pel, [place] Triage Specialist [phone]

## 2016-03-27 NOTE — Progress Notes (Signed)
Pt seen by [facility], Stacy, and provided with resources for uninsured and application

## 2016-03-27 NOTE — [facility] Counselor (Signed)
Attempted to assess pt but pt would not wake up enough to participate.  RN stated he would advise EDP and ask to remove Consult and re-enter when pt is awake and can participate.  Beryle Flock, MS, CRC, [facility] Triage Specialist [facility]

## 2016-03-27 NOTE — BH Assessment (Signed)
Assessment Note  Steven Barker is an 30 y.o. male presenting to [facility] via EMS. Sts that he called EMS due to an allergic reaction stating, "Something was biting me". Patient has a history of Anxiety, Bipolar Disorder, Depression, and PTSD. Sts that he has current symptoms of depression. He describes his depression as loss of interest in usual pleasures, fatigue, irritability, and hopelessness. Appetite and sleep are both poor.  Patient does not identify any stressors. Sts, "I don't know why I feel like this". He reports trying to harm himself via overdosing on Heroin 3x's in the past 1.5 week. Patient stating that his recent suicide attempts were not successful because, "I don't have enough". Patient does not know what triggered his recent and past suicide attempts. Today patient continues to report current suicidal ideations with a plan to overdose. Patient unable to contract for safety today. Patient has a family history of mental illness (father).   No HI. Patient irritable but cooperative with this Clinical research associate. He has current legal issues assault on a male and trespassing. Court dates are scheduled for April 02, 2016 and May 28, 2016. No AVH's. He reports polysubstance abuse (Cocaine, THC, Heroin, Alcohol,  Xanax). SEE ADDITIONAL SOCIAL HISTORY for details related to patient's substance use. Patient admitted to [facility] multiple times in the past. He also received INPT mental health treatment at [facility]  Patient is dressed in scrubs. Speech normal. Mood is Irritable, depressed, and restless.   Diagnosis: Bipolar Disorder, Depression, Anxiety, PTSD  Past Medical History:  Past Medical History:  Diagnosis Date  . Anxiety   . Bipolar disorder (HCC)   . Depression   . PTSD (post-traumatic stress disorder)   . Rheumatoid arthritis(714.0)     History reviewed. No pertinent surgical history.  Family History: History reviewed. No pertinent family history.  Social History:  reports that he  has been smoking Cigarettes.  He has a 5.00 pack-year smoking history. He does not have any smokeless tobacco history on file. He reports that he drinks alcohol. He reports that he uses drugs, including Marijuana, Cocaine, Other-see comments, and Heroin.  Additional Social History:  Alcohol / Drug Use Pain Medications: SEE MAR Prescriptions: SEE MAR Over the Counter: SEE MAR History of alcohol / drug use?: Yes Longest period of sobriety (when/how long): None  Negative Consequences of Use: Work / Programmer, multimedia, Copywriter, advertising relationships, Armed forces operational officer, Surveyor, quantity Withdrawal Symptoms: Irritability, Agitation Substance #1 Name of Substance 1: Heroin  1 - Age of First Use: 30 yrs old 1 - Amount (size/oz): 1/2 gram- 1 gram  1 - Frequency: daily for the past 2 yrs  1 - Duration: on-going  1 - Last Use / Amount: "Yesterday morning" Substance #2 Name of Substance 2: Cocaine  2 - Age of First Use: 30 yrs old  2 - Amount (size/oz): "As much as I can get"; approx. .5 grams 2 - Frequency: 2x's weekly  2 - Duration: on-going  2 - Last Use / Amount: 2 days ago  Substance #3 Name of Substance 3: Benzodiazepine; Xanax 3 - Age of First Use: 30 yrs old 3 - Amount (size/oz): "I don't know" 3 - Frequency: "I rarely use it" 3 - Duration: on-going  3 - Last Use / Amount: "I can't remember" Substance #4 Name of Substance 4: Alcohol 4 - Age of First Use: 17 4 - Amount (size/oz): "I don't drink much" 4 - Frequency: "I don't drink much" 4 - Duration: ongoing 4 - Last Use / Amount: "I can't  remember" Substance #5 Name of Substance 5: THC 5 - Age of First Use: 14 5 - Amount (size/oz): 1/4 gram 5 - Frequency: daily 5 - Duration: ongoing 5 - Last Use / Amount: "couple days ago"  CIWA: CIWA-Ar BP: 108/73 Pulse Rate: 78 COWS:    Allergies:  Allergies  Allergen Reactions  . Clindamycin/Lincomycin Nausea And Vomiting    Patient stated that he felt like he was going to die    Home Medications:  (Not in a  hospital admission)  OB/GYN Status:  No LMP for male patient.  General Assessment Data Location of Assessment: [facility] TTS Assessment: In system Is this a Tele or Face-to-Face Assessment?: Face-to-Face Is this an Initial Assessment or a Re-assessment for this encounter?: Initial Assessment Marital status: Single Maiden name:  (n/a) Is patient pregnant?: No Pregnancy Status: No Living Arrangements: Parent, Other relatives Can pt return to current living arrangement?: No Admission Status: Voluntary Is patient capable of signing voluntary admission?: No Referral Source: Self/Family/Friend Insurance type:  (Med Pay )  Medical Screening Exam [facility] Walk-in ONLY) Medical Exam completed: No  Crisis Care Plan Living Arrangements: Parent, Other relatives Legal Guardian: Other: (no legal guardian ) Name of Psychiatrist:  Museum/gallery curator ) Name of Therapist:  Museum/gallery curator )  Education Status Is patient currently in school?: No Current Grade:  (n/a) Highest grade of school patient has completed:  (n/a) Name of school:  (n/a) Contact person:  (n/a)  Risk to self with the past 6 months Suicidal Ideation: Yes-Currently Present Has patient been a risk to self within the past 6 months prior to admission? : Yes Suicidal Intent: Yes-Currently Present Has patient had any suicidal intent within the past 6 months prior to admission? : Yes Is patient at risk for suicide?: Yes Suicidal Plan?: Yes-Currently Present Has patient had any suicidal plan within the past 6 months prior to admission? : Yes Specify Current Suicidal Plan:  (overdose on Heroin ) Access to Means: Yes (Heroin ) Specify Access to Suicidal Means:  (Heroin ) What has been your use of drugs/alcohol within the last 12 months?:  (Cocaine, THC, Heroin, Xanax) Previous Attempts/Gestures: Yes How many times?:  (3x's in the past 1.5 week ) Other Self Harm Risks:  (patient denies ) Triggers for Past Attempts: Other (Comment) ("I don't  know") Intentional Self Injurious Behavior: None Family Suicide History: Yes (Father-mental illness) Recent stressful life event(s): Other (Comment) (patient does not  identify any stressors) Persecutory voices/beliefs?: No Depression: Yes Depression Symptoms: Feeling angry/irritable, Loss of interest in usual pleasures, Fatigue, Isolating Substance abuse history and/or treatment for substance abuse?: Yes Suicide prevention information given to non-admitted patients: Not applicable  Risk to Others within the past 6 months Homicidal Ideation: No Does patient have any lifetime risk of violence toward others beyond the six months prior to admission? : No Thoughts of Harm to Others: No Current Homicidal Intent: No Current Homicidal Plan: No Access to Homicidal Means: No Identified Victim:  (n/) History of harm to others?: No Assessment of Violence: None Noted Violent Behavior Description:  (patient is calm and cooperative ) Does patient have access to weapons?: No Criminal Charges Pending?: Yes Describe Pending Criminal Charges:  (tresspassing and assault on a male) Does patient have a court date: Yes (April 02, 2016 and May 28, 2016) Court Date:  (n/a) Is patient on probation?: No  Psychosis Hallucinations: None noted Delusions: None noted  Mental Status Report Appearance/Hygiene: In scrubs Eye Contact: Poor Motor Activity: Agitation, Freedom of movement,  Restlessness Speech: Aggressive, Argumentative Level of Consciousness: Alert, Irritable Mood: Depressed, Irritable, Preoccupied Affect: Appropriate to circumstance Anxiety Level: Minimal Thought Processes: Coherent, Relevant Judgement: Impaired Orientation: Person, Place, Time, Situation Obsessive Compulsive Thoughts/Behaviors: None  Cognitive Functioning Concentration: Decreased Memory: Remote Intact, Recent Intact IQ: Average Insight: Poor Impulse Control: Poor Appetite: Poor Weight Loss:  (n/a) Weight Gain:   (n/a) Sleep: Decreased Total Hours of Sleep:  ("I haven't sleep well in several days") Vegetative Symptoms: None  ADLScreening [facility] Assessment Services) Patient's cognitive ability adequate to safely complete daily activities?: Yes Patient able to express need for assistance with ADLs?: Yes Independently performs ADLs?: Yes (appropriate for developmental age)  Prior Inpatient Therapy Prior Inpatient Therapy: Yes Prior Therapy Dates:  ([facility]-"several times")  Prior Outpatient Therapy Prior Outpatient Therapy: Yes Prior Therapy Dates:  ([facility]-current ) Prior Therapy Facilty/Provider(s):  Museum/gallery curator ) Reason for Treatment:  (medication managment ) Does patient have an ACCT team?: No Does patient have Intensive In-House Services?  : No Does patient have [facility] services? : No Does patient have P4[facility] services?: No  ADL Screening (condition at time of admission) Patient's cognitive ability adequate to safely complete daily activities?: Yes Is the patient deaf or have difficulty hearing?: No Does the patient have difficulty seeing, even when wearing glasses/contacts?: No Does the patient have difficulty concentrating, remembering, or making decisions?: No Patient able to express need for assistance with ADLs?: Yes Does the patient have difficulty dressing or bathing?: No Independently performs ADLs?: Yes (appropriate for developmental age) Does the patient have difficulty walking or climbing stairs?: No Weakness of Legs: None Weakness of Arms/Hands: None  Home Assistive Devices/Equipment Home Assistive Devices/Equipment: None    Abuse/Neglect Assessment (Assessment to be complete while patient is alone) Physical Abuse: Denies Verbal Abuse: Denies Sexual Abuse: Denies Exploitation of patient/patient's resources: Denies Self-Neglect: Denies Possible abuse reported to:: [place] department of social services Values / Beliefs Cultural Requests During Hospitalization:  None Consults Spiritual Care Consult Needed: No Social Work Consult Needed: No Merchant navy officer (For Healthcare) Does patient have an advance directive?: No Would patient like information on creating an advanced directive?: No - patient declined information Nutrition Screen- [facility] Patient's home diet: Regular  Additional Information 1:1 In Past 12 Months?: No CIRT Risk: No Elopement Risk: No Does patient have medical clearance?: Yes     Disposition:  Disposition Initial Assessment Completed for this Encounter: Yes Disposition of Patient: Other dispositions (Remain in ED overnight; Re-evaluated in the am per Dr. Darrel Reach) Other disposition(s): Other (Comment) (Per Dr. Jannifer Franklin pending am re-eval; stay in ED overnight )  On Site Evaluation by:   Reviewed with Physician: Dr. Jannifer Franklin and Nanine Means, DNP   Melynda Ripple [facility] 03/27/2016 10:49 AM

## 2016-03-27 NOTE — ED Notes (Signed)
Patient present to ED for suicidal ideation and reported to triage nurse that he tried to over dose 3 times this week. However, patient refuses to speak to this Clinical research associate during assessment. Encouragement and support provided and safety maintain. Q 15 min safety checks in place and video monitoring.

## 2016-03-27 NOTE — ED Notes (Addendum)
States he no longer wants tegretol.  Support offered.

## 2016-03-27 NOTE — ED Notes (Signed)
Patient admits to [facility] with no plan at this time. Patient denies HI and AVH. Patient is irritable at this time. Encouragement and support provided and safety maintain. Q 15 min safety checks remain in place and video monitoring.

## 2016-03-27 NOTE — ED Notes (Signed)
Eilan has been irritable and labile this a.m. He endorsed passive SI as well as HI (toward Nanine Means NP -- he could not cite specifics -- seemed aggrieved over an interaction minutes earlier). During med administration, it was difficult to get him to focus on task at hand and answer questions regarding medications. Questions were answered with statements of blame directed at others. Later, he would not allow vitals to be taken so that clonidine could be administered. Patient remains free from harm. Will continue to monitor for needs/safety.

## 2016-03-27 NOTE — Progress Notes (Signed)
Call received from [place] with [facility] who states she was unable to review patient's information. Information was resent.   Steven Barker [phone] ED CSW 03/27/2016 5:02 PM

## 2016-03-27 NOTE — ED Notes (Signed)
C/O nausea. Zofran 4mg  requested and received.

## 2016-03-27 NOTE — Progress Notes (Signed)
Call received from [place] with [facility] who states patient declined due to substance abuse and acuity.   Elenore Paddy [phone] ED CSW 03/27/2016 6:14 PM

## 2016-03-27 NOTE — ED Notes (Signed)
Chaplain following for potential support needs.    Pt's father currently inpatient on [address] at [facility].  Pt's grandmother recently released from [facility].      This chaplain familiar with Cristiano from previous admissions.  Pt has previously expressed caring for grandmother's long-term illness as stressor.  Pt's mother died of cancer 10 years ago.      Belva Crome MDiv

## 2016-03-27 NOTE — ED Notes (Signed)
Patient refused vitals Rn Rashell was notified of refusal

## 2016-03-28 DIAGNOSIS — F332 Major depressive disorder, recurrent severe without psychotic features: Secondary | ICD-10-CM | POA: Diagnosis not present

## 2016-03-28 MED ORDER — DOXYCYCLINE HYCLATE 100 MG PO TABS: 100.0000 mg | ORAL_TABLET | Freq: Two times a day (BID) | ORAL | 0 refills | Status: AC

## 2016-03-28 NOTE — ED Notes (Signed)
Pt became agitated, irritable, and aggressive to property on the unit when this nurse informed him he was being discharged home. Yelling "These doctors here aren't worth a flying fuck." Pt continues to scream profanity. Pt tore telephone off the wall and then walked back to room #41.

## 2016-03-28 NOTE — ED Notes (Signed)
Pt discharged home per MD order. This nurse reviewed discharge summary with pt. Pt verbalizes understanding of discharge summary. RX given. Pt denies SI/HI. Pt signed for personal property. Pt signed e-signature. Ambulatory off unit.

## 2016-03-28 NOTE — [facility] Suicide Risk Assessment (Signed)
Suicide Risk Assessment  Discharge Assessment   [facility] Discharge Suicide Risk Assessment   Principal Problem: major depressive disorder, recurrent, mild Discharge Diagnoses:  Patient Active Problem List   Diagnosis Date Noted  . Major depressive disorder, recurrent, severe without psychotic features (HCC) [F33.2]     Priority: High  . Substance induced mood disorder (HCC) [F19.94] 11/05/2014    Priority: High  . Suicidal ideation [R45.851] 11/05/2014    Priority: High  . Polysubstance abuse [F19.10] 05/17/2013    Priority: High  . Depression [F32.9]   . Benzodiazepine dependence (HCC) [F13.20]   . Anxiety state, unspecified [F41.1] 01/01/2014  . MDD (major depressive disorder), recurrent episode, severe (HCC) [F33.2] 12/31/2013  . Rheumatoid arthritis (HCC) [M06.9] 05/17/2013    Total Time spent with patient: 45 minutes  Musculoskeletal: Strength & Muscle Tone: within normal limits Gait & Station: normal Patient leans: N/A  Psychiatric Specialty Exam: Physical Exam  Constitutional: He is oriented to person, place, and time. He appears well-developed and well-nourished.  HENT:  Head: Normocephalic.  Neck: Normal range of motion.  Respiratory: Effort normal.  Musculoskeletal: Normal range of motion.  Neurological: He is alert and oriented to person, place, and time.  Skin: Skin is warm and dry.  Psychiatric: His speech is normal and behavior is normal. Judgment and thought content normal. Cognition and memory are normal. He exhibits a depressed mood.    Review of Systems  Constitutional: Negative.   HENT: Negative.   Eyes: Negative.   Respiratory: Negative.   Cardiovascular: Negative.   Gastrointestinal: Negative.   Genitourinary: Negative.   Musculoskeletal: Negative.   Skin: Negative.   Neurological: Negative.   Endo/Heme/Allergies: Negative.   Psychiatric/Behavioral: Positive for depression, substance abuse and suicidal ideas.    Blood pressure 106/63, pulse (!)  55, temperature 97.9 F (36.6 C), temperature source Oral, resp. rate 18, SpO2 96 %.There is no height or weight on file to calculate BMI.  General Appearance: Casual  Eye Contact:  Good  Speech:  Normal Rate  Volume:  Normal  Mood:  Depressed, mild  Affect:  Congruent  Thought Process:  Coherent and Descriptions of Associations: Intact  Orientation:  Full (Time, Place, and Person)  Thought Content:  WDL  Suicidal Thoughts:  No  Homicidal Thoughts:  No  Memory:  Immediate;   Good Recent;   Good Remote;   Good  Judgement:  Fair  Insight:  Fair  Psychomotor Activity:  Normal  Concentration:  Concentration: Good and Attention Span: Good  Recall:  Good  Fund of Knowledge:  Fair  Language:  Good  Akathisia:  No  Handed:  Right  AIMS (if indicated):     Assets:  Housing Leisure Time Physical Health Resilience Social Support  ADL's:  Intact  Cognition:  WNL  Sleep:      Mental Status Per Nursing Assessment::   On Admission:   polysubstance abuse with suicidal ideations  Demographic Factors:  Male, Adolescent or young adult and Caucasian  Loss Factors: NA  Historical Factors: Prior suicide attempts and Family history of mental illness or substance abuse  Risk Reduction Factors:   Sense of responsibility to family, Employed, Living with another person, especially a relative and Positive social support  Continued Clinical Symptoms:  Depression, mild  Cognitive Features That Contribute To Risk:  None    Suicide Risk:  Minimal: No identifiable suicidal ideation.  Patients presenting with no risk factors but with morbid ruminations; may be classified as minimal risk based on the  severity of the depressive symptoms    Plan Of Care/Follow-up recommendations:  Activity:  as tolerated Diet:  heart healthy diet  LORD, JAMISON, NP 03/28/2016, 8:54 AM

## 2016-03-28 NOTE — Consult Note (Signed)
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 Major depressive disorder, recurrent, severe without psychotic features ([place]) [F33.2]     Priority: High  . Substance induced mood disorder ([place]) [F19.94] 11/05/2014    Priority: High  . Suicidal ideation [R45.851] 11/05/2014    Priority: High  . Polysubstance abuse [F19.10] 05/17/2013    Priority: High  . Depression [F32.9]   . Benzodiazepine dependence ([place]) [F13.20]   . Anxiety state, unspecified [F41.1] 01/01/2014  . MDD (major depressive disorder), recurrent episode, severe ([place]) [F33.2] 12/31/2013  . Rheumatoid arthritis ([place]) [M06.9] 05/17/2013    Total Time spent with patient: 45 minutes  Subjective:   [name] is a 30 y.o. male patient does not warrant admission.  HPI:  29 yo male who presented to the ED under the influence of multiple street drugs with suicidal ideations.  Later yesterday, he declared he wanted to leave as he was no longer suicidal and needed to leave to take care of his grandmother and secure his living area.  He was kept over night to assure safety.  This morning he told the TTS worker he is not suicidal and wants to leave but requested he could sleep "in" until lunch.  No suicidal/homicidal ideations, hallucinations, no withdrawal symptoms.  Past Psychiatric History: polysubstance abuse, depression  Risk to Self: Suicidal Ideation: Yes-Currently Present Suicidal Intent: Yes-Currently Present Is patient at risk for suicide?: Yes Suicidal Plan?: Yes-Currently Present Specify Current Suicidal Plan:  (overdose on Heroin ) Access to Means: Yes (Heroin ) Specify Access to Suicidal Means:  (Heroin ) What has  been your use of drugs/alcohol within the last 12 months?:  (Cocaine, THC, Heroin, Xanax) How many times?:  (3x's in the past 1.5 week ) Other Self Harm Risks:  (patient denies ) Triggers for Past Attempts: Other (Comment) ("I don't know") Intentional Self Injurious Behavior: None Risk to Others: Homicidal Ideation: No Thoughts of Harm to Others: No Current Homicidal Intent: No Current Homicidal Plan: No Access to Homicidal Means: No Identified Victim:  (n/) History of harm to others?: No Assessment of Violence: None Noted Violent Behavior Description:  (patient is calm and cooperative ) Does patient have access to weapons?: No Criminal Charges Pending?: Yes Describe Pending Criminal Charges:  (tresspassing and assault on a male) Does patient have a court date: Yes (April 02, 2016 and May 28, 2016) Court Date:  (n/a) Prior Inpatient Therapy: Prior Inpatient Therapy: Yes Prior Therapy Dates:  ([facility]-"several times") Prior Outpatient Therapy: Prior Outpatient Therapy: Yes Prior Therapy Dates:  ([facility]-current ) Prior Therapy Facilty/Provider(s):  Consulting civil engineer ) Reason for Treatment:  (medication managment ) Does patient have an ACCT team?: No Does patient have Intensive In-House Services?  : No Does patient have [facility] services? : No Does patient have [facility] services?: No  Past Medical History:  Past Medical History:  Diagnosis Date  . Anxiety   . Bipolar disorder ([place])   . Depression   . PTSD (post-traumatic stress disorder)   . Rheumatoid arthritis(714.0)    History reviewed. No pertinent surgical history. Family History: History reviewed. No pertinent family history. Family Psychiatric  History: father with substance abuse and depression Social History:  History  Alcohol Use  . Yes    Comment: occasionally  History  Drug Use  . Types: Marijuana, Cocaine, Other-see comments, Heroin    Comment: heroin    Social History   Social History  . Marital status: Single     Spouse name: N/A  . Number of children: N/A  . Years of education: N/A   Social History Main Topics  . Smoking status: Current Every Day Smoker    Packs/day: 0.50    Years: 10.00    Types: Cigarettes  . Smokeless tobacco: None  . Alcohol use Yes     Comment: occasionally  . Drug use:     Types: Marijuana, Cocaine, Other-see comments, Heroin     Comment: heroin  . Sexual activity: Yes    Birth control/ protection: None   Other Topics Concern  . None   Social History Narrative  . None   Additional Social History:    Allergies:   Allergies  Allergen Reactions  . Clindamycin/Lincomycin Nausea And Vomiting    Patient stated that he felt like he was going to die    Labs:  Results for orders placed or performed during the hospital encounter of 03/26/16 (from the past 48 hour(s))  Rapid urine drug screen (hospital performed)     Status: Abnormal   Collection Time: 03/27/16 12:07 AM  Result Value Ref Range   Opiates POSITIVE (A) NONE DETECTED   Cocaine POSITIVE (A) NONE DETECTED   Benzodiazepines POSITIVE (A) NONE DETECTED   Amphetamines NONE DETECTED NONE DETECTED   Tetrahydrocannabinol POSITIVE (A) NONE DETECTED   Barbiturates NONE DETECTED NONE DETECTED    Comment:        DRUG SCREEN FOR MEDICAL PURPOSES ONLY.  IF CONFIRMATION IS NEEDED FOR ANY PURPOSE, NOTIFY LAB WITHIN 5 DAYS.        LOWEST DETECTABLE LIMITS FOR URINE DRUG SCREEN Drug Class       Cutoff (ng/mL) Amphetamine      1000 Barbiturate      200 Benzodiazepine   010 Tricyclics       272 Opiates          300 Cocaine          300 THC              50   Comprehensive metabolic panel     Status: Abnormal   Collection Time: 03/27/16 12:23 AM  Result Value Ref Range   Sodium 135 135 - 145 mmol/L   Potassium 4.0 3.5 - 5.1 mmol/L   Chloride 100 (L) 101 - 111 mmol/L   CO2 28 22 - 32 mmol/L   Glucose, Bld 81 65 - 99 mg/dL   BUN 14 6 - 20 mg/dL   Creatinine, Ser 1.05 0.61 - 1.24 mg/dL   Calcium 9.6  8.9 - 10.3 mg/dL   Total Protein 7.8 6.5 - 8.1 g/dL   Albumin 5.2 (H) 3.5 - 5.0 g/dL   AST 23 15 - 41 U/L   ALT 14 (L) 17 - 63 U/L   Alkaline Phosphatase 62 38 - 126 U/L   Total Bilirubin 1.2 0.3 - 1.2 mg/dL   GFR calc non Af Amer >60 >60 mL/min   GFR calc Af Amer >60 >60 mL/min    Comment: (NOTE) The eGFR has been calculated using the CKD EPI equation. This calculation has not been validated in all clinical situations. eGFR's persistently <60 mL/min signify possible Chronic Kidney Disease.    Anion gap 7 5 - 15  Ethanol     Status: None  Collection Time: 03/27/16 12:23 AM  Result Value Ref Range   Alcohol, Ethyl (B) <5 <5 mg/dL    Comment:        LOWEST DETECTABLE LIMIT FOR SERUM ALCOHOL IS 5 mg/dL FOR MEDICAL PURPOSES ONLY   Salicylate level     Status: None   Collection Time: 03/27/16 12:23 AM  Result Value Ref Range   Salicylate Lvl <8.4 2.8 - 30.0 mg/dL  Acetaminophen level     Status: Abnormal   Collection Time: 03/27/16 12:23 AM  Result Value Ref Range   Acetaminophen (Tylenol), Serum <10 (L) 10 - 30 ug/mL    Comment:        THERAPEUTIC CONCENTRATIONS VARY SIGNIFICANTLY. A RANGE OF 10-30 ug/mL MAY BE AN EFFECTIVE CONCENTRATION FOR MANY PATIENTS. HOWEVER, SOME ARE BEST TREATED AT CONCENTRATIONS OUTSIDE THIS RANGE. ACETAMINOPHEN CONCENTRATIONS >150 ug/mL AT 4 HOURS AFTER INGESTION AND >50 ug/mL AT 12 HOURS AFTER INGESTION ARE OFTEN ASSOCIATED WITH TOXIC REACTIONS.   cbc     Status: None   Collection Time: 03/27/16 12:23 AM  Result Value Ref Range   WBC 8.0 4.0 - 10.5 K/uL   RBC 4.44 4.22 - 5.81 MIL/uL   Hemoglobin 13.9 13.0 - 17.0 g/dL   HCT 40.5 39.0 - 52.0 %   MCV 91.2 78.0 - 100.0 fL   MCH 31.3 26.0 - 34.0 pg   MCHC 34.3 30.0 - 36.0 g/dL   RDW 13.3 11.5 - 15.5 %   Platelets 241 150 - 400 K/uL    Current Facility-Administered Medications  Medication Dose Route Frequency Provider Last Rate Last Dose  . acetaminophen (TYLENOL) tablet 650 mg  650  mg Oral X2K PRN Delora Fuel, MD      . alum & mag hydroxide-simeth (MAALOX/MYLANTA) 200-200-20 MG/5ML suspension 30 mL  30 mL Oral PRN Delora Fuel, MD      . carbamazepine (TEGRETOL) tablet 200 mg  200 mg Oral TID Delora Fuel, MD   208 mg at 03/27/16 1632  . citalopram (CELEXA) tablet 40 mg  40 mg Oral Daily Delora Fuel, MD   40 mg at 13/88/71 1008  . cloNIDine (CATAPRES) tablet 0.1 mg  0.1 mg Oral QID Patrecia Pour, NP   0.1 mg at 03/27/16 2113   Followed by  . [START ON 03/29/2016] cloNIDine (CATAPRES) tablet 0.1 mg  0.1 mg Oral BH-qamhs Patrecia Pour, NP       Followed by  . [START ON 03/31/2016] cloNIDine (CATAPRES) tablet 0.1 mg  0.1 mg Oral QAC breakfast Patrecia Pour, NP      . dicyclomine (BENTYL) tablet 20 mg  20 mg Oral Q6H PRN Patrecia Pour, NP      . doxycycline (VIBRA-TABS) tablet 100 mg  100 mg Oral Once Delora Fuel, MD      . doxycycline (VIBRA-TABS) tablet 100 mg  100 mg Oral BID Delora Fuel, MD   959 mg at 03/27/16 2113  . hydrOXYzine (ATARAX/VISTARIL) tablet 25 mg  25 mg Oral Q6H PRN Patrecia Pour, NP   25 mg at 03/27/16 1632  . ibuprofen (ADVIL,MOTRIN) tablet 600 mg  600 mg Oral D4X PRN Delora Fuel, MD      . loperamide (IMODIUM) capsule 2-4 mg  2-4 mg Oral PRN Patrecia Pour, NP      . loratadine (CLARITIN) tablet 10 mg  10 mg Oral Daily Delora Fuel, MD   10 mg at 18/55/01 1009  . methocarbamol (ROBAXIN) tablet 500 mg  500 mg  Oral Q8H PRN Patrecia Pour, NP   500 mg at 03/27/16 1632  . naproxen (NAPROSYN) tablet 500 mg  500 mg Oral BID PRN Patrecia Pour, NP      . nicotine (NICODERM CQ - dosed in mg/24 hr) patch 7 mg  7 mg Transdermal Daily Delora Fuel, MD   7 mg at 00/76/22 1013  . ondansetron (ZOFRAN) tablet 4 mg  4 mg Oral Q3F PRN Delora Fuel, MD      . ondansetron (ZOFRAN-ODT) disintegrating tablet 4 mg  4 mg Oral Q6H PRN Patrecia Pour, NP   4 mg at 03/27/16 1753  . pantoprazole (PROTONIX) EC tablet 20 mg  20 mg Oral Daily Delora Fuel, MD   20 mg at 35/45/62 1006    Current Outpatient Prescriptions  Medication Sig Dispense Refill  . carbamazepine (TEGRETOL) 200 MG tablet Take 200 mg by mouth 3 (three) times daily.    . citalopram (CELEXA) 40 MG tablet Take 1 tablet (40 mg total) by mouth daily. 30 tablet 0  . gabapentin (NEURONTIN) 400 MG capsule Take 1 capsule (400 mg total) by mouth 3 (three) times daily. 12 capsule 0  . hydrOXYzine (ATARAX/VISTARIL) 50 MG tablet Take 1 tablet (50 mg total) by mouth every 6 (six) hours as needed for anxiety. 30 tablet 0  . pantoprazole (PROTONIX) 20 MG tablet Take 1 tablet (20 mg total) by mouth daily. 30 tablet 0  . promethazine (PHENERGAN) 25 MG tablet Take 12.5 mg by mouth every 6 (six) hours as needed for nausea/vomiting.  0  . QUEtiapine (SEROQUEL) 300 MG tablet Take 1 tablet (300 mg total) by mouth at bedtime. (Patient taking differently: Take 150-300 mg by mouth 2 (two) times daily. Takes 131m in am and 3089min pm) 30 tablet 0  . promethazine (PHENERGAN) 12.5 MG tablet Take 1 tablet (12.5 mg total) by mouth every 6 (six) hours as needed for nausea or vomiting. (Patient not taking: Reported on 03/27/2016) 30 tablet 0    Musculoskeletal: Strength & Muscle Tone: within normal limits Gait & Station: normal Patient leans: N/A  Psychiatric Specialty Exam: Physical Exam  Constitutional: He is oriented to person, place, and time. He appears well-developed and well-nourished.  HENT:  Head: Normocephalic.  Neck: Normal range of motion.  Respiratory: Effort normal.  Musculoskeletal: Normal range of motion.  Neurological: He is alert and oriented to person, place, and time.  Skin: Skin is warm and dry.  Psychiatric: His speech is normal and behavior is normal. Judgment and thought content normal. Cognition and memory are normal. He exhibits a depressed mood.    Review of Systems  Constitutional: Negative.   HENT: Negative.   Eyes: Negative.   Respiratory: Negative.   Cardiovascular: Negative.    Gastrointestinal: Negative.   Genitourinary: Negative.   Musculoskeletal: Negative.   Skin: Negative.   Neurological: Negative.   Endo/Heme/Allergies: Negative.   Psychiatric/Behavioral: Positive for depression, substance abuse and suicidal ideas.    Blood pressure 106/63, pulse (!) 55, temperature 97.9 F (36.6 C), temperature source Oral, resp. rate 18, SpO2 96 %.There is no height or weight on file to calculate BMI.  General Appearance: Casual  Eye Contact:  Good  Speech:  Normal Rate  Volume:  Normal  Mood:  Depressed, mild  Affect:  Congruent  Thought Process:  Coherent and Descriptions of Associations: Intact  Orientation:  Full (Time, Place, and Person)  Thought Content:  WDL  Suicidal Thoughts:  No  Homicidal Thoughts:  No  Memory:  Immediate;   Good Recent;   Good Remote;   Good  Judgement:  Fair  Insight:  Fair  Psychomotor Activity:  Normal  Concentration:  Concentration: Good and Attention Span: Good  Recall:  Good  Fund of Knowledge:  Fair  Language:  Good  Akathisia:  No  Handed:  Right  AIMS (if indicated):     Assets:  Housing Leisure Time Physical Health Resilience Social Support  ADL's:  Intact  Cognition:  WNL  Sleep:        Treatment Plan Summary: Daily contact with patient to assess and evaluate symptoms and progress in treatment, Medication management and Plan major depressive disorder, recurrent, minor without psychotic features:  -Crisis stabilization -Medication management: Opiate Withdrawal Protocol started along with his Celexa 40 mg daily for depression and Tegretol 200 mg TID for mood stabilization.  His Seroquel was not continued due to him being so drowsy. -Individual and substance abuse counseling  Disposition: No evidence of imminent risk to self or others at present.    Waylan Boga, NP 03/28/2016 8:49 AM

## 2016-05-07 ENCOUNTER — Encounter (HOSPITAL_COMMUNITY): Payer: Self-pay | Admitting: Emergency Medicine

## 2016-05-07 ENCOUNTER — Emergency Department (HOSPITAL_COMMUNITY)
Admission: EM | Admit: 2016-05-07 | Discharge: 2016-05-08 | Disposition: A | Discharge status: Home / Self Care | Payer: Federal, State, Local not specified - Other | Attending: Emergency Medicine | Admitting: Emergency Medicine

## 2016-05-07 DIAGNOSIS — F17210 Nicotine dependence, cigarettes, uncomplicated: Secondary | ICD-10-CM | POA: Insufficient documentation

## 2016-05-07 DIAGNOSIS — Z79899 Other long term (current) drug therapy: Secondary | ICD-10-CM | POA: Insufficient documentation

## 2016-05-07 DIAGNOSIS — F332 Major depressive disorder, recurrent severe without psychotic features: Secondary | ICD-10-CM | POA: Insufficient documentation

## 2016-05-07 DIAGNOSIS — R45851 Suicidal ideations: Secondary | ICD-10-CM

## 2016-05-07 LAB — COMPREHENSIVE METABOLIC PANEL
ALT: 44 U/L (ref 17–63)
ANION GAP: 8 (ref 5–15)
AST: 25 U/L (ref 15–41)
Albumin: 4.2 g/dL (ref 3.5–5.0)
Alkaline Phosphatase: 64 U/L (ref 38–126)
BUN: 14 mg/dL (ref 6–20)
CHLORIDE: 101 mmol/L (ref 101–111)
CO2: 26 mmol/L (ref 22–32)
CREATININE: 0.81 mg/dL (ref 0.61–1.24)
Calcium: 9.6 mg/dL (ref 8.9–10.3)
Glucose, Bld: 140 mg/dL — ABNORMAL HIGH (ref 65–99)
POTASSIUM: 3.8 mmol/L (ref 3.5–5.1)
Sodium: 135 mmol/L (ref 135–145)
Total Bilirubin: 0.7 mg/dL (ref 0.3–1.2)
Total Protein: 7.3 g/dL (ref 6.5–8.1)

## 2016-05-07 LAB — CBC WITH DIFFERENTIAL/PLATELET
BASOS ABS: 0 10*3/uL (ref 0.0–0.1)
Basophils Relative: 0 %
Eosinophils Absolute: 0.1 10*3/uL (ref 0.0–0.7)
Eosinophils Relative: 1 %
HEMATOCRIT: 40.5 % (ref 39.0–52.0)
HEMOGLOBIN: 13.5 g/dL (ref 13.0–17.0)
LYMPHS ABS: 1.8 10*3/uL (ref 0.7–4.0)
LYMPHS PCT: 21 %
MCH: 30.9 pg (ref 26.0–34.0)
MCHC: 33.3 g/dL (ref 30.0–36.0)
MCV: 92.7 fL (ref 78.0–100.0)
Monocytes Absolute: 0.7 10*3/uL (ref 0.1–1.0)
Monocytes Relative: 7 %
NEUTROS ABS: 6.3 10*3/uL (ref 1.7–7.7)
NEUTROS PCT: 71 %
PLATELETS: 246 10*3/uL (ref 150–400)
RBC: 4.37 MIL/uL (ref 4.22–5.81)
RDW: 13.9 % (ref 11.5–15.5)
WBC: 8.9 10*3/uL (ref 4.0–10.5)

## 2016-05-07 LAB — ETHANOL

## 2016-05-07 MED ORDER — ZOLPIDEM TARTRATE 5 MG PO TABS
5.0000 mg | ORAL_TABLET | Freq: Every evening | ORAL | Status: DC | PRN
  Filled 2016-05-07: qty 1

## 2016-05-07 MED ORDER — ONDANSETRON HCL 4 MG PO TABS: 4.0000 mg | ORAL_TABLET | Freq: Three times a day (TID) | ORAL | Status: DC | PRN

## 2016-05-07 MED ORDER — STERILE WATER FOR INJECTION IJ SOLN
INTRAMUSCULAR | Status: AC
  Administered 2016-05-07: 0.5 mL
  Filled 2016-05-07: qty 10

## 2016-05-07 MED ORDER — ZIPRASIDONE MESYLATE 20 MG IM SOLR
INTRAMUSCULAR | Status: AC
Start: 2016-05-07 — End: 2016-05-07
  Administered 2016-05-07: 10 mg via INTRAMUSCULAR
  Filled 2016-05-07: qty 20

## 2016-05-07 MED ORDER — ACETAMINOPHEN 325 MG PO TABS: 650.0000 mg | ORAL_TABLET | ORAL | Status: DC | PRN

## 2016-05-07 MED ORDER — IBUPROFEN 200 MG PO TABS: 600.0000 mg | ORAL_TABLET | Freq: Three times a day (TID) | ORAL | Status: DC | PRN

## 2016-05-07 MED ORDER — LORAZEPAM 1 MG PO TABS
1.0000 mg | ORAL_TABLET | Freq: Three times a day (TID) | ORAL | Status: DC | PRN
  Administered 2016-05-07: 1 mg via ORAL
  Filled 2016-05-07: qty 1

## 2016-05-07 MED ORDER — QUETIAPINE FUMARATE 50 MG PO TABS: 150.0000 mg | ORAL_TABLET | Freq: Every evening | ORAL | Status: DC | PRN

## 2016-05-07 MED ORDER — QUETIAPINE FUMARATE 50 MG PO TABS: 150.0000 mg | ORAL_TABLET | Freq: Every day | ORAL | Status: DC

## 2016-05-07 MED ORDER — ZIPRASIDONE MESYLATE 20 MG IM SOLR: 10.0000 mg | Freq: Once | INTRAMUSCULAR | Status: DC

## 2016-05-07 NOTE — ED Notes (Signed)
Pt stated feeling increase depression and reports suicidal ideation without a plan. Pt contracted for safety. Pt reports no stressor. Pt reports using heroin about half gram daily for past 2 years. Pt stated he used 30 mins before coming to ED. Pt appears anxious but denies any other withdrawal symptoms. meal and medication given.

## 2016-05-07 NOTE — ED Provider Notes (Signed)
[facility]-EMERGENCY DEPT Provider Note   CSN: [id number] Arrival date & time: 05/07/16  1544     History   Chief Complaint Chief Complaint  Patient presents with  . Suicidal    HPI Steven Barker is a 30 y.o. male.  HPI  Pt presenting with c/o suicidal ideation.  He was brought in by police- was voluntarily coming to the ED after saying that he felt suicidal.  His family had been concerned about him, and he told police that he felt like he would wanted to kill himself.  Police state that on the drive to the ED patient began saying he did not want to go the ER.  He became very upset and tried to open the car of the moving vehicle to get out.  On arrival to the ED he began yelling and shouting and threatening staff.  He states that he wants to leave.  Per police who are in the ED with him, he did say earlier that he wanting to "fucking kill myself".    Past Medical History:  Diagnosis Date  . Anxiety   . Bipolar disorder ([facility])   . Depression   . PTSD (post-traumatic stress disorder)   . Rheumatoid arthritis(714.0)     Patient Active Problem List   Diagnosis Date Noted  . Depression   . Benzodiazepine dependence ([facility])   . Major depressive disorder, recurrent, severe without psychotic features ([facility])   . Substance induced mood disorder ([facility]) 11/05/2014  . Suicidal ideation 11/05/2014  . Anxiety state, unspecified 01/01/2014  . MDD (major depressive disorder), recurrent episode, severe ([facility]) 12/31/2013  . Rheumatoid arthritis ([facility]) 05/17/2013  . Polysubstance abuse 05/17/2013    History reviewed. No pertinent surgical history.     Home Medications    Prior to Admission medications   Medication Sig Start Date End Date Taking? Authorizing Provider  acetaminophen (TYLENOL) 500 MG tablet Take 1,000 mg by mouth every 6 (six) hours as needed for moderate pain or headache.   Yes Historical Provider, MD  gabapentin (NEURONTIN) 400 MG capsule Take 1 capsule (400 mg total) by mouth 3  (three) times daily. Patient taking differently: Take 400 mg by mouth 4 (four) times daily.  02/17/16  Yes Jerre Simon, PA  promethazine (PHENERGAN) 25 MG tablet Take 12.5 mg by mouth every 6 (six) hours as needed for nausea/vomiting. 02/18/16  Yes Historical Provider, MD  QUEtiapine (SEROQUEL) 300 MG tablet Take 1 tablet (300 mg total) by mouth at bedtime. Patient taking differently: Take 150-300 mg by mouth 2 (two) times daily. Takes 150mg  in am and 300mg  in pm 04/24/15  Yes , MD  citalopram (CELEXA) 40 MG tablet Take 1 tablet (40 mg total) by mouth daily. Patient not taking: Reported on 05/07/2016 04/24/15   07/07/2016, MD  doxycycline (VIBRA-TABS) 100 MG tablet Take 1 tablet (100 mg total) by mouth 2 (two) times daily. Patient not taking: Reported on 05/07/2016 03/28/16   07/07/2016, NP  hydrOXYzine (ATARAX/VISTARIL) 50 MG tablet Take 1 tablet (50 mg total) by mouth every 6 (six) hours as needed for anxiety. Patient not taking: Reported on 05/07/2016 04/24/15   07/07/2016, MD  pantoprazole (PROTONIX) 20 MG tablet Take 1 tablet (20 mg total) by mouth daily. Patient not taking: Reported on 05/07/2016 02/17/16   07/07/2016, PA  promethazine (PHENERGAN) 12.5 MG tablet Take 1 tablet (12.5 mg total) by mouth every 6 (six) hours as needed for nausea or vomiting. Patient not  taking: Reported on 05/07/2016 02/17/16   Jerre Simon, PA    Family History No family history on file.  Social History Social History  Substance Use Topics  . Smoking status: Current Every Day Smoker    Packs/day: 0.50    Years: 10.00    Types: Cigarettes  . Smokeless tobacco: Not on file  . Alcohol use Yes     Comment: occasionally     Allergies   Clindamycin/lincomycin   Review of Systems Review of Systems ROS reviewed and all otherwise negative except for mentioned in HPI  Physical Exam Updated Vital Signs BP 110/62 (BP Location: Right Arm)   Pulse 65   Temp 97.8 F (36.6 C)  (Oral)   Resp 16   SpO2 100%  Vitals reviewed Physical Exam Physical Examination: General appearance - alert, angry appearing, and in no distress Mental status - alert, oriented to person, place, and time Eyes - pupils equal and reactive, extraocular eye movements intact Mouth - mucous membranes moist, pharynx normal without lesions Neck - supple, no significant adenopathy Chest - normal respiratory effort Abdomen - soft, nontender, nondistended, no masses or organomegaly Neurological - alert, oriented, normal speech, no focal findings or movement disorder noted Extremities - peripheral pulses normal, no pedal edema, no clubbing or cyanosis Skin - normal coloration and turgor, no rashes, no suspicious skin lesions noted Psych- screaming yelling, fighting with staff, shouting loudly and crying, not in control of his actions/behaviors  ED Treatments / Results  Labs (all labs ordered are listed, but only abnormal results are displayed) Labs Reviewed  COMPREHENSIVE METABOLIC PANEL - Abnormal; Notable for the following:       Result Value   Glucose, Bld 140 (*)    All other components within normal limits  URINE RAPID DRUG SCREEN, HOSP PERFORMED - Abnormal; Notable for the following:    Cocaine POSITIVE (*)    Tetrahydrocannabinol POSITIVE (*)    All other components within normal limits  ETHANOL  CBC WITH DIFFERENTIAL/PLATELET    EKG  EKG Interpretation None       Radiology No results found.  Procedures Procedures (including critical care time)  Medications Ordered in ED Medications  ziprasidone (GEODON) 20 MG injection (10 mg Intramuscular Given 05/07/16 1604)  sterile water (preservative free) injection (0.5 mLs  Given 05/07/16 1604)   CRITICAL CARE Performed by: Ethelda Chick Total critical care time: 35 minutes Critical care time was exclusive of separately billable procedures and treating other patients. Critical care was necessary to treat or prevent imminent  or life-threatening deterioration. Critical care was time spent personally by me on the following activities: development of treatment plan with patient and/or surrogate as well as nursing, discussions with consultants, evaluation of patient's response to treatment, examination of patient, obtaining history from patient or surrogate, ordering and performing treatments and interventions, ordering and review of laboratory studies, ordering and review of radiographic studies, pulse oximetry and re-evaluation of patient's condition.   Initial Impression / Assessment and Plan / ED Course  I have reviewed the triage vital signs and the nursing notes.  Pertinent labs & imaging results that were available during my care of the patient were reviewed by me and considered in my medical decision making (see chart for details).  Clinical Course  5:18 PM TTS has attempted to evaluate him but he is sedated at this time.  They will see patient later.     Final Clinical Impressions(s) / ED Diagnoses   Final diagnoses:  Suicidal  ideation  Major depressive disorder, recurrent, severe without psychotic features (HCC)   Pt seen and evaluated, he stated he was suicidal to police and wanted to come to the ED.  On the way here he tried to get out of the car.  Upon arrival to the ED pt is screaming and yelling and threatening staff.  He is clearly an imminent danger to himself and others pt sedated with geodon for his own and staff safety.  Will be evaluated by TTS after being medically cleared.   New Prescriptions Discharge Medication List as of 05/08/2016  1:30 PM       Jerelyn Scott, MD 05/11/16 1137

## 2016-05-07 NOTE — ED Triage Notes (Signed)
Pt presents to ED with [employer], originally pt called out because he was suicidal all day. Pt went voluntarily with [employer] then on his way here began making more threats to kill himself and became involuntary. Pt Yelling in triage, balling fists up at [facility], demanding to see the doctor. MD made aware.

## 2016-05-08 DIAGNOSIS — F332 Major depressive disorder, recurrent severe without psychotic features: Secondary | ICD-10-CM

## 2016-05-08 LAB — RAPID URINE DRUG SCREEN, HOSP PERFORMED
AMPHETAMINES: NOT DETECTED
BARBITURATES: NOT DETECTED
Benzodiazepines: NOT DETECTED
COCAINE: POSITIVE — AB
Opiates: NOT DETECTED
TETRAHYDROCANNABINOL: POSITIVE — AB

## 2016-05-08 MED ORDER — QUETIAPINE FUMARATE 50 MG PO TABS
50.0000 mg | ORAL_TABLET | Freq: Every day | ORAL | Status: DC
  Administered 2016-05-08: 50 mg via ORAL
  Filled 2016-05-08: qty 1

## 2016-05-08 MED ORDER — QUETIAPINE FUMARATE 100 MG PO TABS: 200.0000 mg | ORAL_TABLET | Freq: Every day | ORAL | Status: DC

## 2016-05-08 NOTE — ED Notes (Signed)
Pt denied S/I and H/I and demanded discharge.  Lord NP was called to obtain order and IVC was rescinded.  Pt was very irritable.  Discharge instructions were given.  All belongings were returned to patient.  Bus pass was given.
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
## 2016-05-08 NOTE — BH Assessment (Signed)
[facility] Assessment Progress Note  Completed Notice of Commitment change. Verified paperwork to be sent to [facility]. Clerk's fax [phone]. Paper work faxed approximately [phone]

## 2016-05-08 NOTE — Progress Notes (Signed)
Entered in d/c instructions  Please use the resources provided to you in emergency room by case manager to assist with doctor for follow up These [place] uninsured resources provide possible primary care providers, resources for discounted medications, housing, dental resources, affordable care act information, plus other resources for [place]   

## 2016-05-08 NOTE — Progress Notes (Signed)
CM spoke with pt who confirms uninsured [employer] resident with no pcp.  CM discussed and provided written information to assist pt with determining choice for uninsured accepting pcps, discussed the importance of pcp vs EDP services for f/u care, www.needymeds.org, www.goodrx.com, discounted pharmacies and other [employer] such as [facility] , [employer], affordable care act, financial assistance, uninsured dental services, [place] med assist, DSS and  health department  Reviewed resources for [employer] uninsured accepting pcps like [facility], family medicine at [facility], [facility], [facility], local urgent care centers, [facility], [facility] family practice, general medical clinics, family services of the [place], [facility] urgent care plus others, medication resources, [facility] out patient pharmacies and housing Pt voiced understanding and appreciation of resources provided   Provided P4CC contact information This pt has been seen by [facility] before  Pt is non compliant in following up with pcp Pt confirms with ED Cm that he goes to "[place] to see a psychiatrist"

## 2016-05-08 NOTE — BH Assessment (Signed)
Assessment completed.  Consulted with [name], PA-C who recommends patient be observed overnight and evaluated in the morning by psychiatry.   Davina Poke, LCSW Therapeutic Triage Specialist [facility] 05/08/2016 2:00 AM
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
## 2016-05-09 ENCOUNTER — Emergency Department (HOSPITAL_COMMUNITY)
Admission: EM | Admit: 2016-05-09 | Discharge: 2016-05-11 | Disposition: A | Discharge status: Home / Self Care | Payer: Federal, State, Local not specified - Other

## 2016-05-09 ENCOUNTER — Encounter (HOSPITAL_COMMUNITY): Payer: Self-pay

## 2016-05-09 DIAGNOSIS — F17210 Nicotine dependence, cigarettes, uncomplicated: Secondary | ICD-10-CM | POA: Insufficient documentation

## 2016-05-09 DIAGNOSIS — Z79899 Other long term (current) drug therapy: Secondary | ICD-10-CM | POA: Insufficient documentation

## 2016-05-09 DIAGNOSIS — F1110 Opioid abuse, uncomplicated: Secondary | ICD-10-CM | POA: Insufficient documentation

## 2016-05-09 DIAGNOSIS — T1491XA Suicide attempt, initial encounter: Secondary | ICD-10-CM

## 2016-05-09 LAB — CBC
HCT: 43.5 % (ref 39.0–52.0)
Hemoglobin: 14.6 g/dL (ref 13.0–17.0)
MCH: 31.2 pg (ref 26.0–34.0)
MCHC: 33.6 g/dL (ref 30.0–36.0)
MCV: 92.9 fL (ref 78.0–100.0)
PLATELETS: 291 10*3/uL (ref 150–400)
RBC: 4.68 MIL/uL (ref 4.22–5.81)
RDW: 13.8 % (ref 11.5–15.5)
WBC: 9.1 10*3/uL (ref 4.0–10.5)

## 2016-05-09 LAB — COMPREHENSIVE METABOLIC PANEL
ALBUMIN: 4.6 g/dL (ref 3.5–5.0)
ALT: 33 U/L (ref 17–63)
ANION GAP: 7 (ref 5–15)
AST: 20 U/L (ref 15–41)
Alkaline Phosphatase: 60 U/L (ref 38–126)
BILIRUBIN TOTAL: 0.5 mg/dL (ref 0.3–1.2)
BUN: 12 mg/dL (ref 6–20)
CHLORIDE: 102 mmol/L (ref 101–111)
CO2: 28 mmol/L (ref 22–32)
Calcium: 9.4 mg/dL (ref 8.9–10.3)
Creatinine, Ser: 0.94 mg/dL (ref 0.61–1.24)
GFR calc Af Amer: >60 mL/min (ref >60)
Glucose, Bld: 89 mg/dL (ref 65–99)
POTASSIUM: 3.8 mmol/L (ref 3.5–5.1)
Sodium: 137 mmol/L (ref 135–145)
TOTAL PROTEIN: 7.7 g/dL (ref 6.5–8.1)

## 2016-05-09 LAB — SALICYLATE LEVEL: Salicylate Lvl: <4 mg/dL (ref 2.8–30.0)

## 2016-05-09 LAB — ETHANOL

## 2016-05-09 LAB — RAPID URINE DRUG SCREEN, HOSP PERFORMED
AMPHETAMINES: NOT DETECTED
BENZODIAZEPINES: POSITIVE — AB
Barbiturates: NOT DETECTED
COCAINE: POSITIVE — AB
OPIATES: NOT DETECTED
Tetrahydrocannabinol: POSITIVE — AB

## 2016-05-09 LAB — ACETAMINOPHEN LEVEL

## 2016-05-09 MED ORDER — HYDROXYZINE HCL 25 MG PO TABS
50.0000 mg | ORAL_TABLET | Freq: Four times a day (QID) | ORAL | Status: DC | PRN
  Administered 2016-05-09 – 2016-05-10 (×2): 50 mg via ORAL
  Filled 2016-05-09 (×2): qty 2

## 2016-05-09 MED ORDER — GABAPENTIN 400 MG PO CAPS
400.0000 mg | ORAL_CAPSULE | Freq: Three times a day (TID) | ORAL | Status: DC
  Filled 2016-05-09: qty 1

## 2016-05-09 MED ORDER — QUETIAPINE FUMARATE 100 MG PO TABS
200.0000 mg | ORAL_TABLET | Freq: Every day | ORAL | Status: DC
  Administered 2016-05-09: 200 mg via ORAL
  Filled 2016-05-09: qty 2

## 2016-05-09 MED ORDER — ACETAMINOPHEN 325 MG PO TABS: 650.0000 mg | ORAL_TABLET | ORAL | Status: DC | PRN

## 2016-05-09 MED ORDER — IBUPROFEN 200 MG PO TABS
600.0000 mg | ORAL_TABLET | Freq: Three times a day (TID) | ORAL | Status: DC | PRN
  Administered 2016-05-10: 600 mg via ORAL
  Filled 2016-05-09: qty 3

## 2016-05-09 MED ORDER — PANTOPRAZOLE SODIUM 20 MG PO TBEC
20.0000 mg | DELAYED_RELEASE_TABLET | Freq: Every day | ORAL | Status: DC
  Administered 2016-05-10 – 2016-05-11 (×2): 20 mg via ORAL
  Filled 2016-05-09 (×3): qty 1

## 2016-05-09 MED ORDER — NICOTINE 21 MG/24HR TD PT24
21.0000 mg | MEDICATED_PATCH | Freq: Once | TRANSDERMAL | Status: AC
  Administered 2016-05-09 – 2016-05-10 (×2): 21 mg via TRANSDERMAL
  Filled 2016-05-09: qty 1

## 2016-05-09 MED ORDER — ZOLPIDEM TARTRATE 5 MG PO TABS: 5.0000 mg | ORAL_TABLET | Freq: Every evening | ORAL | Status: DC | PRN

## 2016-05-09 MED ORDER — QUETIAPINE FUMARATE 50 MG PO TABS
50.0000 mg | ORAL_TABLET | Freq: Every morning | ORAL | Status: DC
  Administered 2016-05-09 – 2016-05-10 (×2): 50 mg via ORAL
  Filled 2016-05-09 (×2): qty 1

## 2016-05-09 MED ORDER — ONDANSETRON 4 MG PO TBDP
4.0000 mg | ORAL_TABLET | ORAL | Status: DC | PRN
  Administered 2016-05-09: 4 mg via ORAL
  Filled 2016-05-09: qty 1

## 2016-05-09 NOTE — Progress Notes (Signed)
05/09/16 1406:  LRT went to pt room to offer activities.  Pt stated he wasn't feeling good and refused activities.   Caroll Rancher, LRT/CTRS

## 2016-05-09 NOTE — ED Notes (Signed)
Administered prn regimen for nausea.

## 2016-05-09 NOTE — ED Notes (Signed)
Patient A/O. Patient notes he is SI, but will contract to safety. He c/o nausea and restless BLE. Staff will continue to monitor, maintain safety, and meet needs.

## 2016-05-09 NOTE — ED Notes (Signed)
Bed: [facility] Expected date:  Expected time:  Means of arrival:  Comments: 

## 2016-05-09 NOTE — ED Provider Notes (Signed)
[facility]-EMERGENCY DEPT Provider Note   CSN: [id number] Arrival date & time: 05/09/16  1235     History   Chief Complaint Chief Complaint  Patient presents with  . Medical Clearance  . Suicidal   Level V caveat due to psychiatric disorder. HPI Steven Barker is a 30 y.o. male.  HPI Patient was brought in under involuntary commitment. Reportedly has had suicidal thoughts. Reportedly has threatened family members. Reportedly attempted to hang himself according to the IVC paperwork. Discharged yesterday at noon stating he was no longer a risk to himself. Patient states that he had lied to them to get out here. Patient states he last snorted heroin yesterday.   Past Medical History:  Diagnosis Date  . Anxiety   . Bipolar disorder (HCC)   . Depression   . PTSD (post-traumatic stress disorder)   . Rheumatoid arthritis(714.0)     Patient Active Problem List   Diagnosis Date Noted  . Depression   . Benzodiazepine dependence (HCC)   . Major depressive disorder, recurrent, severe without psychotic features (HCC)   . Substance induced mood disorder (HCC) 11/05/2014  . Suicidal ideation 11/05/2014  . Anxiety state, unspecified 01/01/2014  . MDD (major depressive disorder), recurrent episode, severe (HCC) 12/31/2013  . Rheumatoid arthritis (HCC) 05/17/2013  . Polysubstance abuse 05/17/2013    History reviewed. No pertinent surgical history.     Home Medications    Prior to Admission medications   Medication Sig Start Date End Date Taking? Authorizing Provider  acetaminophen (TYLENOL) 500 MG tablet Take 1,000 mg by mouth every 6 (six) hours as needed for moderate pain or headache.    Historical Provider, MD  doxycycline (VIBRA-TABS) 100 MG tablet Take 1 tablet (100 mg total) by mouth 2 (two) times daily. Patient not taking: Reported on 05/07/2016 03/28/16   Charm Rings, NP  gabapentin (NEURONTIN) 400 MG capsule Take 1 capsule (400 mg total) by mouth 3 (three) times  daily. Patient taking differently: Take 400 mg by mouth 4 (four) times daily.  02/17/16   Jerre Simon, PA  hydrOXYzine (ATARAX/VISTARIL) 50 MG tablet Take 1 tablet (50 mg total) by mouth every 6 (six) hours as needed for anxiety. Patient not taking: Reported on 05/07/2016 04/24/15   Fuller Plan, MD  pantoprazole (PROTONIX) 20 MG tablet Take 1 tablet (20 mg total) by mouth daily. Patient not taking: Reported on 05/07/2016 02/17/16   Jerre Simon, PA  promethazine (PHENERGAN) 12.5 MG tablet Take 1 tablet (12.5 mg total) by mouth every 6 (six) hours as needed for nausea or vomiting. Patient not taking: Reported on 05/07/2016 02/17/16   Jerre Simon, PA  promethazine (PHENERGAN) 25 MG tablet Take 12.5 mg by mouth every 6 (six) hours as needed for nausea/vomiting. 02/18/16   Historical Provider, MD  QUEtiapine (SEROQUEL) 300 MG tablet Take 1 tablet (300 mg total) by mouth at bedtime. Patient taking differently: Take 150-300 mg by mouth 2 (two) times daily. Takes 150mg  in am and 300mg  in pm 04/24/15   , MD    Family History History reviewed. No pertinent family history.  Social History Social History  Substance Use Topics  . Smoking status: Current Every Day Smoker    Packs/day: 0.50    Years: 10.00    Types: Cigarettes  . Smokeless tobacco: Not on file  . Alcohol use Yes     Comment: occasionally     Allergies   Clindamycin/lincomycin   Review of Systems Review of Systems  Unable to perform ROS: Psychiatric disorder  Psychiatric/Behavioral: The patient is not hyperactive.      Physical Exam Updated Vital Signs BP 118/99 (BP Location: Left Arm)   Pulse 67   Temp 97.9 F (36.6 C) (Oral)   Resp 16   SpO2 97%   Physical Exam  Constitutional: He appears well-developed and well-nourished.  HENT:  Head: Atraumatic.  Eyes: Pupils are equal, round, and reactive to light.  Neck: Neck supple.  Cardiovascular: Normal rate.   Pulmonary/Chest: Effort normal.   Abdominal: Soft.  Musculoskeletal: Normal range of motion. He exhibits no edema.  Neurological: He is alert.  Skin: Skin is warm.  Psychiatric:  Patient appears somewhat angry and pressured.     ED Treatments / Results  Labs (all labs ordered are listed, but only abnormal results are displayed) Labs Reviewed  COMPREHENSIVE METABOLIC PANEL  ETHANOL  SALICYLATE LEVEL  ACETAMINOPHEN LEVEL  CBC  URINE RAPID DRUG SCREEN, HOSP PERFORMED    EKG  EKG Interpretation None       Radiology No results found.  Procedures Procedures (including critical care time)  Medications Ordered in ED Medications  acetaminophen (TYLENOL) tablet 650 mg (not administered)  ibuprofen (ADVIL,MOTRIN) tablet 600 mg (not administered)  zolpidem (AMBIEN) tablet 5 mg (not administered)  gabapentin (NEURONTIN) capsule 400 mg (not administered)  hydrOXYzine (ATARAX/VISTARIL) tablet 50 mg (not administered)  pantoprazole (PROTONIX) EC tablet 20 mg (not administered)  QUEtiapine (SEROQUEL) tablet 50 mg (not administered)  QUEtiapine (SEROQUEL) tablet 200 mg (not administered)     Initial Impression / Assessment and Plan / ED Course  I have reviewed the triage vital signs and the nursing notes.  Pertinent labs & imaging results that were available during my care of the patient were reviewed by me and considered in my medical decision making (see chart for details).  Clinical Course    Patient brought in under IVC after suicide attempt. Seen yesterday for depression and discharged. Labs from yesterday reviewed. Cleared medically at this time. To be seen by TTS.  Final Clinical Impressions(s) / ED Diagnoses   Final diagnoses:  Suicide attempt [facility])  Heroin abuse    New Prescriptions New Prescriptions   No medications on file     Benjiman Core, MD 05/09/16 1321
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
## 2016-05-09 NOTE — [facility] Suicide Risk Assessment (Signed)
Suicide Risk Assessment  Discharge Assessment   [facility] Discharge Suicide Risk Assessment   Principal Problem: Major depressive disorder, recurrent, severe without psychotic features [facility]) Discharge Diagnoses:  Patient Active Problem List   Diagnosis Date Noted  . Major depressive disorder, recurrent, severe without psychotic features (HCC) [F33.2]     Priority: High  . Substance induced mood disorder (HCC) [F19.94] 11/05/2014    Priority: High  . Suicidal ideation [R45.851] 11/05/2014    Priority: High  . Polysubstance abuse [F19.10] 05/17/2013    Priority: High  . Depression [F32.9]   . Benzodiazepine dependence (HCC) [F13.20]   . Anxiety state, unspecified [F41.1] 01/01/2014  . MDD (major depressive disorder), recurrent episode, severe (HCC) [F33.2] 12/31/2013  . Rheumatoid arthritis (HCC) [M06.9] 05/17/2013    Total Time spent with patient: 45 minutes  Musculoskeletal: Strength & Muscle Tone: within normal limits Gait & Station: normal Patient leans: N/A  Psychiatric Specialty Exam: Physical Exam  Constitutional: He is oriented to person, place, and time. He appears well-developed and well-nourished.  HENT:  Head: Normocephalic.  Neck: Normal range of motion.  Respiratory: Effort normal.  Musculoskeletal: Normal range of motion.  Neurological: He is oriented to person, place, and time.  Skin: Skin is warm and dry.  Psychiatric: His speech is normal. Judgment normal. Cognition and memory are normal. He exhibits a depressed mood. He expresses suicidal ideation. He expresses suicidal plans.    Review of Systems  Constitutional: Negative.   HENT: Negative.   Eyes: Negative.   Respiratory: Negative.   Cardiovascular: Negative.   Gastrointestinal: Negative.   Genitourinary: Negative.   Musculoskeletal: Negative.   Skin: Negative.   Neurological: Negative.   Endo/Heme/Allergies: Negative.   Psychiatric/Behavioral: Positive for depression and suicidal ideas.    Blood  pressure (!) 98/45, pulse (!) 52, temperature 97.6 F (36.4 C), temperature source Oral, resp. rate 16, SpO2 100 %.There is no height or weight on file to calculate BMI.  General Appearance: Casual  Eye Contact:  Fair  Speech:  Normal Rate  Volume:  Decreased  Mood:  Depressed,mild  Affect:  Congruent  Thought Process:  Coherent and Descriptions of Associations: Intact  Orientation:  Full (Time, Place, and Person)  Thought Content:  Rumination  Suicidal Thoughts:  No  Homicidal Thoughts:  No  Memory:  Immediate;   Fair Recent;   Fair Remote;   Fair  Judgement:  Fair  Insight:  Fair  Psychomotor Activity:  Decreased  Concentration:  Concentration: Fair and Attention Span: Fair  Recall:  [employer] of Knowledge:  Fair  Language:  Fair  Akathisia:  No  Handed:  Right  AIMS (if indicated):     Assets:  Housing Leisure Time Physical Health Resilience Social Support  ADL's:  Intact  Cognition:  WNL  Sleep:       Mental Status Per Nursing Assessment::   On Admission:   suicidal ideations  Demographic Factors:  Male and Caucasian  Loss Factors: Legal issues  Historical Factors: NA  Risk Reduction Factors:   Responsible for children under 58 years of age, Sense of responsibility to family, Living with another person, especially a relative, Positive social support and Positive therapeutic relationship  Continued Clinical Symptoms:  Depression, mild  Cognitive Features That Contribute To Risk:  None    Suicide Risk:  Minimal: No identifiable suicidal ideation.  Patients presenting with no risk factors but with morbid ruminations; may be classified as minimal risk based on the severity of the depressive symptoms  Follow-up Information    Please use the resources provided to you in emergency room by case manager to assist with doctor for follow up .   Contact information: These [place] uninsured resources provide possible primary care providers, resources for  discounted medications, housing, dental resources, affordable care act information, plus other resources for [employer] [facility]/Follow-up recommendations:  Activity:  as tolerated Diet:  heart healthy diet  Hasten Sweitzer, NP 05/09/2016, 8:46 AM

## 2016-05-09 NOTE — ED Notes (Signed)
Pt continues to feel suicidal and said that he would have killed himself if the police had not found him. He said that they found him in a hole in the woods. At present his is resting in bed.
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
## 2016-05-10 DIAGNOSIS — F329 Major depressive disorder, single episode, unspecified: Secondary | ICD-10-CM | POA: Diagnosis not present

## 2016-05-10 DIAGNOSIS — F11229 Opioid dependence with intoxication, unspecified: Secondary | ICD-10-CM

## 2016-05-10 MED ORDER — CITALOPRAM HYDROBROMIDE 10 MG PO TABS
10.0000 mg | ORAL_TABLET | Freq: Every day | ORAL | Status: DC
  Administered 2016-05-11: 10 mg via ORAL
  Filled 2016-05-10: qty 1

## 2016-05-10 MED ORDER — QUETIAPINE FUMARATE 100 MG PO TABS
100.0000 mg | ORAL_TABLET | Freq: Every morning | ORAL | Status: DC
  Administered 2016-05-11: 100 mg via ORAL
  Filled 2016-05-10: qty 1

## 2016-05-10 MED ORDER — GABAPENTIN 100 MG PO CAPS
200.0000 mg | ORAL_CAPSULE | Freq: Two times a day (BID) | ORAL | Status: DC
  Administered 2016-05-10 – 2016-05-11 (×3): 200 mg via ORAL
  Filled 2016-05-10 (×3): qty 2

## 2016-05-10 MED ORDER — QUETIAPINE FUMARATE 300 MG PO TABS
300.0000 mg | ORAL_TABLET | Freq: Every day | ORAL | Status: DC
  Administered 2016-05-10: 300 mg via ORAL
  Filled 2016-05-10: qty 1

## 2016-05-10 MED ORDER — NICOTINE 21 MG/24HR TD PT24
MEDICATED_PATCH | TRANSDERMAL | Status: AC
  Administered 2016-05-10: 21 mg via TRANSDERMAL
  Filled 2016-05-10: qty 1

## 2016-05-10 NOTE — Progress Notes (Signed)
05/10/16 1350:  LRT went to pt room to do activities.  LRT and pt played spades.  Pt was bright and engaged.  Doctor came in and talked to pt.  Pt became upset but composed when he didn't get the answer he wanted and stated he didn't want to play anymore.  Caroll Rancher, LRT/CTRS

## 2016-05-10 NOTE — ED Notes (Signed)
C/O not being able to sleep. Observational rounds patient was noted lying in bed with eyes closed.
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
## 2016-05-10 NOTE — ED Notes (Signed)
Pt has been irritable and non-compliant.  When he finally sat up to speak to [place] he demanded to be either sent to [facility] or go home.  He states "I am not suicidal, I am not going to hurt myself"  "This place makes me worse"  15 minute checks and video monitoring continue.

## 2016-05-10 NOTE — ED Notes (Signed)
Refused VS

## 2016-05-10 NOTE — Progress Notes (Signed)
Pt. Is very med seeking.  Primary school teacher for different medications stating he has not slept in days and will start seeing things if we do not give him what he needs.  Writer had already told pt. He was getting neurontin and seroquel but he wanted more.  Pt. Did complain of a headache which he received ibuprofen for relief.  Pt. Is presently resting quietlyl.

## 2016-05-10 NOTE — BHH Counselor (Signed)
Dr Jama Flavors completed First Opinion. Writer placed it in pt's chart.   Evette Cristal, [place] Therapeutic Triage Specialist
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
## 2016-05-11 MED ORDER — QUETIAPINE FUMARATE 100 MG PO TABS: 100.0000 mg | ORAL_TABLET | Freq: Every morning | ORAL | 0 refills | Status: AC

## 2016-05-11 MED ORDER — GABAPENTIN 100 MG PO CAPS: 200.0000 mg | ORAL_CAPSULE | Freq: Two times a day (BID) | ORAL | 0 refills | Status: AC

## 2016-05-11 MED ORDER — HYDROXYZINE HCL 50 MG PO TABS: 50.0000 mg | ORAL_TABLET | Freq: Four times a day (QID) | ORAL | 0 refills | Status: AC | PRN

## 2016-05-11 MED ORDER — QUETIAPINE FUMARATE 300 MG PO TABS: 300.0000 mg | ORAL_TABLET | Freq: Every day | ORAL | 0 refills | Status: AC

## 2016-05-11 MED ORDER — CITALOPRAM HYDROBROMIDE 10 MG PO TABS: 10.0000 mg | ORAL_TABLET | Freq: Every day | ORAL | 0 refills | Status: AC

## 2016-05-11 MED ORDER — NICOTINE 21 MG/24HR TD PT24
21.0000 mg | MEDICATED_PATCH | Freq: Every day | TRANSDERMAL | Status: DC
  Administered 2016-05-11: 21 mg via TRANSDERMAL
  Filled 2016-05-11: qty 1

## 2016-05-11 NOTE — ED Notes (Signed)
Patient refused vitals Steven Barker made aware

## 2016-05-11 NOTE — ED Notes (Signed)
Patient has been observed by writer jogging around in his room at high speed to keep himself awake

## 2016-05-11 NOTE — ED Notes (Signed)
Pt has been excited to leave today and asking very frequently if his discharge is ready. Denies SI/HI/AVH. Pt does not appear to be delusional and is not responding to internal stimuli. His affect is bright and his behavior has been cooperative. All belongings returned to pt who signed for same. Pt escorted to the exit by staff and pt was given a bus pass. He knows to follow up with [facility] and knows where [place] is.

## 2016-05-11 NOTE — ED Notes (Signed)
Pt insists that he is ready to go home and said that Dr. Jama Flavors told him that he can go home today. Denies SI/HI, AVH. He does not appear to be delusional and is not responding to internal stimuli. Behavior is attention seeking but overall cooperative.

## 2016-05-11 NOTE — Progress Notes (Signed)
Date: 05/11/16 Time: 1345 Location: [facility]  Group Topic: Communication  Goal Area(s) Addresses:  Patient will effectively communicate with peers in group.  Patient will verbalize benefit of healthy communication. Patient will verbalize positive effect of healthy communication on post d/c goals.  Patient will identify communication techniques that made activity effective for group.   Behavioral Response:  Engaged  Intervention: [employer]  Activity: Volleyball.  Patients were to toss the ball back and forth as many times as possible without letting the ball hit the ground and come to a stop.  Education: Communication, Discharge Planning  Education Outcome: Acknowledges understanding/In group clarification offered/Needs additional education.   Clinical Observations/Feedback:  Pt appeared bright.  Pt was engaged and socializing with peers.  Pt stopped playing when he became hot and tired.    Caroll Rancher, LRT/CTRS

## 2016-05-11 NOTE — [facility] Suicide Risk Assessment (Cosign Needed)
Suicide Risk Assessment  Discharge Assessment   [facility] Discharge Suicide Risk Assessment   Principal Problem: <principal problem not specified> Discharge Diagnoses:  Patient Active Problem List   Diagnosis Date Noted  . Depression [F32.9]   . Benzodiazepine dependence (HCC) [F13.20]   . Major depressive disorder, recurrent, severe without psychotic features ([facility]) [F33.2]   . Substance induced mood disorder ([facility]) [F19.94] 11/05/2014  . Suicidal ideation [R45.851] 11/05/2014  . Anxiety state, unspecified [F41.1] 01/01/2014  . MDD (major depressive disorder), recurrent episode, severe (HCC) [F33.2] 12/31/2013  . Rheumatoid arthritis ([facility]) [M06.9] 05/17/2013  . Polysubstance abuse [F19.10] 05/17/2013    Total Time spent with patient: 45 minutes  Musculoskeletal: Strength & Muscle Tone: within normal limits Gait & Station: normal Patient leans: N/A  Psychiatric Specialty Exam: Physical Exam  Constitutional: He is oriented to person, place, and time. He appears well-developed and well-nourished.  HENT:  Head: Normocephalic.  Neck: Normal range of motion.  Respiratory: Effort normal.  Musculoskeletal: Normal range of motion.  Neurological: He is oriented to person, place, and time.  Skin: Skin is warm and dry.  Psychiatric: His speech is normal. Judgment normal. Cognition and memory are normal. He exhibits a depressed mood. He expresses suicidal ideation. He expresses suicidal plans.    Review of Systems  Constitutional: Negative.   HENT: Negative.   Eyes: Negative.   Respiratory: Negative.   Cardiovascular: Negative.   Gastrointestinal: Negative.   Genitourinary: Negative.   Musculoskeletal: Negative.   Skin: Negative.   Neurological: Negative.   Endo/Heme/Allergies: Negative.   Psychiatric/Behavioral: Positive for depression and suicidal ideas.    Blood pressure (!) 98/45, pulse (!) 52, temperature 97.6 F (36.4 C), temperature source Oral, resp. rate 16, SpO2 100  %.There is no height or weight on file to calculate BMI.  General Appearance: Casual  Eye Contact:  Fair  Speech:  Normal Rate  Volume:  Decreased  Mood:  Depressed,mild  Affect:  Congruent  Thought Process:  Coherent and Descriptions of Associations: Intact  Orientation:  Full (Time, Place, and Person)  Thought Content:  Rumination  Suicidal Thoughts:  No  Homicidal Thoughts:  No  Memory:  Immediate;   Fair Recent;   Fair Remote;   Fair  Judgement:  Fair  Insight:  Fair  Psychomotor Activity:  Decreased  Concentration:  Concentration: Fair and Attention Span: Fair  Recall:  [employer] of Knowledge:  Fair  Language:  Fair  Akathisia:  No  Handed:  Right  AIMS (if indicated):     Assets:  Housing Leisure Time Physical Health Resilience Social Support  ADL's:  Intact  Cognition:  WNL  Sleep:       Mental Status Per Nursing Assessment::   On Admission:   suicidal ideations  Demographic Factors:  Male and Caucasian  Loss Factors: Legal issues  Historical Factors: NA  Risk Reduction Factors:   Responsible for children under 70 years of age, Sense of responsibility to family, Living with another person, especially a relative, Positive social support and Positive therapeutic relationship  Continued Clinical Symptoms:  Depression, mild  Cognitive Features That Contribute To Risk:  None    Suicide Risk:  Minimal: No identifiable suicidal ideation.  Patients presenting with no risk factors but with morbid ruminations; may be classified as minimal risk based on the severity of the depressive symptoms  Plan Of Care/Follow-up recommendations:  Activity:  as tolerated Diet:  heart healthy diet  Outpatient to [facility] walk in  [place] May Atiba Kimberlin,  NP [facility] 05/11/2016, 4:03 PM

## 2016-05-11 NOTE — Progress Notes (Addendum)
CSW provided outpatient resource to patient for [facility] with address and contact number. He stated he is seen at [facility] and he will follow-up on Monday for an appointment he missed due to being hospitalized. Bus pass given to Nurse for transportation.   Elenore Paddy [phone] ED CSW 05/11/2016 2:51 PM
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
